# Patient Record
Sex: Female | Born: 1975 | Race: White | Hispanic: No | Marital: Single | Smoking: Current some day smoker
Health system: Southern US, Community
[De-identification: ages and names within clinical notes are randomized; demographics above are authoritative.]

## PROBLEM LIST (undated history)

## (undated) ENCOUNTER — Inpatient Hospital Stay (HOSPITAL_COMMUNITY): Payer: Self-pay

## (undated) DIAGNOSIS — R4586 Emotional lability: Secondary | ICD-10-CM

## (undated) DIAGNOSIS — Z9884 Bariatric surgery status: Secondary | ICD-10-CM

## (undated) DIAGNOSIS — R569 Unspecified convulsions: Secondary | ICD-10-CM

## (undated) DIAGNOSIS — I10 Essential (primary) hypertension: Secondary | ICD-10-CM

## (undated) DIAGNOSIS — S82899A Other fracture of unspecified lower leg, initial encounter for closed fracture: Secondary | ICD-10-CM

## (undated) DIAGNOSIS — F419 Anxiety disorder, unspecified: Secondary | ICD-10-CM

## (undated) DIAGNOSIS — Z5189 Encounter for other specified aftercare: Secondary | ICD-10-CM

## (undated) HISTORY — DX: Anxiety disorder, unspecified: F41.9

## (undated) HISTORY — DX: Emotional lability: R45.86

## (undated) HISTORY — PX: GASTRIC BYPASS: SHX52

## (undated) HISTORY — PX: WISDOM TOOTH EXTRACTION: SHX21

---

## 1998-08-01 ENCOUNTER — Other Ambulatory Visit: Admission: RE | Admit: 1998-08-01 | Discharge: 1998-08-01 | Payer: Self-pay | Admitting: Obstetrics and Gynecology

## 1998-11-03 ENCOUNTER — Inpatient Hospital Stay (HOSPITAL_COMMUNITY): Admission: AD | Admit: 1998-11-03 | Discharge: 1998-11-03 | Payer: Self-pay | Admitting: Obstetrics and Gynecology

## 1998-11-03 ENCOUNTER — Encounter: Payer: Self-pay | Admitting: Obstetrics and Gynecology

## 1998-11-30 ENCOUNTER — Inpatient Hospital Stay (HOSPITAL_COMMUNITY): Admission: AD | Admit: 1998-11-30 | Discharge: 1998-11-30 | Payer: Self-pay | Admitting: Obstetrics and Gynecology

## 1999-02-22 ENCOUNTER — Inpatient Hospital Stay (HOSPITAL_COMMUNITY): Admission: AD | Admit: 1999-02-22 | Discharge: 1999-02-25 | Payer: Self-pay | Admitting: Obstetrics and Gynecology

## 1999-11-14 ENCOUNTER — Emergency Department (HOSPITAL_COMMUNITY): Admission: EM | Admit: 1999-11-14 | Discharge: 1999-11-14 | Payer: Self-pay | Admitting: Emergency Medicine

## 2000-02-08 ENCOUNTER — Other Ambulatory Visit: Admission: RE | Admit: 2000-02-08 | Discharge: 2000-02-08 | Payer: Self-pay | Admitting: Obstetrics and Gynecology

## 2000-02-17 ENCOUNTER — Emergency Department (HOSPITAL_COMMUNITY): Admission: EM | Admit: 2000-02-17 | Discharge: 2000-02-17 | Payer: Self-pay | Admitting: Emergency Medicine

## 2000-09-09 ENCOUNTER — Emergency Department (HOSPITAL_COMMUNITY): Admission: EM | Admit: 2000-09-09 | Discharge: 2000-09-09 | Payer: Self-pay | Admitting: Emergency Medicine

## 2000-10-03 ENCOUNTER — Emergency Department (HOSPITAL_COMMUNITY): Admission: EM | Admit: 2000-10-03 | Discharge: 2000-10-03 | Payer: Self-pay | Admitting: Emergency Medicine

## 2003-03-25 ENCOUNTER — Other Ambulatory Visit: Admission: RE | Admit: 2003-03-25 | Discharge: 2003-03-25 | Payer: Self-pay | Admitting: Obstetrics and Gynecology

## 2005-06-11 ENCOUNTER — Other Ambulatory Visit: Admission: RE | Admit: 2005-06-11 | Discharge: 2005-06-11 | Payer: Self-pay | Admitting: Obstetrics and Gynecology

## 2013-04-20 ENCOUNTER — Emergency Department (HOSPITAL_COMMUNITY)
Admission: EM | Admit: 2013-04-20 | Discharge: 2013-04-20 | Disposition: A | Discharge status: Home / Self Care | Payer: Self-pay

## 2013-04-20 ENCOUNTER — Emergency Department (HOSPITAL_COMMUNITY)
Admission: EM | Admit: 2013-04-20 | Discharge: 2013-04-20 | Disposition: A | Discharge status: Home / Self Care | Attending: Emergency Medicine | Admitting: Emergency Medicine

## 2013-04-20 ENCOUNTER — Encounter (HOSPITAL_COMMUNITY): Payer: Self-pay | Admitting: Emergency Medicine

## 2013-04-20 DIAGNOSIS — R51 Headache: Secondary | ICD-10-CM | POA: Insufficient documentation

## 2013-04-20 DIAGNOSIS — Z8669 Personal history of other diseases of the nervous system and sense organs: Secondary | ICD-10-CM | POA: Insufficient documentation

## 2013-04-20 DIAGNOSIS — F17200 Nicotine dependence, unspecified, uncomplicated: Secondary | ICD-10-CM | POA: Insufficient documentation

## 2013-04-20 HISTORY — DX: Unspecified convulsions: R56.9

## 2013-04-20 MED ORDER — HYDROCODONE-ACETAMINOPHEN 5-325 MG PO TABS: 1.0000 | ORAL_TABLET | Freq: Four times a day (QID) | ORAL | Status: DC | PRN

## 2013-04-20 NOTE — ED Notes (Signed)
Pt presenting to ed with c/o right side upper toothache pain x 24 hours

## 2013-04-20 NOTE — ED Provider Notes (Signed)
History    This chart was scribed for non-physician practitioner, Lucretia Kern, working with Derwood Kaplan, MD by Donne Anon, ED Scribe. This patient was seen in room WTR8/WTR8 and the patient's care was started at 1625.  CSN: [id number] Arrival date & time 04/20/13  1539  First MD Initiated Contact with Patient 04/20/13 1625     Chief Complaint  Patient presents with  . Dental Pain    The history is provided by the patient. No language interpreter was used.   HPI Comments: Isabella Jenkins is a 37 y.o. female with hx of TMJ and seizures, who presents to the Emergency Department complaining of 2 days of gradual onset, gradually worsening, waxing and waning, right upper dental pain described as throbbing and radiating down into her lower jaw towards her chin. She reports that this pain feels different than her TMJ. The pain is worse when she bites down. Pt denies taking OTC medications at home to improve symptoms. She denies fever, chills, congestion, rhinorrhea or any other pain.  Past Medical History  Diagnosis Date  . Seizures    Past Surgical History  Procedure Laterality Date  . Gastric bypass     No family history on file. History  Substance Use Topics  . Smoking status: Current Some Day Smoker  . Smokeless tobacco: Not on file  . Alcohol Use: No   Review of Systems  Constitutional: Negative for fever and chills.  HENT: Positive for dental problem. Negative for congestion and rhinorrhea.   All other systems reviewed and are negative.    Allergies  Ibuprofen  Home Medications  No current outpatient prescriptions on file.  Triage Vitals; BP 133/86  Pulse 88  Temp(Src) 98.6 F (37 C) (Oral)  Resp 18  SpO2 100%  LMP 04/17/2013  Physical Exam  Nursing note and vitals reviewed. Constitutional: She appears well-developed and well-nourished. No distress.  HENT:  Head: Normocephalic and atraumatic.  Right Ear: Tympanic membrane, external ear and  ear canal normal.  Left Ear: Tympanic membrane, external ear and ear canal normal.  Mouth/Throat: Uvula is midline and oropharynx is clear and moist. Normal dentition. No oropharyngeal exudate.   No TMJ tenderness. Good dentition. No obvious cavities. Tender to palpation over right upper molars. No signs of infection. No trismus  Eyes: Conjunctivae are normal. Pupils are equal, round, and reactive to light.  Neck: Neck supple. No tracheal deviation present.  Cardiovascular: Normal rate.   Pulmonary/Chest: Effort normal. No respiratory distress.  Musculoskeletal: Normal range of motion.  Neurological: She is alert.  Skin: Skin is warm and dry.  Psychiatric: She has a normal mood and affect. Her behavior is normal.    ED Course  Procedures (including critical care time) DIAGNOSTIC STUDIES: Oxygen Saturation is 100% on RA, normal by my interpretation.    COORDINATION OF CARE: 5:10 PM Discussed treatment plan which includes pain medication with pt at bedside and pt agreed to plan.     Labs Reviewed - No data to display No results found.   1. Facial pain     MDM  Pt with right facial pain, hx of TMJ and joint surgeries in the past. No signs of dental infction. No signs of TMJ infection. No trismus. She actually does not have any TMJ tenderness but states that her pain presents differently from typical TMJ symptoms. Will provide pain medications. Pt is from [place], will go back in 2 days, follow up with ENT there.   Filed Vitals:  04/20/13 1604  BP: 133/86  Pulse: 88  Temp: 98.6 F (37 C)  TempSrc: Oral  Resp: 18  SpO2: 100%     I personally performed the services described in this documentation, which was scribed in my presence. The recorded information has been reviewed and is accurate.   Lottie Mussel, PA-C 04/20/13 1728

## 2013-04-21 NOTE — ED Provider Notes (Signed)
Medical screening examination/treatment/procedure(s) were performed by non-physician practitioner and as supervising physician I was immediately available for consultation/collaboration.  Katelin Kutsch, MD 04/21/13 0108 

## 2013-05-15 ENCOUNTER — Encounter (HOSPITAL_COMMUNITY): Payer: Self-pay

## 2013-05-15 ENCOUNTER — Emergency Department (HOSPITAL_COMMUNITY)
Admission: EM | Admit: 2013-05-15 | Discharge: 2013-05-15 | Disposition: A | Discharge status: Home / Self Care | Attending: Emergency Medicine | Admitting: Emergency Medicine

## 2013-05-15 DIAGNOSIS — Z79899 Other long term (current) drug therapy: Secondary | ICD-10-CM | POA: Insufficient documentation

## 2013-05-15 DIAGNOSIS — IMO0002 Reserved for concepts with insufficient information to code with codable children: Secondary | ICD-10-CM | POA: Insufficient documentation

## 2013-05-15 DIAGNOSIS — R569 Unspecified convulsions: Secondary | ICD-10-CM | POA: Insufficient documentation

## 2013-05-15 DIAGNOSIS — M5416 Radiculopathy, lumbar region: Secondary | ICD-10-CM

## 2013-05-15 DIAGNOSIS — R300 Dysuria: Secondary | ICD-10-CM | POA: Insufficient documentation

## 2013-05-15 DIAGNOSIS — F17200 Nicotine dependence, unspecified, uncomplicated: Secondary | ICD-10-CM | POA: Insufficient documentation

## 2013-05-15 DIAGNOSIS — Z3202 Encounter for pregnancy test, result negative: Secondary | ICD-10-CM | POA: Insufficient documentation

## 2013-05-15 DIAGNOSIS — M549 Dorsalgia, unspecified: Secondary | ICD-10-CM | POA: Insufficient documentation

## 2013-05-15 LAB — URINALYSIS, ROUTINE W REFLEX MICROSCOPIC
Bilirubin Urine: NEGATIVE
Glucose, UA: NEGATIVE mg/dL
Ketones, ur: 15 mg/dL — AB
Leukocytes, UA: NEGATIVE
Nitrite: NEGATIVE
Protein, ur: NEGATIVE mg/dL
Specific Gravity, Urine: 1.023 (ref 1.005–1.030)
Urobilinogen, UA: 1 mg/dL (ref 0.0–1.0)
pH: 6 (ref 5.0–8.0)

## 2013-05-15 LAB — URINE MICROSCOPIC-ADD ON

## 2013-05-15 MED ORDER — KETOROLAC TROMETHAMINE 30 MG/ML IJ SOLN
30.0000 mg | Freq: Once | INTRAMUSCULAR | Status: AC
  Administered 2013-05-15: 30 mg via INTRAMUSCULAR
  Filled 2013-05-15: qty 1

## 2013-05-15 MED ORDER — HYDROCODONE-ACETAMINOPHEN 5-325 MG PO TABS: 1.0000 | ORAL_TABLET | ORAL | Status: DC | PRN

## 2013-05-15 MED ORDER — CYCLOBENZAPRINE HCL 10 MG PO TABS: 10.0000 mg | ORAL_TABLET | Freq: Three times a day (TID) | ORAL | Status: DC | PRN

## 2013-05-15 NOTE — ED Provider Notes (Signed)
History    CSN: [id number] Arrival date & time 05/15/13  1478  First MD Initiated Contact with Patient 05/15/13 0053     Chief Complaint  Patient presents with  . Flank Pain   HPI  History provided by the patient. The patient is a 37 year old female with history of gastric bypass surgery and seizure disorder who presents with complaints of gradually worsening right flank and back pain with radiation into her right anterior thigh. Symptoms began earlier today and have been worsening. Patient has been using Tylenol at home without any significant improvement. She denies having similar symptoms previously. No recent injury or trauma. Denies any specific strenuous activity or heavy lifting. Pain and numbness going to the anterior thigh and stops at the knee. She denies any weakness in the foot or lower leg. Denies any rash or skin changes. No recent fever, chills or sweats. No unintentional weight changes. Patient also mentions having occasional pressure and dysuria. Denies any urinary frequency or hematuria. No prior history of kidney stone. Patient is currently menstruating. No nausea no vomiting. No urinary fecal incontinence, no urinary retention or perineal numbness.    Past Medical History  Diagnosis Date  . Seizures    Past Surgical History  Procedure Laterality Date  . Gastric bypass     No family history on file. History  Substance Use Topics  . Smoking status: Current Some Day Smoker  . Smokeless tobacco: Not on file  . Alcohol Use: No   OB History   Grav Para Term Preterm Abortions TAB SAB Ect Mult Living                 Review of Systems  Constitutional: Negative for fever, chills, diaphoresis and unexpected weight change.  Genitourinary: Positive for dysuria and flank pain. Negative for frequency, hematuria, vaginal bleeding, vaginal discharge and menstrual problem.  Musculoskeletal: Positive for back pain.  All other systems reviewed and are  negative.    Allergies  Ibuprofen and Tramadol  Home Medications   Current Outpatient Rx  Name  Route  Sig  Dispense  Refill  . divalproex (DEPAKOTE ER) 500 MG 24 hr tablet   Oral   Take 1,500 mg by mouth every evening.          BP 166/79  Pulse 100  Temp(Src) 98.2 F (36.8 C) (Oral)  Resp 18  SpO2 100%  LMP 05/15/2013 Physical Exam  Nursing note and vitals reviewed. Constitutional: She is oriented to person, place, and time. She appears well-developed and well-nourished. No distress.  HENT:  Head: Normocephalic.  Cardiovascular: Normal rate and regular rhythm.   Pulmonary/Chest: Effort normal and breath sounds normal. No respiratory distress. She has no wheezes. She has no rales.  Abdominal: Soft. She exhibits no distension. There is no tenderness. There is no rebound and no guarding.  No CVA tenderness  Musculoskeletal: Normal range of motion. She exhibits no edema.       Cervical back: Normal.       Thoracic back: Normal.       Lumbar back: Normal. She exhibits no tenderness.  Mild tenderness over the anterior right thigh area. No gross deformity. Skin normal. Normal full range of motion of hip and knee. Normal dorsal pedal pulses. Normal sensation in foot. Normal strength in the feet and lower legs.  Neurological: She is alert and oriented to person, place, and time.  Skin: Skin is warm and dry. No rash noted.  Psychiatric: She has a normal mood  and affect. Her behavior is normal.    ED Course  Procedures   Results for orders placed during the hospital encounter of 05/15/13  URINALYSIS, ROUTINE W REFLEX MICROSCOPIC      Result Value Range   Color, Urine YELLOW  YELLOW   APPearance CLOUDY (*) CLEAR   Specific Gravity, Urine 1.023  1.005 - 1.030   pH 6.0  5.0 - 8.0   Glucose, UA NEGATIVE  NEGATIVE mg/dL   Hgb urine dipstick MODERATE (*) NEGATIVE   Bilirubin Urine NEGATIVE  NEGATIVE   Ketones, ur 15 (*) NEGATIVE mg/dL   Protein, ur NEGATIVE  NEGATIVE mg/dL    Urobilinogen, UA 1.0  0.0 - 1.0 mg/dL   Nitrite NEGATIVE  NEGATIVE   Leukocytes, UA NEGATIVE  NEGATIVE  URINE MICROSCOPIC-ADD ON      Result Value Range   Squamous Epithelial / LPF RARE  RARE   WBC, UA 3-6  <3 WBC/hpf   RBC / HPF 7-10  <3 RBC/hpf   Bacteria, UA RARE  RARE   Urine-Other MUCOUS PRESENT    POCT PREGNANCY, URINE      Result Value Range   Preg Test, Ur NEGATIVE  NEGATIVE      1. Lumbar radicular pain     MDM  Patient seen and evaluated. Patient appears in mild discomfort no acute distress. No specific injury or trauma.  Patient is currently menstruating. Symptoms appear consistent with kidney stone with radiation to the right side. She has no nausea no vomiting. This time suspect musculoskeletal cause. Patient will be provided resources for outpatient followup. We'll treat symptomatically.   Angus Seller, PA-C 05/15/13 [phone]

## 2013-05-15 NOTE — ED Notes (Signed)
Pt. Reports right flank pain, burning with urination and numbness/tingling down right leg. Pedal pulse intact, sensation intact.

## 2013-05-15 NOTE — ED Provider Notes (Signed)
Medical screening examination/treatment/procedure(s) were performed by non-physician practitioner and as supervising physician I was immediately available for consultation/collaboration.   Brandt Loosen, MD 05/15/13 [phone]

## 2013-08-22 ENCOUNTER — Emergency Department (HOSPITAL_COMMUNITY)
Admission: EM | Admit: 2013-08-22 | Discharge: 2013-08-22 | Disposition: A | Discharge status: Home / Self Care | Attending: Emergency Medicine | Admitting: Emergency Medicine

## 2013-08-22 ENCOUNTER — Encounter (HOSPITAL_COMMUNITY): Payer: Self-pay | Admitting: Emergency Medicine

## 2013-08-22 DIAGNOSIS — Q759 Congenital malformation of skull and face bones, unspecified: Secondary | ICD-10-CM | POA: Insufficient documentation

## 2013-08-22 DIAGNOSIS — K0889 Other specified disorders of teeth and supporting structures: Secondary | ICD-10-CM

## 2013-08-22 DIAGNOSIS — Z760 Encounter for issue of repeat prescription: Secondary | ICD-10-CM | POA: Insufficient documentation

## 2013-08-22 DIAGNOSIS — K029 Dental caries, unspecified: Secondary | ICD-10-CM | POA: Insufficient documentation

## 2013-08-22 DIAGNOSIS — G40909 Epilepsy, unspecified, not intractable, without status epilepticus: Secondary | ICD-10-CM | POA: Insufficient documentation

## 2013-08-22 DIAGNOSIS — K089 Disorder of teeth and supporting structures, unspecified: Secondary | ICD-10-CM | POA: Insufficient documentation

## 2013-08-22 DIAGNOSIS — F17200 Nicotine dependence, unspecified, uncomplicated: Secondary | ICD-10-CM | POA: Insufficient documentation

## 2013-08-22 MED ORDER — HYDROCODONE-ACETAMINOPHEN 5-325 MG PO TABS: ORAL_TABLET | ORAL | Status: DC

## 2013-08-22 MED ORDER — DIVALPROEX SODIUM ER 500 MG PO TB24: 1500.0000 mg | ORAL_TABLET | Freq: Every evening | ORAL | Status: DC

## 2013-08-22 NOTE — ED Notes (Signed)
Patient states recently moved to [facility] and took last dose of anti seizure medication yesterday and would like a refill until patient gets a Librarian, academic in [place].

## 2013-08-22 NOTE — ED Notes (Signed)
Pt reports she was eating chewy candy this am and felt something hard near her back L tooth where she had a filling and now she is having pain in that tooth. Also she ran out of seizure meds yesterday, she just moved here from [facility] and her doctor there was unable to call in a refill for her.

## 2013-08-22 NOTE — ED Provider Notes (Signed)
CSN: [id number]     Arrival date & time 08/22/13  1129 History  This chart was scribed for non-physician practitioner Rhea Bleacher PA-C working with Gerhard Munch, MD by Leone Payor, ED Scribe. This patient was seen in room TR05C/TR05C and the patient's care was started at 1129.    Chief Complaint  Patient presents with  . Dental Pain    The history is provided by the patient. No language interpreter was used.    HPI Comments: Isabella Jenkins is a 37 y.o. female who presents to the Emergency Department complaining of sudden onset, gradually worsening left upper dental pain that began this morning. Pt states she has a filling and was eating a Starburst candy when the filling came out. She states having gradually worsening pain. She has taken tylenol without relief. She denies facial swelling.   Pt also states she had a history of seizures and takes Depakote. She states she recently moved from [place] and has been unable to get a refill for Depakote.  She requests short-term refill and anticipates getting refill from PCP early next week.    Past Medical History  Diagnosis Date  . Seizures    Past Surgical History  Procedure Laterality Date  . Gastric bypass     History reviewed. No pertinent family history. History  Substance Use Topics  . Smoking status: Current Some Day Smoker  . Smokeless tobacco: Not on file  . Alcohol Use: No   OB History   Grav Para Term Preterm Abortions TAB SAB Ect Mult Living                 Review of Systems  Constitutional: Negative for fever.  HENT: Positive for dental problem. Negative for ear pain, facial swelling, sore throat and trouble swallowing.   Respiratory: Negative for shortness of breath and stridor.   Musculoskeletal: Negative for neck pain.  Skin: Negative for color change.  Neurological: Negative for headaches.    Allergies  Ibuprofen and Tramadol  Home Medications   Current Outpatient Rx  Name  Route  Sig  Dispense  Refill   . divalproex (DEPAKOTE ER) 500 MG 24 hr tablet   Oral   Take 1,500 mg by mouth every evening.          BP 150/100  Pulse 108  Temp(Src) 97.7 F (36.5 C) (Oral)  Resp 20  SpO2 100% Physical Exam  Nursing note and vitals reviewed. Constitutional: She appears well-developed and well-nourished. No distress.  HENT:  Head: Atraumatic. Macrocephalic.  Right Ear: Tympanic membrane, external ear and ear canal normal.  Left Ear: Tympanic membrane, external ear and ear canal normal.  Nose: Nose normal.  Mouth/Throat: Uvula is midline, oropharynx is clear and moist and mucous membranes are normal. No trismus in the jaw. Abnormal dentition. Dental caries present. No dental abscesses or uvula swelling. No tonsillar abscesses.  Left upper second molar has filling in place. No obvious defect in tooth. No gum swelling.   Eyes: Conjunctivae and EOM are normal.  Neck: Normal range of motion. Neck supple.  No neck swelling or Ludwig's angina  Pulmonary/Chest: Effort normal.  Musculoskeletal: Normal range of motion.  Lymphadenopathy:    She has no cervical adenopathy.  Neurological: She is alert.  Skin: Skin is warm and dry. No rash noted. She is not diaphoretic.  Psychiatric: She has a normal mood and affect. Her behavior is normal.    ED Course  Procedures   DIAGNOSTIC STUDIES: Oxygen Saturation is 100% on  RA, normal by my interpretation.    COORDINATION OF CARE: 11:48 AM Discussed treatment plan with pt at bedside and pt agreed to plan.    Labs Review Labs Reviewed - No data to display Imaging Review No results found.  EKG Interpretation   None       Patient seen and examined. Work-up initiated.   Vital signs reviewed and are as follows: Filed Vitals:   08/22/13 1133  BP: 150/100  Pulse: 108  Temp: 97.7 F (36.5 C)  Resp: 20    Patient counseled to take prescribed medications as directed, return with worsening facial or neck swelling, and to follow-up with their  dentist as soon as possible.   Patient counseled on use of narcotic pain medications. Counseled not to combine these medications with others containing tylenol. Urged not to drink alcohol, drive, or perform any other activities that requires focus while taking these medications. The patient verbalizes understanding and agrees with the plan.    MDM   1. Pain, dental   2. Medication refill    Patient with toothache.  No gross abscess.  Exam unconcerning for Ludwig's angina or other deep tissue infection in neck.  Will treat with pain medicine.  Urged patient to follow-up with dentist.  Patient provided with one-week course of previously prescribed Depakote to prevent seizures. Pt is concerned that she have a seizure if she misses even one dose.   I personally performed the services described in this documentation, which was scribed in my presence. The recorded information has been reviewed and is accurate.     Renne Crigler, PA-C 08/22/13 1550

## 2013-08-23 NOTE — ED Provider Notes (Signed)
  Medical screening examination/treatment/procedure(s) were performed by non-physician practitioner and as supervising physician I was immediately available for consultation/collaboration.      Gerhard Munch, MD 08/23/13 [phone]

## 2013-08-31 ENCOUNTER — Ambulatory Visit: Admitting: Diagnostic Neuroimaging

## 2013-10-15 ENCOUNTER — Encounter (HOSPITAL_COMMUNITY): Payer: Self-pay | Admitting: Emergency Medicine

## 2013-10-15 ENCOUNTER — Emergency Department (HOSPITAL_COMMUNITY)

## 2013-10-15 ENCOUNTER — Emergency Department (HOSPITAL_COMMUNITY)
Admission: EM | Admit: 2013-10-15 | Discharge: 2013-10-15 | Disposition: A | Discharge status: Home / Self Care | Attending: Emergency Medicine | Admitting: Emergency Medicine

## 2013-10-15 DIAGNOSIS — Z792 Long term (current) use of antibiotics: Secondary | ICD-10-CM | POA: Insufficient documentation

## 2013-10-15 DIAGNOSIS — Y939 Activity, unspecified: Secondary | ICD-10-CM | POA: Insufficient documentation

## 2013-10-15 DIAGNOSIS — S8392XA Sprain of unspecified site of left knee, initial encounter: Secondary | ICD-10-CM

## 2013-10-15 DIAGNOSIS — W1809XA Striking against other object with subsequent fall, initial encounter: Secondary | ICD-10-CM | POA: Insufficient documentation

## 2013-10-15 DIAGNOSIS — Y990 Civilian activity done for income or pay: Secondary | ICD-10-CM | POA: Insufficient documentation

## 2013-10-15 DIAGNOSIS — W11XXXA Fall on and from ladder, initial encounter: Secondary | ICD-10-CM | POA: Insufficient documentation

## 2013-10-15 DIAGNOSIS — G40909 Epilepsy, unspecified, not intractable, without status epilepticus: Secondary | ICD-10-CM | POA: Insufficient documentation

## 2013-10-15 DIAGNOSIS — F17200 Nicotine dependence, unspecified, uncomplicated: Secondary | ICD-10-CM | POA: Insufficient documentation

## 2013-10-15 DIAGNOSIS — IMO0002 Reserved for concepts with insufficient information to code with codable children: Secondary | ICD-10-CM | POA: Insufficient documentation

## 2013-10-15 DIAGNOSIS — Y929 Unspecified place or not applicable: Secondary | ICD-10-CM | POA: Insufficient documentation

## 2013-10-15 DIAGNOSIS — N390 Urinary tract infection, site not specified: Secondary | ICD-10-CM | POA: Insufficient documentation

## 2013-10-15 DIAGNOSIS — Z3202 Encounter for pregnancy test, result negative: Secondary | ICD-10-CM | POA: Insufficient documentation

## 2013-10-15 LAB — CBC WITH DIFFERENTIAL/PLATELET
Basophils Absolute: 0.1 10*3/uL (ref 0.0–0.1)
Basophils Relative: 1 % (ref 0–1)
Eosinophils Absolute: 0.3 10*3/uL (ref 0.0–0.7)
Eosinophils Relative: 4 % (ref 0–5)
MCH: 26.4 pg (ref 26.0–34.0)
MCHC: 32.1 g/dL (ref 30.0–36.0)
MCV: 82.4 fL (ref 78.0–100.0)
Monocytes Absolute: 0.4 10*3/uL (ref 0.1–1.0)
Platelets: 243 10*3/uL (ref 150–400)
RDW: 14.9 % (ref 11.5–15.5)
WBC: 6.4 10*3/uL (ref 4.0–10.5)

## 2013-10-15 LAB — URINALYSIS, ROUTINE W REFLEX MICROSCOPIC
Glucose, UA: NEGATIVE mg/dL
Nitrite: POSITIVE — AB
Protein, ur: 30 mg/dL — AB

## 2013-10-15 LAB — URINE MICROSCOPIC-ADD ON

## 2013-10-15 LAB — PREGNANCY, URINE: Preg Test, Ur: NEGATIVE

## 2013-10-15 MED ORDER — DIVALPROEX SODIUM ER 500 MG PO TB24: ORAL_TABLET | ORAL | Status: DC

## 2013-10-15 MED ORDER — CIPROFLOXACIN HCL 500 MG PO TABS: 500.0000 mg | ORAL_TABLET | Freq: Two times a day (BID) | ORAL | Status: DC

## 2013-10-15 NOTE — ED Notes (Signed)
Reports falling approx 3-4 ft off a ladder at work, having left knee pain. Ambulatory at triage.

## 2013-10-15 NOTE — ED Notes (Signed)
Pt given meal and drink per PA.

## 2013-10-15 NOTE — ED Notes (Signed)
Pt fell from shelving at work today, striking her left knee on shelves as she went down. No deformity. ALSO, pt gives urine specimen that is dark and cloudy. States she has had burning with urination x 2 weeks. Also states she has also had mid to lower back pain.

## 2013-10-15 NOTE — ED Provider Notes (Signed)
CSN: [id number]     Arrival date & time 10/15/13  1118 History  This chart was scribed for non-physician practitioner, Raymon Mutton, PA-C working with Raeford Razor, MD by Greggory Stallion, ED scribe. This patient was seen in room TR11C/TR11C and the patient's care was started at 2:49 PM.   Chief Complaint  Patient presents with  . Fall  . Knee Pain   The history is provided by the patient. No language interpreter was used.   HPI Comments: Isabella Jenkins is a 37 y.o. female who presents to the Emergency Department complaining of a fall that occurred earlier today. She states she fell approximately 3-4 feet off a ladder at work and hit a rack with her left knee. Pt has sudden onset aching left knee pain. Denies radiation. Straightening her leg worsens the pain. Pt states she is having chills, dysuria, back pain and suprapubic pain that started several weeks ago. She has not been seen for the UTI symptoms. Patient reported that the suprapubic pain is a pressure sensation. Stated that she developed these symptoms about a month ago and stated that she has not been seen by a physician secondary to just moving from [place]. Denies fever, chest pain, difficulty breathing, SOB, numbness or tingling in legs, loss of sensation.   Past Medical History  Diagnosis Date  . Seizures    Past Surgical History  Procedure Laterality Date  . Gastric bypass     History reviewed. No pertinent family history. History  Substance Use Topics  . Smoking status: Current Some Day Smoker  . Smokeless tobacco: Not on file  . Alcohol Use: No   OB History   Grav Para Term Preterm Abortions TAB SAB Ect Mult Living                 Review of Systems  Constitutional: Positive for chills. Negative for fever.  Respiratory: Negative for shortness of breath.   Cardiovascular: Negative for chest pain.  Genitourinary: Positive for dysuria and pelvic pain.  Musculoskeletal: Positive for arthralgias and back pain.   Neurological: Negative for numbness.  All other systems reviewed and are negative.    Allergies  Ibuprofen and Tramadol  Home Medications   Current Outpatient Rx  Name  Route  Sig  Dispense  Refill  . acetaminophen (TYLENOL) 325 MG tablet   Oral   Take 650 mg by mouth every 6 (six) hours as needed for mild pain.         . divalproex (DEPAKOTE ER) 500 MG 24 hr tablet   Oral   Take 3 tablets (1,500 mg total) by mouth every evening.   21 tablet   0   . Phenazopyridine HCl (AZO TABS PO)   Oral   Take 2 tablets by mouth daily as needed (bladder pain).         . ciprofloxacin (CIPRO) 500 MG tablet   Oral   Take 1 tablet (500 mg total) by mouth 2 (two) times daily.   14 tablet   0   . divalproex (DEPAKOTE ER) 500 MG 24 hr tablet      Take 3 tablets (1,500 mg total) by mouth every evening   30 tablet   0    BP 139/89  Pulse 90  Temp(Src) 97.3 F (36.3 C) (Oral)  Resp 18  Ht 5\' 4"  (1.626 m)  Wt 165 lb 3 oz (74.929 kg)  BMI 28.34 kg/m2  SpO2 100%  LMP 09/28/2013  Physical Exam  Nursing note and  vitals reviewed. Constitutional: She is oriented to person, place, and time. She appears well-developed and well-nourished. No distress.  HENT:  Head: Normocephalic and atraumatic.  Eyes: EOM are normal.  Neck: Neck supple. No tracheal deviation present.  Cardiovascular: Normal rate, regular rhythm and normal heart sounds.  Exam reveals no gallop and no friction rub.   No murmur heard. Pulmonary/Chest: Effort normal and breath sounds normal. No respiratory distress. She has no wheezes. She has no rales.  Abdominal: Soft. Bowel sounds are normal. There is tenderness. There is no guarding and no CVA tenderness.    Suprapubic tenderness upon palpation   Musculoskeletal: Normal range of motion.  Negative swelling, erythema, inflammation, deformities, ecchymosis noted to the left knee. Pain upon palpation to the left knee circumferential. Discomfort with extension, full  flexion noted. Negative valgus varus tension. Negative anterior posterior drawer sign. Stable left knee joint.   Neurological: She is alert and oriented to person, place, and time. She exhibits normal muscle tone. Coordination normal.  Strength 5+/5+ to bilateral lower extremities with resistance applied and equal distribution. Sensation intact with differentiation to sharp and dull touch to lower extremities bilaterally.   Skin: Skin is warm and dry.  Psychiatric: She has a normal mood and affect. Her behavior is normal.    ED Course  Procedures (including critical care time)  DIAGNOSTIC STUDIES: Oxygen Saturation is 100% on RA, normal by my interpretation.    COORDINATION OF CARE: 2:55 PM-Discussed treatment plan which includes an antibiotic with pt at bedside and pt agreed to plan.   3:02 PM Patient requesting Depakote medications to be refilled. Discussed with patient will need to obtain blood to check for levels to make sure she is not overdosing, patient understood.   Results for orders placed during the hospital encounter of 10/15/13  URINALYSIS, ROUTINE W REFLEX MICROSCOPIC      Result Value Range   Color, Urine AMBER (*) YELLOW   APPearance CLOUDY (*) CLEAR   Specific Gravity, Urine 1.033 (*) 1.005 - 1.030   pH 5.5  5.0 - 8.0   Glucose, UA NEGATIVE  NEGATIVE mg/dL   Hgb urine dipstick NEGATIVE  NEGATIVE   Bilirubin Urine SMALL (*) NEGATIVE   Ketones, ur 15 (*) NEGATIVE mg/dL   Protein, ur 30 (*) NEGATIVE mg/dL   Urobilinogen, UA 1.0  0.0 - 1.0 mg/dL   Nitrite POSITIVE (*) NEGATIVE   Leukocytes, UA MODERATE (*) NEGATIVE  PREGNANCY, URINE      Result Value Range   Preg Test, Ur NEGATIVE  NEGATIVE  URINE MICROSCOPIC-ADD ON      Result Value Range   Squamous Epithelial / LPF MANY (*) RARE   WBC, UA 21-50  <3 WBC/hpf   RBC / HPF 0-2  <3 RBC/hpf   Bacteria, UA MANY (*) RARE   Urine-Other MUCOUS PRESENT    VALPROIC ACID LEVEL      Result Value Range   Valproic Acid Lvl  55.4  50.0 - 100.0 ug/mL  CBC WITH DIFFERENTIAL      Result Value Range   WBC 6.4  4.0 - 10.5 K/uL   RBC 4.20  3.87 - 5.11 MIL/uL   Hemoglobin 11.1 (*) 12.0 - 15.0 g/dL   HCT 40.9 (*) 81.1 - 91.4 %   MCV 82.4  78.0 - 100.0 fL   MCH 26.4  26.0 - 34.0 pg   MCHC 32.1  30.0 - 36.0 g/dL   RDW 78.2  95.6 - 21.3 %   Platelets 243  150 - 400 K/uL   Neutrophils Relative % 48  43 - 77 %   Neutro Abs 3.1  1.7 - 7.7 K/uL   Lymphocytes Relative 40  12 - 46 %   Lymphs Abs 2.6  0.7 - 4.0 K/uL   Monocytes Relative 6  3 - 12 %   Monocytes Absolute 0.4  0.1 - 1.0 K/uL   Eosinophils Relative 4  0 - 5 %   Eosinophils Absolute 0.3  0.0 - 0.7 K/uL   Basophils Relative 1  0 - 1 %   Basophils Absolute 0.1  0.0 - 0.1 K/uL   Dg Knee Complete 4 Views Left  10/15/2013   CLINICAL DATA:  Fall.  EXAM: LEFT KNEE - COMPLETE 4+ VIEW  COMPARISON:  None.  FINDINGS: There is no evidence of fracture, dislocation, or joint effusion. There is no evidence of arthropathy or other focal bone abnormality. Soft tissues are unremarkable.  IMPRESSION: Negative.   Electronically Signed   By: Maisie Fus  Register   On: 10/15/2013 13:07   Labs Review Labs Reviewed  URINALYSIS, ROUTINE W REFLEX MICROSCOPIC - Abnormal; Notable for the following:    Color, Urine AMBER (*)    APPearance CLOUDY (*)    Specific Gravity, Urine 1.033 (*)    Bilirubin Urine SMALL (*)    Ketones, ur 15 (*)    Protein, ur 30 (*)    Nitrite POSITIVE (*)    Leukocytes, UA MODERATE (*)    All other components within normal limits  URINE MICROSCOPIC-ADD ON - Abnormal; Notable for the following:    Squamous Epithelial / LPF MANY (*)    Bacteria, UA MANY (*)    All other components within normal limits  CBC WITH DIFFERENTIAL - Abnormal; Notable for the following:    Hemoglobin 11.1 (*)    HCT 34.6 (*)    All other components within normal limits  URINE CULTURE  PREGNANCY, URINE  VALPROIC ACID LEVEL   Imaging Review Dg Knee Complete 4 Views  Left  10/15/2013   CLINICAL DATA:  Fall.  EXAM: LEFT KNEE - COMPLETE 4+ VIEW  COMPARISON:  None.  FINDINGS: There is no evidence of fracture, dislocation, or joint effusion. There is no evidence of arthropathy or other focal bone abnormality. Soft tissues are unremarkable.  IMPRESSION: Negative.   Electronically Signed   By: Maisie Fus  Register   On: 10/15/2013 13:07    EKG Interpretation   None       MDM   1. UTI (urinary tract infection)   2. Knee sprain, left, initial encounter    Filed Vitals:   10/15/13 1123 10/15/13 1558  BP: 139/89 112/66  Pulse: 90 89  Temp: 97.3 F (36.3 C)   TempSrc: Oral   Resp: 18 18  Height: 5\' 4"  (1.626 m)   Weight: 165 lb 3 oz (74.929 kg)   SpO2: 100% 100%   I personally performed the services described in this documentation, which was scribed in my presence. The recorded information has been reviewed and is accurate.  Patient presenting to the ED with left knee pain after allegedly slipping off the shelving at work and landing on her left knee. Patient reported that she has been having dysuria for one month. Reported that she recently moved from [place] and has not been assessed regarding her dysuria. Patient requesting depakote to be refilled. Alert and oriented. GCS 15. Heart rate and rhythm normal. Lungs clear to auscultation to upper and lower lobes. Pulses palpable and  strong, radial and DP 2+ bilaterally. BS normoactive in all 4 quadrants, soft. Discomfort upon palpation to the suprapubic region. Negative acute abdomen, negative peritoneal signs. Negative swelling, erythema, inflammation, lesions, deformities noted to the left knee. Full flexion, pain with extension. Pain upon palpation circumferential to the left knee. Patella intact. Negative valgus, varus. Negative bilateral CVA tenderness.  CBC negative elevated WBC. Depakote within normal limits. UA noted UTI - positive nitrites with moderate leukocytes, WBC of 21-50. Left knee plain film  negative for fractures or dislocations.  Patient stable, afebrile. Negative elevation in WBC - patient not septic. Discharged patient with depakote and antibiotics. Discussed with patient to rest and stay hydrated. Patient placed in knee sleeve for comfort. Referred patient to Urgent Care Center - discussed with patient that she needs to be re-assessed by Urgent Care center regarding depakote levels to be monitored and controlled. Discussed with patient to continue to monitor symptoms and if symptoms are to worsen or change to report back to the ED -strict return instructions given.  Patient agreed to plan of care, understood, all questions answered.    Raymon Mutton, PA-C 10/15/13 1714

## 2013-10-15 NOTE — ED Notes (Signed)
[facility]le[facility] called to draw labs

## 2013-10-16 NOTE — ED Provider Notes (Signed)
Medical screening examination/treatment/procedure(s) were performed by non-physician practitioner and as supervising physician I was immediately available for consultation/collaboration.  EKG Interpretation   None        Shawndell Varas, MD 10/16/13 0750 

## 2013-10-17 LAB — URINE CULTURE: Colony Count: >=100000

## 2013-10-29 NOTE — L&D Delivery Note (Signed)
Delivery Note Patient pushed for 5 minutes after she was noted to be C /C/ +3. At 9:00 PM a viable and healthy female was delivered via Vaginal, Spontaneous Delivery (Presentation: ; Occiput Anterior).  APGAR: 9/9 weight  pending .   Placenta status: Intact, Spontaneous.  Cord: 2 vessels with the following complications: None.  There was a slow trickle of blood and with her previous h/o post partum hemorrhage, cytotec 1047mcg was placed per rectum and  Her bladder emptied with a red rubber catheter. Her fundus was noted to be firm and hemostasis achieved.   Anesthesia: Epidural  Episiotomy: None Lacerations: None Est. Blood Loss (mL): 400  Mom to postpartum.  Baby to Couplet care / Skin to Skin.  Richerd Grime, [place] 10/25/2014, 9:28 PM

## 2013-11-07 ENCOUNTER — Emergency Department (HOSPITAL_COMMUNITY)
Admission: EM | Admit: 2013-11-07 | Discharge: 2013-11-07 | Disposition: A | Discharge status: Home / Self Care | Attending: Emergency Medicine | Admitting: Emergency Medicine

## 2013-11-07 ENCOUNTER — Encounter (HOSPITAL_COMMUNITY): Payer: Self-pay | Admitting: Emergency Medicine

## 2013-11-07 DIAGNOSIS — Z760 Encounter for issue of repeat prescription: Secondary | ICD-10-CM

## 2013-11-07 DIAGNOSIS — Z79899 Other long term (current) drug therapy: Secondary | ICD-10-CM | POA: Insufficient documentation

## 2013-11-07 DIAGNOSIS — F17200 Nicotine dependence, unspecified, uncomplicated: Secondary | ICD-10-CM | POA: Insufficient documentation

## 2013-11-07 DIAGNOSIS — G40909 Epilepsy, unspecified, not intractable, without status epilepticus: Secondary | ICD-10-CM | POA: Insufficient documentation

## 2013-11-07 MED ORDER — DIVALPROEX SODIUM ER 500 MG PO TB24: 1500.0000 mg | ORAL_TABLET | Freq: Every evening | ORAL | Status: DC

## 2013-11-07 NOTE — Discharge Instructions (Signed)
Read the information below.  Use the prescribed medication as directed.  Please discuss all new medications with your pharmacist.  You may return to the Emergency Department at any time for worsening condition or any new symptoms that concern you.  If there is any possibility that you might be pregnant, please let your health care provider know and discuss this with the pharmacist to ensure medication safety.     Medication Refill, Emergency Department We have refilled your medication today as a courtesy to you. It is best for your medical care, however, to take care of getting refills done through your primary caregiver's office. They have your records and can do a better job of follow-up than we can in the emergency department. On maintenance medications, we often only prescribe enough medications to get you by until you are able to see your regular caregiver. This is a more expensive way to refill medications. In the future, please plan for refills so that you will not have to use the emergency department for this. Thank you for your help. Your help allows [place] to better take care of the daily emergencies that enter our department. Document Released: 02/01/2004 Document Revised: 01/07/2012 Document Reviewed: 10/15/2005 [facility] Patient Information 2014 [place], [place].

## 2013-11-07 NOTE — ED Notes (Signed)
Pt. Stated, i need my medications refill for seizures

## 2013-11-07 NOTE — ED Provider Notes (Signed)
CSN: [id number]     Arrival date & time 11/07/13  1057 History  This chart was scribed for non-physician practitioner, Clayton Bibles, PA-C, working with Veryl Speak, MD by Roe Coombs, ED Scribe. This patient was seen in room TR04C/TR04C and the patient's care was started at 11:50 AM.    Chief Complaint  Patient presents with  . Medication Refill    The history is provided by the patient. No language interpreter was used.    HPI Comments: Isabella Jenkins is a 38 y.o. female with a history of seizures who presents to the Emergency Department for a medication refill on Depakote ER 500 mg. She is supposed to be taking 1500 mg every evening. Last dose was last night. She has not had any seizures since running out of her medication. She denies fever, chills, nausea, vomiting, diarrhea, abdominal pain, and chest pain. She recently moved to the area and has not yet established with a PCP or neurologist. She is a current smoker.   Past Medical History  Diagnosis Date  . Seizures   . Seizure    Past Surgical History  Procedure Laterality Date  . Gastric bypass     No family history on file. History  Substance Use Topics  . Smoking status: Current Some Day Smoker  . Smokeless tobacco: Not on file  . Alcohol Use: No   OB History   Grav Para Term Preterm Abortions TAB SAB Ect Mult Living                 Review of Systems  Constitutional: Negative for fever and chills.  Respiratory: Negative for shortness of breath.   Cardiovascular: Negative for chest pain.  Gastrointestinal: Negative for nausea, vomiting, diarrhea and abdominal distention.  Neurological: Negative for seizures.    Allergies  Ibuprofen and Tramadol  Home Medications   Current Outpatient Rx  Name  Route  Sig  Dispense  Refill  . divalproex (DEPAKOTE ER) 500 MG 24 hr tablet   Oral   Take 3 tablets (1,500 mg total) by mouth every evening.   21 tablet   0    BP 165/102  Pulse 103  Temp(Src) 97.5 F (36.4 C)  (Oral)  Resp 16  Ht 5\' 4"  (1.626 m)  Wt 158 lb 6 oz (71.838 kg)  BMI 27.17 kg/m2  SpO2 100%  LMP 11/04/2013 Physical Exam  Nursing note and vitals reviewed. Constitutional: She appears well-developed and well-nourished. No distress.  HENT:  Head: Normocephalic and atraumatic.  Neck: Neck supple.  Cardiovascular: Normal rate and regular rhythm.   Pulmonary/Chest: Effort normal and breath sounds normal. No respiratory distress. She has no wheezes. She has no rales.  Abdominal: Soft. She exhibits no distension. There is no tenderness. There is no rebound and no guarding.  Neurological: She is alert.  Skin: She is not diaphoretic.    ED Course  Procedures (including critical care time) DIAGNOSTIC STUDIES: Oxygen Saturation is 100% on room air, normal by my interpretation.    COORDINATION OF CARE: 11:56 AM- Patient informed of current plan for treatment and evaluation and agrees with plan at this time.     MDM   1. Medication refill    Patient reports she is new to the area and does not yet have a primary care provider to prescribe her seizure medications. Patient is on Depakote and has run out today. She took her last dose last night. She reports no complaints and has had no seizures. Patient discharged home  with 2 weeks of her medication and resources for primary care followup. Pt given return precautions.  Pt verbalizes understanding and agrees with plan.      I personally performed the services described in this documentation, which was scribed in my presence. The recorded information has been reviewed and is accurate.    Clayton Bibles, PA-C 11/07/13 1339

## 2013-11-08 NOTE — ED Provider Notes (Signed)
Medical screening examination/treatment/procedure(s) were performed by non-physician practitioner and as supervising physician I was immediately available for consultation/collaboration.     Veryl Speak, MD 11/08/13 [phone]

## 2013-11-21 ENCOUNTER — Encounter (HOSPITAL_COMMUNITY): Payer: Self-pay | Admitting: Emergency Medicine

## 2013-11-21 ENCOUNTER — Emergency Department (HOSPITAL_COMMUNITY)
Admission: EM | Admit: 2013-11-21 | Discharge: 2013-11-21 | Disposition: A | Discharge status: Home / Self Care | Attending: Emergency Medicine | Admitting: Emergency Medicine

## 2013-11-21 DIAGNOSIS — F17200 Nicotine dependence, unspecified, uncomplicated: Secondary | ICD-10-CM | POA: Insufficient documentation

## 2013-11-21 DIAGNOSIS — Z79899 Other long term (current) drug therapy: Secondary | ICD-10-CM | POA: Insufficient documentation

## 2013-11-21 DIAGNOSIS — Z760 Encounter for issue of repeat prescription: Secondary | ICD-10-CM | POA: Insufficient documentation

## 2013-11-21 DIAGNOSIS — G40909 Epilepsy, unspecified, not intractable, without status epilepticus: Secondary | ICD-10-CM | POA: Insufficient documentation

## 2013-11-21 MED ORDER — DIVALPROEX SODIUM ER 500 MG PO TB24: 1500.0000 mg | ORAL_TABLET | Freq: Every evening | ORAL | Status: DC

## 2013-11-21 NOTE — Discharge Instructions (Signed)
Medication Refill, Emergency Department  We have refilled your medication today as a courtesy to you. It is best for your medical care, however, to take care of getting refills done through your primary caregiver's office. They have your records and can do a better job of follow-up than we can in the emergency department.  On maintenance medications, we often only prescribe enough medications to get you by until you are able to see your regular caregiver. This is a more expensive way to refill medications.  In the future, please plan for refills so that you will not have to use the emergency department for this.  Thank you for your help. Your help allows us to better take care of the daily emergencies that enter our department.  Document Released: 02/01/2004 Document Revised: 01/07/2012 Document Reviewed: 10/15/2005  [employer]® Patient Information ©2014 [employer].

## 2013-11-21 NOTE — ED Notes (Signed)
The pt wants a med refill for depakote  ER.  She took her last pill last pm

## 2013-11-21 NOTE — ED Notes (Signed)
Pt needs a medication refill. No reported pain or discomfort.  Pt has hx of seizures and needs refill on meds.

## 2013-11-21 NOTE — ED Notes (Signed)
Patient denies pain and is resting seated comfortably.

## 2013-11-21 NOTE — ED Provider Notes (Signed)
CSN: [id number]     Arrival date & time 11/21/13  1522 History   First MD Initiated Contact with Patient 11/21/13 1533     Chief Complaint  Patient presents with  . med refill    (Consider location/radiation/quality/duration/timing/severity/associated sxs/prior Treatment) HPI Comments: Patient presents to the emergency department with chief complaint of medication refill. She states that she has known seizure disorder, and takes Depakote 500 mg 3 times a day. She states that her last dose was last night. She denies any seizure today. She denies any other symptoms. She states that she would like her refill the medication. She is in the process of establishing primary care, but is waiting for her insurance to go through. She states that she has approximately 45 more days until this insurance issue is resolved. Patient denies headache, blurred vision, new hearing loss, sore throat, chest pain, shortness of breath, nausea, vomiting, diarrhea, constipation, dysuria, peripheral edema, back pain, numbness or tingling of the extremities.   The history is provided by the patient. No language interpreter was used.    Past Medical History  Diagnosis Date  . Seizures   . Seizure    Past Surgical History  Procedure Laterality Date  . Gastric bypass     No family history on file. History  Substance Use Topics  . Smoking status: Current Some Day Smoker  . Smokeless tobacco: Not on file  . Alcohol Use: No   OB History   Grav Para Term Preterm Abortions TAB SAB Ect Mult Living                 Review of Systems  All other systems reviewed and are negative.    Allergies  Ibuprofen and Tramadol  Home Medications   Current Outpatient Rx  Name  Route  Sig  Dispense  Refill  . divalproex (DEPAKOTE ER) 500 MG 24 hr tablet   Oral   Take 3 tablets (1,500 mg total) by mouth every evening.   42 tablet   0    BP 126/90  Pulse 77  Temp(Src) 97.6 F (36.4 C) (Oral)  Resp 18  SpO2 100%   LMP 11/04/2013 Physical Exam  Nursing note and vitals reviewed. Constitutional: She is oriented to person, place, and time. She appears well-developed and well-nourished.  HENT:  Head: Normocephalic and atraumatic.  Eyes: Conjunctivae and EOM are normal.  Neck: Normal range of motion.  Cardiovascular: Normal rate.   Pulmonary/Chest: Effort normal.  Abdominal: She exhibits no distension.  Musculoskeletal: Normal range of motion.  Neurological: She is alert and oriented to person, place, and time.  Skin: Skin is dry.  Psychiatric: She has a normal mood and affect. Her behavior is normal. Judgment and thought content normal.    ED Course  Procedures (including critical care time) Labs Review Labs Reviewed - No data to display Imaging Review No results found.  EKG Interpretation   None       MDM   1. Medication refill     Medication refill for patient with known seizure disorder. No other health problems or concerns at this time. She believes that she will be able to establish care within the next 45-60 days. She is waiting on her insurance. I will give her one month worth of medication with one refill.    Montine Circle, PA-C 11/21/13 1549

## 2013-11-21 NOTE — ED Provider Notes (Signed)
Medical screening examination/treatment/procedure(s) were performed by non-physician practitioner and as supervising physician I was immediately available for consultation/collaboration.  EKG Interpretation   None         Osvaldo Shipper, MD 11/21/13 1739

## 2014-01-21 ENCOUNTER — Encounter (HOSPITAL_COMMUNITY): Payer: Self-pay | Admitting: Emergency Medicine

## 2014-01-21 ENCOUNTER — Emergency Department (HOSPITAL_COMMUNITY)
Admission: EM | Admit: 2014-01-21 | Discharge: 2014-01-21 | Disposition: A | Discharge status: Home / Self Care | Attending: Emergency Medicine | Admitting: Emergency Medicine

## 2014-01-21 DIAGNOSIS — Z79899 Other long term (current) drug therapy: Secondary | ICD-10-CM | POA: Insufficient documentation

## 2014-01-21 DIAGNOSIS — F17200 Nicotine dependence, unspecified, uncomplicated: Secondary | ICD-10-CM | POA: Insufficient documentation

## 2014-01-21 DIAGNOSIS — K089 Disorder of teeth and supporting structures, unspecified: Secondary | ICD-10-CM | POA: Insufficient documentation

## 2014-01-21 DIAGNOSIS — G40909 Epilepsy, unspecified, not intractable, without status epilepticus: Secondary | ICD-10-CM | POA: Insufficient documentation

## 2014-01-21 DIAGNOSIS — H9209 Otalgia, unspecified ear: Secondary | ICD-10-CM | POA: Insufficient documentation

## 2014-01-21 DIAGNOSIS — R6884 Jaw pain: Secondary | ICD-10-CM | POA: Insufficient documentation

## 2014-01-21 MED ORDER — METHOCARBAMOL 500 MG PO TABS: 500.0000 mg | ORAL_TABLET | Freq: Two times a day (BID) | ORAL | Status: DC | PRN

## 2014-01-21 MED ORDER — DIVALPROEX SODIUM 500 MG PO DR TAB: 1500.0000 mg | DELAYED_RELEASE_TABLET | Freq: Every evening | ORAL | Status: DC

## 2014-01-21 MED ORDER — HYDROCODONE-ACETAMINOPHEN 5-325 MG PO TABS: 1.0000 | ORAL_TABLET | ORAL | Status: DC | PRN

## 2014-01-21 NOTE — ED Provider Notes (Signed)
CSN: [id number]     Arrival date & time 01/21/14  1425 History  This chart was scribed for non-physician practitioner, Quincy Carnes, PA-C, working with Arbie Cookey, by Celesta Gentile, ED Scribe. This patient was seen in room TR04C/TR04C and the patient's care was started at 4:20 PM.  Chief Complaint  Patient presents with  . Jaw Pain   The history is provided by the patient. No language interpreter was used.   HPI Comments: Isabella Jenkins is a 38 y.o. female who presents to the Emergency Department complaining of intermittent gradually worsening jaw pain that started 2 years ago after she had a procedure completed in [place].  She reports the procedure was similar to a TMJ procedure, but the pain has gradually worsened over time.  Pt states the pain radiates into her right ear and down her neck.  She describes the pain as a shooting sensation.  She states her jaw will lock occasionally, like TMJ.  When this occurs, she states she massages the area to loosen the jaw.  She also reports her teeth throb.  She denies having a dentist or oral surgeon in the area.  Denies numbness or paresthesias of face.  No recent oral or facial trauma.  Pt has tried Flexeril, but she states it makes her legs spasm and can't sleep.    Pt also states she needs a refill of her seizure medication.  She reports she is going through a divorce and can't afford to visit a specialist for her medication until her insurance is renewed.   Past Medical History  Diagnosis Date  . Seizures   . Seizure    Past Surgical History  Procedure Laterality Date  . Gastric bypass     History reviewed. No pertinent family history. History  Substance Use Topics  . Smoking status: Current Some Day Smoker  . Smokeless tobacco: Not on file  . Alcohol Use: No   OB History   Grav Para Term Preterm Abortions TAB SAB Ect Mult Living                 Review of Systems  Constitutional: Negative for fever and chills.  HENT:  Positive for ear pain. Negative for facial swelling, sore throat and trouble swallowing.        Right sided jaw pain  Gastrointestinal: Negative for nausea and vomiting.  Skin: Negative for color change and rash.  All other systems reviewed and are negative.    Allergies  Ibuprofen and Tramadol  Home Medications   Current Outpatient Rx  Name  Route  Sig  Dispense  Refill  . acetaminophen (TYLENOL) 500 MG tablet   Oral   Take 1,000 mg by mouth every 6 (six) hours as needed for mild pain.         . divalproex (DEPAKOTE ER) 500 MG 24 hr tablet   Oral   Take 3 tablets (1,500 mg total) by mouth every evening.   90 tablet   1    Triage Vitals: BP 125/91  Pulse 105  Temp(Src) 98.4 F (36.9 C) (Oral)  Resp 20  Ht 5\' 4"  (1.626 m)  Wt 159 lb (72.122 kg)  BMI 27.28 kg/m2  SpO2 99%  LMP 01/06/2014  Physical Exam  Nursing note and vitals reviewed. Constitutional: She is oriented to person, place, and time. She appears well-developed and well-nourished. No distress.  HENT:  Head: Normocephalic and atraumatic.  Mouth/Throat: Uvula is midline, oropharynx is clear and moist and mucous  membranes are normal. No oral lesions. No trismus in the jaw. Normal dentition. No dental abscesses or dental caries. No oropharyngeal exudate, posterior oropharyngeal edema, posterior oropharyngeal erythema or tonsillar abscesses.  Teeth largely in good dentition, gingiva normal in appearance without swelling or abscess formation, handling secretions appropriately Slight click of right TM joint.  No dislocation.  No trismus.  No bony abnormalities.  Eyes: Conjunctivae and EOM are normal. Right eye exhibits no discharge. Left eye exhibits no discharge.  Neck: Neck supple. No tracheal deviation present.  Cardiovascular: Normal rate.   Pulmonary/Chest: Effort normal. No respiratory distress.  Musculoskeletal: Normal range of motion.  Neurological: She is alert and oriented to person, place, and time.   Skin: Skin is warm and dry. No rash noted.  Psychiatric: She has a normal mood and affect. Her behavior is normal.    ED Course  Procedures (including critical care time) DIAGNOSTIC STUDIES: Oxygen Saturation is 99% on RA, normal by my interpretation.    COORDINATION OF CARE: 4:29 PM-Will discharge with Vicodin, Depakote, and Robaxin.  Patient informed of current plan of treatment and evaluation and agrees with plan.    MDM   Final diagnoses:  Jaw pain  Seizure disorder   Patient has slight click of her right TM joint without dislocation. No trismus. No signs of dental abscess.  She'll be started on Vicodin and Robaxin for pain. She will followup with dentist or oral surgeon, referrals and resources provided.  Refill of depakote.  I personally performed the services described in this documentation, which was scribed in my presence. The recorded information has been reviewed and is accurate.  Larene Pickett, PA-C 01/21/14 1819

## 2014-01-21 NOTE — ED Notes (Signed)
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 Also needs refill of seizure meds, took last dose last night.

## 2014-01-21 NOTE — ED Notes (Signed)
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 States pain worsening and takes OTC meds which do not help. Tried flexeril but causes leg spasming and "crawling feeling over body".

## 2014-01-21 NOTE — Discharge Instructions (Signed)
Take prescribed medication as directed. Follow up with dentist or oral surgeon in the area-- referral provided and resource guide below to help with this. Return to the ED for new concerns.   Emergency Department Resource Guide 1) Find a Doctor and Pay Out of Pocket Although you won't have to find out who is covered by your insurance plan, it is a good idea to ask around and get recommendations. You will then need to call the office and see if the doctor you have chosen will accept you as a new patient and what types of options they offer for patients who are self-pay. Some doctors offer discounts or will set up payment plans for their patients who do not have insurance, but you will need to ask so you aren't surprised when you get to your appointment.  2) Contact Your Local Health Department Not all health departments have doctors that can see patients for sick visits, but many do, so it is worth a call to see if yours does. If you don't know where your local health department is, you can check in your phone book. The CDC also has a tool to help you locate your state's health department, and many state websites also have listings of all of their local health departments.  3) Find a [facility] If your illness is not likely to be very severe or complicated, you may want to try a walk in clinic. These are popping up all over the country in pharmacies, drugstores, and shopping centers. They're usually staffed by nurse practitioners or physician assistants that have been trained to treat common illnesses and complaints. They're usually fairly quick and inexpensive. However, if you have serious medical issues or chronic medical problems, these are probably not your best option.  No Primary Care Doctor: - Call Health Connect at  [phone] - they can help you locate a primary care doctor that  accepts your insurance, provides certain services, etc. - Physician Referral Service- [phone]  Chronic  Pain Problems: Organization         Address  Phone   Notes  [facility]  [phone] Patients need to be referred by their primary care doctor.   Medication Assistance: Organization         Address  Phone   Notes  [facility] Medication [address] [place], [place] [address] [phone] --Must be a resident of [facility] -- Must have NO insurance coverage whatsoever (no Medicaid/ Medicare, etc.) -- The pt. MUST have a primary care doctor that directs their care regularly and follows them in the community   MedAssist  [phone]   [employer]  [phone]    Agencies that provide inexpensive medical care: Organization         Address  Phone   Notes  [place]  [phone]   [facility]    [phone]   [facility] [place], [place] [address] [phone]   [place] [address] [place] [phone]   Planned Parenthood    [phone]   [facility]    [phone]   [place] and [place] [address], [place] Phone:  [phone], Fax:  [phone] Hours of Operation:  9 am - 6 pm, M-F.  Also accepts Medicaid/Medicare and self-pay.  [facility]  [place] [employer], [address], [place] Phone: [phone], Fax: [phone]. Hours of Operation:  8:30 am - 5:30 pm, M-F.  Also accepts Medicaid and self-pay.  [facility] [place] [address], [place] Phone: [phone]   [place], [place], [place] [phone], Ext. [phone] Mondays & Thursdays: 7-9 AM.  First 15 patients are seen on a first come, first serve basis.    [place] Providers:  Organization         Address  Phone   Notes  [facility] [address], [place] [phone] Also  accepts self-pay patients.  [facility] [address], [place]  [phone]   [place], [address], [place] [phone]   [facility] [address], [place] [phone]   Lucianne Lei [address], [place]   [phone] Only accepts [place] Access [place] patients after they have their name applied to their card.   Self-Pay (no insurance) in [facility]:  Organization         Address  Phone   Notes  Sickle Cell Patients, [facility] Internal Medicine [place] [phone]   [facility] Urgent Care [place] [phone]   [facility] [place]  [place], [place], [place] [phone]   [facility]/Dr. Osei-Bonsu  [address] [place] or [address], [place] [phone] Phone number for both [place] and [place] locations is the same.  Urgent Medical and [facility] [address], [place] [phone]   [facility] [address] [place] or [address] [phone]   [facility] [address] [place] [phone], phone; [phone], fax Sees patients 1st and 3rd Saturday of every month.  Must not qualify for public or private insurance (i.e. Medicaid, Medicare, [place] Health Choice, Veterans' Benefits)  Household income should be no more than 200% of the poverty level The clinic cannot treat you if you are pregnant or think you are pregnant  Sexually transmitted diseases are not treated at the clinic.    Dental Care: Organization         Address  Phone  Notes  [facility] Department of [facility] [place] [phone] Accepts children up to age 37 who are enrolled in [place] or [place]; pregnant  women with a Medicaid card; and children who have applied for Medicaid or [place] Health Choice, but were declined, whose parents can pay a reduced fee at time of service.  [facility] Department of [facility]  [address], [place] [phone] Accepts children up to age 58 who are enrolled in [place] or [place]; pregnant women with a Medicaid card; and children who have applied for Medicaid or [place] Health Choice, but were declined, whose parents can pay a reduced fee at time of service.  [place] Adult Dental Access PROGRAM  [place] [phone] Patients are seen by appointment only. Walk-ins are not accepted. [place] will see patients 22 years of age and older. Monday - Tuesday (8am-5pm) Most Wednesdays (8:30-5pm) $30 per visit, cash only  [place] Adult  Dental Access PROGRAM  [address], [facility] [phone] Patients are seen by appointment only. Walk-ins are not accepted. [place] will see patients 60 years of age and older. One Wednesday Evening (Monthly: Volunteer Based).  $30 per visit, cash only  [place]  [phone] for adults; Children under age 4, call Graduate Pediatric Dentistry at [phone]. Children aged 49-14, please call [phone] to request a pediatric application.  Dental services are provided in all areas of dental care including fillings, crowns and bridges, complete and partial dentures, implants, gum treatment, root canals, and extractions. Preventive care is also provided. Treatment is provided to both adults and children. Patients are selected via a lottery and there is often a waiting list.   [facility] [address] [place]  [phone] [email or web address]   [facility] [address] [place], [place] [phone], Ext. [phone] Second and Fourth Thursday of each month, opens at 6:30 AM; Clinic ends at 9 AM.  Patients are  seen on a first-come first-served basis, and a limited number are seen during each clinic.   [facility]  [address] Hillard Danker [place], [place] [phone]   Eligibility Requirements You must have lived in [place], [place], or [place] counties for at least the last three months.   You cannot be eligible for state or federal sponsored [employer], including [employer], [place], or [employer].   You generally cannot be eligible for healthcare insurance through your employer.    How to apply: Eligibility screenings are held every Tuesday and Wednesday afternoon from 1:00 pm until 4:00 pm. You do not need an appointment for the interview!  [facility] [address] [place], [place]   [place]  [place] Department  [place]  [phone]    Behavioral Health Resources in the Community: Intensive Outpatient Programs Organization         Address  Phone  Notes  [place]. [address], [place], [place] [phone]   [facility] Outpatient [address], [place], [place]   ADS: Alcohol & Drug Svcs [address], [place], [place]   [place] [address],  [place], [place] or [phone]   Substance Abuse Resources Organization         Address  Phone  Notes  Alcohol and Drug Services  [phone]   [place]  [phone]   The [place]   Chinita Pester  [phone]   Residential & Outpatient Substance Abuse Program  [phone]   Psychological Services Organization         Address  Phone  Notes  [facility] [place]  [place]  [phone]   [place] [address] [place] [phone] or [phone]    Mobile Crisis  Teams Organization         Address  Phone  Notes  [facility], Mobile Crisis Care Unit  [phone]   Assertive Psychotherapeutic Services  [address] [place], [place]   [place] Levels [address] [place] [phone]    Self-Help/Support Groups Organization         Address  Phone             Notes  Mental  Health Assoc. of [place] - variety of support groups  [place] Call for more information  Narcotics Anonymous (NA), [facility]aring [facility] [address], [employer] [place]  2 meetings at this location   Special educational needs teacher         Address  Phone  Notes  [facility] Residential Treatment [place],    [place]  [phone]   [facility]  [address], [place] [address], [place], [place]   [place], [place] [phone] Admissions: 8am-3pm M-F  Incentives Substance [place] [address],    [place], [place] [phone]   [facility] [address] [place], [place], [place]   [facility] [address]  [place], [place]   [facility]ight Programs - Intensive Outpatient [address]een [address], [place], [place]   [facility] [place].,  [place], [place] [phone] or [phone]   Residential Treatment Services (RTS) [address] [place], [place] Accepts Medicaid  [facility] [place] [address],  [place] [phone] Substance Abuse/Addiction Treatment   [facility] Organization         Address  Phone  Notes  [facility]  [phone]   Domenic Schwab, PhD [address] Arlis [place], [place]   [phone] or [phone]   [place], [place] [phone]   [facility] [address], [place], [place] [phone]  Insurance/Medicaid/sponsorship through [facility] and Families [address]                                    [place], [place] [phone] [place] [address], [place] [phone]    Dr. Adele Schilder  [phone]   [facility] Dept. 1) [address] [place] 2) [place] 3)  [place] [address], [place] [phone]  [phone]   [place] [phone] or [phone] (After Hours)

## 2014-01-26 NOTE — ED Provider Notes (Signed)
Medical screening examination/treatment/procedure(s) were performed by non-physician practitioner and as supervising physician I was immediately available for consultation/collaboration.   EKG Interpretation None        Arbie Cookey, MD 01/26/14 [phone]

## 2014-02-20 ENCOUNTER — Encounter (HOSPITAL_COMMUNITY): Payer: Self-pay | Admitting: Emergency Medicine

## 2014-02-20 ENCOUNTER — Emergency Department (HOSPITAL_COMMUNITY)
Admission: EM | Admit: 2014-02-20 | Discharge: 2014-02-20 | Disposition: A | Discharge status: Home / Self Care | Attending: Emergency Medicine | Admitting: Emergency Medicine

## 2014-02-20 DIAGNOSIS — Z760 Encounter for issue of repeat prescription: Secondary | ICD-10-CM | POA: Insufficient documentation

## 2014-02-20 DIAGNOSIS — R6884 Jaw pain: Secondary | ICD-10-CM | POA: Insufficient documentation

## 2014-02-20 DIAGNOSIS — G40909 Epilepsy, unspecified, not intractable, without status epilepticus: Secondary | ICD-10-CM | POA: Insufficient documentation

## 2014-02-20 DIAGNOSIS — F17200 Nicotine dependence, unspecified, uncomplicated: Secondary | ICD-10-CM | POA: Insufficient documentation

## 2014-02-20 DIAGNOSIS — Z9889 Other specified postprocedural states: Secondary | ICD-10-CM | POA: Insufficient documentation

## 2014-02-20 MED ORDER — METHOCARBAMOL 500 MG PO TABS: 500.0000 mg | ORAL_TABLET | Freq: Two times a day (BID) | ORAL | Status: DC | PRN

## 2014-02-20 MED ORDER — DIVALPROEX SODIUM ER 500 MG PO TB24: 1500.0000 mg | ORAL_TABLET | Freq: Every evening | ORAL | Status: DC

## 2014-02-20 MED ORDER — OXYCODONE-ACETAMINOPHEN 5-325 MG PO TABS: 1.0000 | ORAL_TABLET | Freq: Four times a day (QID) | ORAL | Status: DC | PRN

## 2014-02-20 NOTE — Progress Notes (Signed)
ED CM consulted to meet with patient regarding establishing care. Pt presented to [facility] ED c/o running out of seizure meds. Pt has had 6 ED visits in the last 6 months. Met patient at bedside confirmed information. Pt reports, that she does not have insurance, was covered under her ex-husband's TRICARE but now expired. Pt is currently unemployed Discussed Affordable Primary Care.  Pt mentioned that a friend told her about the [facility] and the [facility] card. Discussed the importance and benefits of Primary Care f/u. Pt verbalizes understanding and is in agreement. Provided patient with [facility] brochure, and offered to assist with an appointment to establish care. Pt appreciative and   agreeable to discharge plan. Looked at [facility] schedule first available appointment is 5/4 @ 2p with Chari Manning NP. Pt made aware of time and date. Encouraged patient if she can't make the appointment to call and cancel at least 24 hours in advance. Pt verbalizes understanding  teach back was done. Pt states, she does not have any barriers to obtaining her meds. No further ED CM needs identified.

## 2014-02-20 NOTE — ED Provider Notes (Signed)
CSN: [id number]     Arrival date & time 02/20/14  1329 History  This chart was scribed for non-physician practitioner, Margarita Mail, PA-C, working with Tanna Furry, MD by Roe Coombs, ED Scribe. This patient was seen in room TR07C/TR07C and the patient's care was started at 2:56 PM.  Chief Complaint  Patient presents with  . Medication Refill    The history is provided by the patient. No language interpreter was used.    HPI Comments: Isabella Jenkins is a 38 y.o. female with a history of seizures who presents to the Emergency Department requesting a medication refill on Depakote ER 500 mg. She takes 3 tablets nightly. She says that she does not have insurance so she hasn't been able to establish with a neurologist for regular care. She denies any recent seizure activity. She also has recurrent right jaw pain that radiates up to her face and has taken Robaxin and percocet for this in the past. She has a history of jaw surgery while living in [place], but this was not therapeutic. She denies fever, chills, nausea, vomiting, abdominal pain, chest pain, shortness of breath, cough, headache or rash. Patient is a smoker.  Past Medical History  Diagnosis Date  . Seizures   . Seizure    Past Surgical History  Procedure Laterality Date  . Gastric bypass     No family history on file. History  Substance Use Topics  . Smoking status: Current Some Day Smoker  . Smokeless tobacco: Not on file  . Alcohol Use: No   OB History   Grav Para Term Preterm Abortions TAB SAB Ect Mult Living                 Review of Systems  Constitutional: Negative for fever and chills.  HENT:       Positive for jaw pain.  Respiratory: Negative for cough and shortness of breath.   Cardiovascular: Negative for chest pain.  Gastrointestinal: Negative for nausea, vomiting and abdominal pain.  Skin: Negative for rash.  Neurological: Negative for seizures and headaches.      Allergies  Ibuprofen and  Tramadol  Home Medications   Prior to Admission medications   Medication Sig Start Date End Date Taking? Authorizing Provider  acetaminophen (TYLENOL) 500 MG tablet Take 1,000 mg by mouth every 6 (six) hours as needed for mild pain.    Historical Provider, MD  divalproex (DEPAKOTE ER) 500 MG 24 hr tablet Take 3 tablets (1,500 mg total) by mouth every evening. 11/21/13   Montine Circle, PA-C  divalproex (DEPAKOTE) 500 MG DR tablet Take 3 tablets (1,500 mg total) by mouth every evening. 01/21/14   Larene Pickett, PA-C  HYDROcodone-acetaminophen (NORCO/VICODIN) 5-325 MG per tablet Take 1 tablet by mouth every 4 (four) hours as needed. 01/21/14   Larene Pickett, PA-C  methocarbamol (ROBAXIN) 500 MG tablet Take 1 tablet (500 mg total) by mouth 2 (two) times daily as needed. 01/21/14   Larene Pickett, PA-C   Triage Vitals: BP 134/83  Pulse 84  Temp(Src) 98.8 F (37.1 C) (Oral)  Resp 16  Wt 159 lb 11.2 oz (72.439 kg)  SpO2 100%  LMP 02/03/2014 Physical Exam  Constitutional: She is oriented to person, place, and time. She appears well-developed and well-nourished. No distress.  HENT:  Head: Normocephalic and atraumatic.  Eyes: Conjunctivae and EOM are normal.  Neck: Neck supple.  Cardiovascular: Normal rate.   Pulmonary/Chest: Effort normal.  Musculoskeletal: Normal range of motion.  Neurological:  She is alert and oriented to person, place, and time.  Skin: Skin is warm and dry.  Psychiatric: She has a normal mood and affect. Her behavior is normal.    ED Course  Procedures (including critical care time) DIAGNOSTIC STUDIES: Oxygen Saturation is 100% on room air, normal by my interpretation.    COORDINATION OF CARE: 3:02 PM- Will order consult to care management for advisement on establishing with PCP. Patient informed of current plan for treatment and evaluation and agrees with plan at this time.    Labs Review Labs Reviewed - No data to display  Imaging Review No results  found.   EKG Interpretation None      MDM   Final diagnoses:  Encounter for medication refill    Case manager Mariann Laster has seen the patient. She has an [facility] in 4 [facility] at the [facility] health [facility] wellness center for  Management of her  Seizure disorder. I have given her a small amount of pain medication for her jaw.  The patient appears reasonably screened and/or stabilized for discharge and I doubt any other medical condition or other [facility] requiring further screening, evaluation, or treatment in the ED at this time prior to discharge.    I personally performed the services described in this documentation, which was scribed in my presence. The recorded information has been reviewed and is accurate.     Margarita Mail, PA-C 02/21/14 1520

## 2014-02-20 NOTE — ED Notes (Signed)
Pt. Stasted, I need medication refill, Depokate

## 2014-02-20 NOTE — Discharge Instructions (Signed)
Medication Refill, Emergency Department We have refilled your medication today as a courtesy to you. It is best for your medical care, however, to take care of getting refills done through your primary caregiver's office. They have your records and can do a better job of follow-up than we can in the emergency department. On maintenance medications, we often only prescribe enough medications to get you by until you are able to see your regular caregiver. This is a more expensive way to refill medications. In the future, please plan for refills so that you will not have to use the emergency department for this. Thank you for your help. Your help allows [place] to better take care of the daily emergencies that enter our department. Document Released: 02/01/2004 Document Revised: 01/07/2012 Document Reviewed: 10/15/2005 [facility] Patient Information 2014 [place], [place].  Chronic Pain Discharge Instructions  Emergency care providers appreciate that many patients coming to [place] are in severe pain and we wish to address their pain in the safest, most responsible manner.  It is important to recognize however, that the proper treatment of chronic pain differs from that of the pain of injuries and acute illnesses.  Our goal is to provide quality, safe, personalized care and we thank you for giving [place] the opportunity to serve you. The use of narcotics and related agents for chronic pain syndromes may lead to additional physical and psychological problems.  Nearly as many people die from prescription narcotics each year as die from car crashes.  Additionally, this risk is increased if such prescriptions are obtained from a variety of sources.  Therefore, only your primary care physician or a pain management specialist is able to safely treat such syndromes with narcotic medications long-term.    Documentation revealing such prescriptions have been sought from multiple sources may prohibit [place] from providing a refill or  different narcotic medication.  Your name may be checked first through the [facility].  This database is a record of controlled substance medication prescriptions that the patient has received.  This has been established by [facility] in an effort to eliminate the dangerous, and often life threatening, practice of obtaining multiple prescriptions from different medical providers.   If you have a chronic pain syndrome (i.e. chronic headaches, recurrent back or neck pain, dental pain, abdominal or pelvis pain without a specific diagnosis, or neuropathic pain such as fibromyalgia) or recurrent visits for the same condition without an acute diagnosis, you may be treated with non-narcotics and other non-addictive medicines.  Allergic reactions or negative side effects that may be reported by a patient to such medications will not typically lead to the use of a narcotic analgesic or other controlled substance as an alternative.   Patients managing chronic pain with a personal physician should have provisions in place for breakthrough pain.  If you are in crisis, you should call your physician.  If your physician directs you to the emergency department, please have the doctor call and speak to our attending physician concerning your care.   When patients come to the Emergency Department (ED) with acute medical conditions in which the Emergency Department physician feels appropriate to prescribe narcotic or sedating pain medication, the physician will prescribe these in very limited quantities.  The amount of these medications will last only until you can see your primary care physician in his/her office.  Any patient who returns to the ED seeking refills should expect only non-narcotic pain medications.   In the event  of an acute medical condition exists and the emergency physician feels it is necessary that the patient be given a narcotic or sedating medication -  a  responsible adult driver should be present in the room prior to the medication being given by the nurse.   Prescriptions for narcotic or sedating medications that have been lost, stolen or expired will not be refilled in the Emergency Department.    Patients who have chronic pain may receive non-narcotic prescriptions until seen by their primary care physician.  It is every patients personal responsibility to maintain active prescriptions with his or her primary care physician or specialist.

## 2014-03-01 ENCOUNTER — Ambulatory Visit: Admitting: Internal Medicine

## 2014-03-04 ENCOUNTER — Ambulatory Visit: Attending: Internal Medicine | Admitting: Internal Medicine

## 2014-03-04 VITALS — BP 129/84 | HR 89 | Temp 98.4°F | Resp 16 | Ht 64.0 in | Wt 163.0 lb

## 2014-03-04 DIAGNOSIS — N949 Unspecified condition associated with female genital organs and menstrual cycle: Secondary | ICD-10-CM | POA: Insufficient documentation

## 2014-03-04 DIAGNOSIS — Z79899 Other long term (current) drug therapy: Secondary | ICD-10-CM | POA: Insufficient documentation

## 2014-03-04 DIAGNOSIS — G40909 Epilepsy, unspecified, not intractable, without status epilepticus: Secondary | ICD-10-CM | POA: Insufficient documentation

## 2014-03-04 LAB — POCT URINE PREGNANCY: Preg Test, Ur: NEGATIVE

## 2014-03-04 LAB — POCT URINALYSIS DIPSTICK
BILIRUBIN UA: NEGATIVE
GLUCOSE UA: NEGATIVE
Ketones, UA: 15
NITRITE UA: NEGATIVE
Protein, UA: 30
Spec Grav, UA: >=1.03
Urobilinogen, UA: 0.2
pH, UA: 6

## 2014-03-04 LAB — POCT GLYCOSYLATED HEMOGLOBIN (HGB A1C): Hemoglobin A1C: 4.9

## 2014-03-04 NOTE — Addendum Note (Signed)
Addended by: Candie Chroman D on: 03/04/2014 04:37 PM   Modules accepted: Orders

## 2014-03-04 NOTE — Progress Notes (Signed)
Patient ID: Isabella Jenkins, female   DOB: Jan 16, 1976, 38 y.o.   MRN: [id number]  CC: Pelvic pressure  HPI: 38 year old female with past medical history of seizure disorder who presented to clinic for evaluation of ongoing pelvic pressure for the past one week with associated frequent urination. Patient reports no significant burning sensation on urination, no blood in the urine, no fevers or chills. She does not report abnormal vaginal discharge. Last menstrual period was 02/03/2014.  Allergies  Allergen Reactions  . Ibuprofen     Can't take with gastric surgery  . Tramadol Other (See Comments)    Can't take with seizure medication.    Past Medical History  Diagnosis Date  . Seizures   . Seizure    Current Outpatient Prescriptions on File Prior to Visit  Medication Sig Dispense Refill  . acetaminophen (TYLENOL) 500 MG tablet Take 1,000 mg by mouth every 6 (six) hours as needed for mild pain.      . divalproex (DEPAKOTE ER) 500 MG 24 hr tablet Take 3 tablets (1,500 mg total) by mouth every evening.  90 tablet  1  . Multiple Vitamin (MULTIVITAMIN WITH MINERALS) TABS tablet Take 1 tablet by mouth daily.      . methocarbamol (ROBAXIN) 500 MG tablet Take 1 tablet (500 mg total) by mouth 2 (two) times daily as needed for muscle spasms.  20 tablet  0  . oxyCODONE-acetaminophen (PERCOCET/ROXICET) 5-325 MG per tablet Take 1-2 tablets by mouth every 6 (six) hours as needed for severe pain.  6 tablet  0   No current facility-administered medications on file prior to visit.   History reviewed. No pertinent family history. History   Social History  . Marital Status: Married    Spouse Name: N/A    Number of Children: N/A  . Years of Education: N/A   Occupational History  . Not on file.   Social History Main Topics  . Smoking status: Current Some Day Smoker  . Smokeless tobacco: Not on file  . Alcohol Use: No  . Drug Use: No  . Sexual Activity: Yes    Birth Control/ Protection: None    Other Topics Concern  . Not on file   Social History Narrative  . No narrative on file    Review of Systems  Constitutional: Negative for fever, chills, diaphoresis, activity change, appetite change and fatigue.  HENT: Negative for ear pain, nosebleeds, congestion, facial swelling, rhinorrhea, neck pain, neck stiffness and ear discharge.   Eyes: Negative for pain, discharge, redness, itching and visual disturbance.  Respiratory: Negative for cough, choking, chest tightness, shortness of breath, wheezing and stridor.   Cardiovascular: Negative for chest pain, palpitations and leg swelling.  Gastrointestinal: Negative for abdominal distention.  Genitourinary: per HPi volume, difficulty urinating and dyspareunia.  Musculoskeletal: Negative for back pain, joint swelling, arthralgias and gait problem.  Neurological: Negative for dizziness, tremors, seizures, syncope, facial asymmetry, speech difficulty, weakness, light-headedness, numbness and headaches.  Hematological: Negative for adenopathy. Does not bruise/bleed easily.  Psychiatric/Behavioral: Negative for hallucinations, behavioral problems, confusion, dysphoric mood, decreased concentration and agitation.    Objective:   Filed Vitals:   03/04/14 1424  BP: 129/84  Pulse: 89  Temp: 98.4 F (36.9 C)  Resp: 16    Physical Exam  Constitutional: Appears well-developed and well-nourished. No distress.  HENT: Normocephalic. External right and left ear normal. Oropharynx is clear and moist.  Eyes: Conjunctivae and EOM are normal. PERRLA, no scleral icterus.  Neck: Normal ROM. Neck supple.  No JVD. No tracheal deviation. No thyromegaly.  CVS: RRR, S1/S2 +, no murmurs, no gallops, no carotid bruit.  Pulmonary: Effort and breath sounds normal, no stridor, rhonchi, wheezes, rales.  Abdominal: Soft. BS +,  no distension, tenderness, rebound or guarding.  Musculoskeletal: Normal range of motion. No edema and no tenderness.   Lymphadenopathy: No lymphadenopathy noted, cervical, inguinal. Neuro: Alert. Normal reflexes, muscle tone coordination. No cranial nerve deficit. Skin: Skin is warm and dry. No rash noted. Not diaphoretic. No erythema. No pallor.  Psychiatric: Normal mood and affect. Behavior, judgment, thought content normal.   Lab Results  Component Value Date   WBC 6.4 10/15/2013   HGB 11.1* 10/15/2013   HCT 34.6* 10/15/2013   MCV 82.4 10/15/2013   PLT 243 10/15/2013   No results found for this basename: CREATININE, BUN, NA, K, CL, CO2    No results found for this basename: HGBA1C   Lipid Panel  No results found for this basename: chol, trig, hdl, cholhdl, vldl, ldlcalc       Assessment and plan:   Patient Active Problem List   Diagnosis Date Noted  . Seizure disorder 03/04/2014    Priority: High - Continue Depakote. No reports of seizures. Last Depakote level done recently was within normal limits     Pelvic pressure  - Obtain pelvic ultrasound, urine pregnancy test, urinalysis      Preventative health  - Referral to GYN provided

## 2014-03-04 NOTE — Progress Notes (Signed)
Pt here to establish care for seizure disorder. Pt is taking Depakote ER 500 mg. C/o right jaw pain radiating to right ear. Pt in process of getting insurance changed. LMP- 02/03/14

## 2014-03-04 NOTE — Patient Instructions (Signed)
Seizure, Adult A seizure is abnormal electrical activity in the brain. Seizures usually last from 30 seconds to 2 minutes. There are various types of seizures. Before a seizure, you may have a warning sensation (aura) that a seizure is about to occur. An aura may include the following symptoms:   Fear or anxiety.  Nausea.  Feeling like the room is spinning (vertigo).  Vision changes, such as seeing flashing lights or spots. Common symptoms during a seizure include:  A change in attention or behavior (altered mental status).  Convulsions with rhythmic jerking movements.  Drooling.  Rapid eye movements.  Grunting.  Loss of bladder and bowel control.  Bitter taste in the mouth.  Tongue biting. After a seizure, you may feel confused and sleepy. You may also have an injury resulting from convulsions during the seizure. HOME CARE INSTRUCTIONS   If you are given medicines, take them exactly as prescribed by your health care provider.  Keep all follow-up appointments as directed by your health care provider.  Do not swim or drive or engage in risky activity during which a seizure could cause further injury to you or others until your health care provider says it is OK.  Get adequate rest.  Teach friends and family what to do if you have a seizure. They should:  Lay you on the ground to prevent a fall.  Put a cushion under your head.  Loosen any tight clothing around your neck.  Turn you on your side. If vomiting occurs, this helps keep your airway clear.  Stay with you until you recover.  Know whether or not you need emergency care. SEEK IMMEDIATE MEDICAL CARE IF:  The seizure lasts longer than 5 minutes.  The seizure is severe or you do not wake up immediately after the seizure.  You have an altered mental status after the seizure.  You are having more frequent or worsening seizures. Someone should drive you to the emergency department or call local emergency  services (911 in [place] MAKE SURE YOU:  Understand these instructions.  Will watch your condition.  Will get help right away if you are not doing well or get worse. Document Released: 10/12/2000 Document Revised: 08/05/2013 Document Reviewed: 05/27/2013 [employer] Patient Information 2014 [employer].  

## 2014-03-09 ENCOUNTER — Ambulatory Visit (HOSPITAL_COMMUNITY)

## 2014-03-09 NOTE — ED Provider Notes (Signed)
Medical screening examination/treatment/procedure(s) were performed by non-physician practitioner and as supervising physician I was immediately available for consultation/collaboration.   EKG Interpretation None        Tanna Furry, MD 03/09/14 1907

## 2014-03-31 ENCOUNTER — Encounter: Admitting: Internal Medicine

## 2014-04-21 ENCOUNTER — Encounter: Payer: Self-pay | Admitting: Emergency Medicine

## 2014-04-21 ENCOUNTER — Other Ambulatory Visit: Payer: Self-pay | Admitting: Internal Medicine

## 2014-04-21 DIAGNOSIS — R569 Unspecified convulsions: Secondary | ICD-10-CM

## 2014-04-21 NOTE — Progress Notes (Unsigned)
Patient ID: Isabella Jenkins, female   DOB: [birth year], 38 y.o.   MRN: [id number] Pt here for medication refill Depakote 500 mg for seizure disorder. Pt is [redacted] weeks pregnant and will run out today. Dr. Doreene Burke spoke with pt and told her we can not prescribed medication during pregnancy. Pt states she has been taking medication while pregnant with two other children and neurologist told her not to stop taking regardless of pregnancy.

## 2014-04-22 ENCOUNTER — Telehealth: Payer: Self-pay | Admitting: Neurology

## 2014-04-22 ENCOUNTER — Ambulatory Visit (INDEPENDENT_AMBULATORY_CARE_PROVIDER_SITE_OTHER): Admitting: Neurology

## 2014-04-22 ENCOUNTER — Encounter (INDEPENDENT_AMBULATORY_CARE_PROVIDER_SITE_OTHER): Payer: Self-pay

## 2014-04-22 ENCOUNTER — Encounter: Payer: Self-pay | Admitting: Neurology

## 2014-04-22 VITALS — BP 115/84 | HR 80 | Ht 64.0 in | Wt 163.0 lb

## 2014-04-22 DIAGNOSIS — F32A Depression, unspecified: Secondary | ICD-10-CM | POA: Insufficient documentation

## 2014-04-22 DIAGNOSIS — R4586 Emotional lability: Secondary | ICD-10-CM | POA: Insufficient documentation

## 2014-04-22 DIAGNOSIS — F419 Anxiety disorder, unspecified: Secondary | ICD-10-CM

## 2014-04-22 DIAGNOSIS — G40909 Epilepsy, unspecified, not intractable, without status epilepticus: Secondary | ICD-10-CM

## 2014-04-22 DIAGNOSIS — F329 Major depressive disorder, single episode, unspecified: Secondary | ICD-10-CM

## 2014-04-22 LAB — VALPROIC ACID LEVEL: VALPROIC ACID LVL: 54.5 ug/mL (ref 50.0–100.0)

## 2014-04-22 MED ORDER — FOLIC ACID 1 MG PO TABS: ORAL_TABLET | ORAL | Status: DC

## 2014-04-22 MED ORDER — DIVALPROEX SODIUM ER 500 MG PO TB24: 1500.0000 mg | ORAL_TABLET | Freq: Every evening | ORAL | Status: DC

## 2014-04-22 MED ORDER — LEVETIRACETAM 1000 MG PO TABS: 1000.0000 mg | ORAL_TABLET | Freq: Two times a day (BID) | ORAL | Status: DC

## 2014-04-22 NOTE — Patient Instructions (Signed)
Stop Depakote now,  Starting keppra 1000mg  bid.  EEG

## 2014-04-22 NOTE — Telephone Encounter (Signed)
I called back.  Spoke with Lanny Hurst.  He said they called and spoke with Medicaid and were able to get the situation resolved.  Nothing further is needed at this time.

## 2014-04-22 NOTE — Progress Notes (Signed)
PATIENT: Isabella Jenkins DOB: 03/15/1976  HISTORICAL (Initial Visit June 25th 2015)  Isabella Jenkins is a 38 right-handed female, [facility] by her primary care [facility], managing her seizure medications, she is currently [redacted] weeks pregnant, this is the first time I have seen her,  She has suffered generalized epilepsy disorder since age 38, over the years, she has been treated with titrating dose of Depakote, she is currently taking Depakote ER 500 mg 3 tablets every night, as long as she is taking her medications, she has no recurrent seizures, the last seizure was in 2012,  She also reported she had two successful pregnancy while taking Depakote, she has 36 years old daughter, 36 years old son, both are healthy,  She moved to [place] in 2014, reviewed the record, she presented to emergency room multiple times over past 1 year for different reasons, including jaw pain, low back pain, refill her antiepileptic medications, laboratory in December 2014 showed Depakote level 55, mild anemia hemoglobin of 11.5  Last the dose of Depakote was last night June 24th 2015, Depakote level was drawn in June 24th, level 23.,   She reported that she has been taking her prenatal vitamin regularly over past few months, has not been on extra folic acid supplements,   She also had past medical history of history gastric bypass surgery in 2010,  REVIEW OF SYSTEMS: Full 14 system review of systems performed and notable only for seizure,   ALLERGIES: Allergies  Allergen Reactions  . Ibuprofen     Can't take with gastric surgery  . Tramadol Other (See Comments)    Can't take with seizure medication.     HOME MEDICATIONS: Current Outpatient Prescriptions on File Prior to Visit  Medication Sig Dispense Refill  . divalproex (DEPAKOTE ER) 500 MG 24 hr tablet Take 3 tablets (1,500 mg total) by mouth every evening.  90 tablet  1  .  Multiple Vitamin (MULTIVITAMIN WITH MINERALS) TABS tablet Take 1 tablet by mouth daily.         PAST MEDICAL HISTORY: Past Medical History  Diagnosis Date  . Seizures   . Depression   . Anxiety   . Mood swings     PAST SURGICAL HISTORY: Past Surgical History  Procedure Laterality Date  . Gastric bypass in 2010, lost 100 Lb    . Wisdom tooth extraction      FAMILY HISTORY: Family History  Problem Relation Age of Onset  . High blood pressure Mother   . High Cholesterol Mother   . Stroke Father   . High blood pressure Father     SOCIAL HISTORY:  History   Social History  . Marital Status: divorced    Spouse Name: N/A    Number of Children: 2  . Years of Education: 51   Occupational History    [employer]   Social History Main Topics  . Smoking status: Former Research scientist (life sciences)  . Smokeless tobacco: Never Used  . Alcohol Use: No  . Drug Use: No  . Sexual Activity: Yes    Birth Control/ Protection: None   Other Topics Concern  . Not on file   Social History Narrative   Patient lives at home with her children . Patient is divorcing. Patient works full time at [employer].    Education high school.   Right handed.   Caffeine one cup of coffee daily.     PHYSICAL EXAM  Filed Vitals:   04/22/14 1020  BP: 115/84  Pulse: 80  Height: 5\' 4"  (1.626 m)  Weight: 163 lb (73.936 kg)    Not recorded    Body mass index is 27.97 kg/(m^2).   Generalized: In no acute distress  Neck: Supple, no carotid bruits   Cardiac: Regular rate rhythm  Pulmonary: Clear to auscultation bilaterally  Musculoskeletal: No deformity  Neurological examination  Mentation: Alert oriented to time, place, history taking, and causual conversation  Cranial nerve II-XII: Pupils were equal round reactive to light. Extraocular movements were full.  Visual field were full on confrontational test. Bilateral fundi were sharp.  Facial sensation and strength were normal. Hearing was intact to  finger rubbing bilaterally. Uvula tongue midline.  Head turning and shoulder shrug and were normal and symmetric.Tongue protrusion into cheek strength was normal.  Motor: Normal tone, bulk and strength.  Sensory: Intact to fine touch, pinprick, preserved vibratory sensation, and proprioception at toes.  Coordination: Normal finger to nose, heel-to-shin bilaterally there was no truncal ataxia  Gait: Rising up from seated position without assistance, normal stance, without trunk ataxia, moderate stride, good arm swing, smooth turning, able to perform tiptoe, and heel walking without difficulty.   Romberg signs: Negative  Deep tendon reflexes: Brachioradialis 2/2, biceps 2/2, triceps 2/2, patellar 2/2, Achilles 2/2, plantar responses were flexor bilaterally.   DIAGNOSTIC DATA (LABS, IMAGING, TESTING) - I reviewed patient records, labs, notes, testing and imaging myself where available.  Lab Results  Component Value Date   WBC 6.4 10/15/2013   HGB 11.1* 10/15/2013   HCT 34.6* 10/15/2013   MCV 82.4 10/15/2013   PLT 243 10/15/2013    Lab Results  Component Value Date   HGBA1C 4.9 03/04/2014   ASSESSMENT AND PLAN  Isabella Jenkins is a 38 y.o. female with history generalized epilepsy disorder, was taking Depakote ER 500 mg 3 tablets every night, currently [redacted] weeks pregnant, reported previously two successful pregnancy while taking Depakote, last seizure was around 2012  I have consulted the patient, Depakote is clearly associated with increased incident of fetal malformation, especially during 1st trimester, it is not a good medication choice during pregnancy,    I will switch her over to Keppra 1000 mg twice a day, with the understanding that she should continue with folic acid supplements, I have written prescription 1 mg 2 tablets every day,  there was still increased chance of congenital malformation while taking keppra 1000mg  bid, compared to general health populations.  She voiced  understanding  EEG. Call office for questions and recurrent seizures. Return to clinic in one month, with Hoyle Sauer, check trough keppra level then,  Will continue close monitoring q 2-3 months during her pregnancy.  Marcial Pacas, M.D. Ph.D.  [facility] [address] [place], [place] [address] [phone]

## 2014-04-22 NOTE — Telephone Encounter (Signed)
Sarah from [facility] calling to state that patient dropped off prescription and that the physician is not covered by Medicaid. Please return call to Judson Roch and advise.

## 2014-04-29 ENCOUNTER — Other Ambulatory Visit: Admitting: Radiology

## 2014-04-29 ENCOUNTER — Telehealth: Payer: Self-pay | Admitting: Neurology

## 2014-04-29 NOTE — Telephone Encounter (Signed)
I have called both number listed to check on her transition from Depakote to [place]. I was not able to reach patient, could not leave message either.  Butch Penny, Please try and contact patient again, make sure that she is doing all right, taking her folic acid daily

## 2014-05-04 ENCOUNTER — Ambulatory Visit (INDEPENDENT_AMBULATORY_CARE_PROVIDER_SITE_OTHER)

## 2014-05-04 DIAGNOSIS — F419 Anxiety disorder, unspecified: Secondary | ICD-10-CM

## 2014-05-04 DIAGNOSIS — F329 Major depressive disorder, single episode, unspecified: Secondary | ICD-10-CM

## 2014-05-04 DIAGNOSIS — F32A Depression, unspecified: Secondary | ICD-10-CM

## 2014-05-04 DIAGNOSIS — R4586 Emotional lability: Secondary | ICD-10-CM

## 2014-05-04 DIAGNOSIS — G40909 Epilepsy, unspecified, not intractable, without status epilepticus: Secondary | ICD-10-CM

## 2014-05-04 NOTE — Procedures (Signed)
   HISTORY: 38 years old female, with history of epilepsy, currently pregnant, was taking Depakote, now taking Keppra.  TECHNIQUE:  16 channel EEG was performed based on standard 10-16 international system. One channel was dedicated to EKG, which has demonstrates normal sinus rhythm of 84 beats per minutes.  Upon awakening, the posterior background activity was well-developed, in alpha range, 10 Hz,  reactive to eye opening and closure.  There was no evidence of epileptiform discharge.  Photic stimulation was performed, which induced a symmetric photic driving.  Hyperventilation was not performed  Patient was drowsy, during drowsiness, there was frequent protrusion of generalized spike slow waves, lasting 2-3 seconds, patient was responses during the episodes, no deeper stages of sleep was achieved  CONCLUSION: This is an abnormal EEG.  There is electrodiagnostic evidence of generalized epileptiform discharges.

## 2014-05-04 NOTE — Telephone Encounter (Signed)
I have called patient, she is doing well with current dose of Keppra 1000 mg twice a day, she is no longer taking Depakote, previous level in June 24th was 54.6, she is currently in her second trimester, complains of mild headaches. Otherwise doing well EEG was abnormal, showed generalized epileptiform discharge,  I have suggested her to keep Keppra 1000 mg twice a day, call office for new issues,

## 2014-05-11 ENCOUNTER — Encounter: Payer: Self-pay | Admitting: Neurology

## 2014-05-18 ENCOUNTER — Encounter (HOSPITAL_COMMUNITY): Payer: Self-pay | Admitting: Emergency Medicine

## 2014-05-18 ENCOUNTER — Emergency Department (HOSPITAL_COMMUNITY)
Admission: EM | Admit: 2014-05-18 | Discharge: 2014-05-19 | Disposition: A | Discharge status: Home / Self Care | Attending: Emergency Medicine | Admitting: Emergency Medicine

## 2014-05-18 DIAGNOSIS — O99352 Diseases of the nervous system complicating pregnancy, second trimester: Secondary | ICD-10-CM

## 2014-05-18 DIAGNOSIS — Z79899 Other long term (current) drug therapy: Secondary | ICD-10-CM | POA: Insufficient documentation

## 2014-05-18 DIAGNOSIS — N390 Urinary tract infection, site not specified: Secondary | ICD-10-CM

## 2014-05-18 DIAGNOSIS — G40909 Epilepsy, unspecified, not intractable, without status epilepticus: Secondary | ICD-10-CM | POA: Insufficient documentation

## 2014-05-18 DIAGNOSIS — Z8659 Personal history of other mental and behavioral disorders: Secondary | ICD-10-CM | POA: Insufficient documentation

## 2014-05-18 DIAGNOSIS — Z87891 Personal history of nicotine dependence: Secondary | ICD-10-CM | POA: Insufficient documentation

## 2014-05-18 DIAGNOSIS — O99350 Diseases of the nervous system complicating pregnancy, unspecified trimester: Principal | ICD-10-CM

## 2014-05-18 DIAGNOSIS — O169 Unspecified maternal hypertension, unspecified trimester: Secondary | ICD-10-CM | POA: Insufficient documentation

## 2014-05-18 HISTORY — DX: Essential (primary) hypertension: I10

## 2014-05-18 MED ORDER — SODIUM CHLORIDE 0.9 % IV BOLUS (SEPSIS)
1000.0000 mL | Freq: Once | INTRAVENOUS | Status: AC
  Administered 2014-05-18: 1000 mL via INTRAVENOUS

## 2014-05-18 MED ORDER — LEVETIRACETAM IN NACL 1500 MG/100ML IV SOLN
1500.0000 mg | Freq: Once | INTRAVENOUS | Status: AC
  Administered 2014-05-19: 1500 mg via INTRAVENOUS
  Filled 2014-05-18: qty 100

## 2014-05-18 NOTE — ED Notes (Signed)
Pt arrives via EMS from home. Pt had a gran mal seizure lasting approx 3 min. Pt has history of seizures. Pt is currently [redacted] wks pregnant with first OB appt tomorrow. Upon EMS arrival pt was post ictal, able to answer some questions. Pt reports a recent change in anti-seizure med 1 month ago from depakote to keppra. Denies HA. Trauma to inner lip with no present bleeding noted. 130/98 96% RA pulse 128 intial pulse upon EMS arrival was 160. CBG 117 20G Left AC.Pt states LMP on 02/03/14. Due date on 11/10/14. Reports lower abdominal cramping upon arrival.

## 2014-05-18 NOTE — ED Notes (Signed)
Pt lethargic during assessment but responds to all questions.

## 2014-05-18 NOTE — ED Notes (Signed)
Patient confirms that she is [redacted] wks pregnant.

## 2014-05-18 NOTE — ED Notes (Signed)
EKG handed to Dr Cheri Guppy

## 2014-05-19 ENCOUNTER — Other Ambulatory Visit: Payer: Self-pay | Admitting: Obstetrics and Gynecology

## 2014-05-19 LAB — URINE MICROSCOPIC-ADD ON

## 2014-05-19 LAB — OB RESULTS CONSOLE HIV ANTIBODY (ROUTINE TESTING): HIV: NONREACTIVE

## 2014-05-19 LAB — COMPREHENSIVE METABOLIC PANEL
ALBUMIN: 2.7 g/dL — AB (ref 3.5–5.2)
ALT: 9 U/L (ref 0–35)
ANION GAP: 10 (ref 5–15)
AST: 11 U/L (ref 0–37)
Alkaline Phosphatase: 53 U/L (ref 39–117)
BILIRUBIN TOTAL: 0.4 mg/dL (ref 0.3–1.2)
BUN: 11 mg/dL (ref 6–23)
CO2: 19 mEq/L (ref 19–32)
Calcium: 7.9 mg/dL — ABNORMAL LOW (ref 8.4–10.5)
Chloride: 108 mEq/L (ref 96–112)
Creatinine, Ser: 0.62 mg/dL (ref 0.50–1.10)
GFR calc Af Amer: >90 mL/min (ref >90)
GFR calc non Af Amer: >90 mL/min (ref >90)
Glucose, Bld: 85 mg/dL (ref 70–99)
Potassium: 3.8 mEq/L (ref 3.7–5.3)
Sodium: 137 mEq/L (ref 137–147)
TOTAL PROTEIN: 5.8 g/dL — AB (ref 6.0–8.3)

## 2014-05-19 LAB — URINALYSIS, ROUTINE W REFLEX MICROSCOPIC
Bilirubin Urine: NEGATIVE
Glucose, UA: NEGATIVE mg/dL
KETONES UR: NEGATIVE mg/dL
NITRITE: POSITIVE — AB
PH: 5.5 (ref 5.0–8.0)
Protein, ur: 30 mg/dL — AB
Specific Gravity, Urine: 1.024 (ref 1.005–1.030)
UROBILINOGEN UA: 0.2 mg/dL (ref 0.0–1.0)

## 2014-05-19 LAB — OB RESULTS CONSOLE RPR: RPR: NONREACTIVE

## 2014-05-19 LAB — CBG MONITORING, ED: Glucose-Capillary: 81 mg/dL (ref 70–99)

## 2014-05-19 LAB — OB RESULTS CONSOLE ABO/RH: RH TYPE: POSITIVE

## 2014-05-19 LAB — OB RESULTS CONSOLE RUBELLA ANTIBODY, IGM: Rubella: IMMUNE

## 2014-05-19 LAB — OB RESULTS CONSOLE GC/CHLAMYDIA
Chlamydia: NEGATIVE
Gonorrhea: NEGATIVE

## 2014-05-19 LAB — OB RESULTS CONSOLE HEPATITIS B SURFACE ANTIGEN: HEP B S AG: NEGATIVE

## 2014-05-19 MED ORDER — LEVETIRACETAM 500 MG PO TABS: 1500.0000 mg | ORAL_TABLET | Freq: Two times a day (BID) | ORAL | Status: DC

## 2014-05-19 MED ORDER — CEPHALEXIN 500 MG PO CAPS: 500.0000 mg | ORAL_CAPSULE | Freq: Three times a day (TID) | ORAL | Status: DC

## 2014-05-19 MED ORDER — CEPHALEXIN 250 MG PO CAPS
1000.0000 mg | ORAL_CAPSULE | Freq: Once | ORAL | Status: AC
  Administered 2014-05-19: 1000 mg via ORAL
  Filled 2014-05-19: qty 4

## 2014-05-19 NOTE — ED Provider Notes (Signed)
CSN: [id number]     Arrival date & time 05/18/14  2304 History   First MD Initiated Contact with Patient 05/18/14 2321     Chief Complaint  Patient presents with  . Seizures     (Consider location/radiation/quality/duration/timing/severity/associated sxs/prior Treatment) HPI This is a 38 yo G3 P2 woman with a long history of seizure disorder who is in her 15wk of pregnancy. She presents via EMS after a seizure which was witnessed by her mother and dtr. Seizure described the third hand report is generalized and tonic-clonic in nature. The seizure lasted approximately 3 minutes and was self-limited. The patient complains of generalized fatigue at this time. She has some mild cramping abdominal pain.  The patient reports compliance with her antiepileptic drugs Keppra 1000 mg twice a day. She was changed from Depakote 1500 mg twice a day to Keppra 1000 mg twice a day by her neurologist, Dr. Krista Blue on June 25. This change was made in consultation with the patient's primary care physician after the patient found out she was pregnant. Keppra is thought to have a lower risk of congenital fetal defects.   Patient's last seizure prior to today was 2 years ago. She was on depakote throughout each of her previous pregnancies and had one seizure during each of those pregnancies.    Past Medical History  Diagnosis Date  . Seizures   . Depression   . Anxiety   . Mood swings   . Hypertension    Past Surgical History  Procedure Laterality Date  . Gastric bypass    . Wisdom tooth extraction     Family History  Problem Relation Age of Onset  . High blood pressure Mother   . High Cholesterol Mother   . Stroke Father   . High blood pressure Father    History  Substance Use Topics  . Smoking status: Former Research scientist (life sciences)  . Smokeless tobacco: Never Used  . Alcohol Use: No   OB History   Grav Para Term Preterm Abortions TAB SAB Ect Mult Living   1              Review of Systems  Ten point review of  symptoms performed and is negative with the exception of symptoms noted above.   Allergies  Ibuprofen and Tramadol  Home Medications   Prior to Admission medications   Medication Sig Start Date End Date Taking? Authorizing Provider  folic acid (FOLVITE) 1 MG tablet Take 2 mg by mouth daily.   Yes Historical Provider, MD  levETIRAcetam (KEPPRA) 1000 MG tablet Take 1 tablet (1,000 mg total) by mouth 2 (two) times daily. 04/22/14  Yes Marcial Pacas, MD  Prenatal Vit-Fe Fumarate-FA (PRENATAL MULTIVITAMIN) TABS tablet Take 1 tablet by mouth daily at 12 noon.   Yes Historical Provider, MD  levETIRAcetam (KEPPRA) 500 MG tablet Take 3 tablets (1,500 mg total) by mouth 2 (two) times daily. 05/19/14   Elyn Peers, MD   BP 116/75  Pulse 115  Resp 18  Ht 5\' 4"  (1.626 m)  Wt 152 lb (68.947 kg)  BMI 26.08 kg/m2  SpO2 98%  LMP 02/03/2014 Physical Exam Gen: well developed and well nourished appearing Head: NCAT Eyes: PERL, EOMI Nose: no epistaixis or rhinorrhea Mouth/throat: mucosa is moist and pink, very subtle mucosal contusion of the mid lower lip, no signs of other intraoral trauma Neck: supple, no stridor, cervical spine is nontender Lungs: CTA B, no wheezing, rhonchi or rales CV: RRR, no murmur, extremities appear well perfused.  Abd: soft, notender, nondistended Back: no midline tenderness, no cva ttp Skin: warm and dry Ext: normal to inspection, no dependent edema, nontender, FROM without pain all major joints bilaterally Neuro: CN ii-xii grossly intact, no focal deficits Psyche; normal affect,  calm and cooperative.  ED Course  Procedures (including critical care time) Labs Review    MDM   Final diagnoses:  Seizure disorder during pregnancy, second trimester   Case discussed with Dr. Doy Mince, Neurohospitalist.  She recommends continuation of Keppra with increasee in dose to 1500mg  bid and f/u with Dr. Krista Blue. Plan discussed with patient who voices understanding and says she has an  appt with Dr. Krista Blue in the next 7 days and also a new patient appt with Obstetrics later on this day. We will give IV load prior to discharge.   0230: Incidental finding of UTI. We will send urine for culture and treat empirically with Keflex.   Elyn Peers, MD 05/19/14 0230

## 2014-05-19 NOTE — Discharge Instructions (Signed)
INCREASE KEPPRA TO 1 1/2 TABLETS (1500MG ) EVERY MORNING AND EVERY NIGHT.

## 2014-05-20 LAB — CYTOLOGY - PAP

## 2014-05-21 LAB — URINE CULTURE

## 2014-05-22 ENCOUNTER — Telehealth (HOSPITAL_BASED_OUTPATIENT_CLINIC_OR_DEPARTMENT_OTHER): Payer: Self-pay | Admitting: Emergency Medicine

## 2014-05-22 NOTE — Telephone Encounter (Signed)
Post ED Visit - Positive Culture Follow-up  Culture report reviewed by antimicrobial stewardship pharmacist: []  Wes Dulaney, Pharm.D., BCPS []  Heide Guile, Pharm.D., BCPS []  Alycia Rossetti, Pharm.D., BCPS []  [place], [place].D., BCPS, AAHIVP [x]  Legrand Como, Pharm.D., BCPS, AAHIVP  Positive urine culture Treated with Keflex, organism sensitive to the same and no further patient follow-up is required at this time.  [place], Rex Kras 05/22/2014, 2:13 PM

## 2014-05-25 ENCOUNTER — Ambulatory Visit (INDEPENDENT_AMBULATORY_CARE_PROVIDER_SITE_OTHER): Admitting: Nurse Practitioner

## 2014-05-25 ENCOUNTER — Encounter: Payer: Self-pay | Admitting: Nurse Practitioner

## 2014-05-25 VITALS — BP 113/79 | HR 100 | Ht 64.0 in | Wt 163.0 lb

## 2014-05-25 DIAGNOSIS — Z5181 Encounter for therapeutic drug level monitoring: Secondary | ICD-10-CM

## 2014-05-25 DIAGNOSIS — G40909 Epilepsy, unspecified, not intractable, without status epilepticus: Secondary | ICD-10-CM

## 2014-05-25 MED ORDER — LEVETIRACETAM 1000 MG PO TABS: ORAL_TABLET | ORAL | Status: DC

## 2014-05-25 NOTE — Patient Instructions (Signed)
Continue Keppra at 1500mg  twice daily( one and 1/2 tabs twice daily)   Will check levels today Followup in 3 months

## 2014-05-25 NOTE — Progress Notes (Signed)
[facility]  PATIENT: Isabella Jenkins DOB: 1975/11/29   REASON FOR VISIT: Followup for seizure disorder   HISTORY OF PRESENT ILLNESS: Ms. Isabella Jenkins, 38 year old female returns for followup. She was seen in the office 04/22/2014 by Dr. Krista Blue. She has had epilepsy since the age of 58 and is currently pregnant. She had a visit to the emergency room 05/18/2014 for seizure and her Keppra was increased to 1500 mg twice daily at that time. She has not had further seizure activity since ER visit. She returns for reevaluation   HISTORY: Isabella Jenkins is a 67 right-handed female, [facility] by her primary care [facility], managing her seizure medications, she is currently [redacted] weeks pregnant, this is the first time I have seen her,  She has suffered generalized epilepsy disorder since age 77, over the years, she has been treated with titrating dose of Depakote, she is currently taking Depakote ER 500 mg 3 tablets every night, as long as she is taking her medications, she has no recurrent seizures, the last seizure was in 2012,  She also reported she had two successful pregnancy while taking Depakote, she has 79 years old daughter, 72 years old son, both are healthy,  She moved to [place] in 2014, reviewed the record, she presented to emergency room multiple times over past 1 year for different reasons, including jaw pain, low back pain, refill her antiepileptic medications, laboratory in December 2014 showed Depakote level 55, mild anemia hemoglobin of 11.5  Last the dose of Depakote was last night June 24th 2015, Depakote level was drawn in June 24th, level 58.,  She reported that she has been taking her prenatal vitamin regularly over past few months, has not been on extra folic acid supplements,  She also had past medical history of history gastric bypass surgery in 2010,   REVIEW OF SYSTEMS: Full 14 system  review of systems performed and notable only for those listed, all others are neg:  Constitutional: N/A  Cardiovascular: N/A  Ear/Nose/Throat: N/A  Skin: N/A  Eyes: N/A  Respiratory: N/A  Gastroitestinal: N/A  Hematology/Lymphatic: Easy bruising Endocrine: N/A Musculoskeletal:N/A  Allergy/Immunology: N/A  Neurological: Seizure  Psychiatric: N/A Sleep : NA   ALLERGIES: Allergies  Allergen Reactions  . Ibuprofen     Can't take with gastric surgery  . Tramadol Other (See Comments)    Can't take with seizure medication.     HOME MEDICATIONS: Outpatient Prescriptions Prior to Visit  Medication Sig Dispense Refill  . folic acid (FOLVITE) 1 MG tablet Take 2 mg by mouth daily.      . Prenatal Vit-Fe Fumarate-FA (PRENATAL MULTIVITAMIN) TABS tablet Take 1 tablet by mouth daily at 12 noon.      . cephALEXin (KEFLEX) 500 MG capsule Take 1 capsule (500 mg total) by mouth 3 (three) times daily.  28 capsule  0  . levETIRAcetam (KEPPRA) 1000 MG tablet Take 1 tablet (1,000 mg total) by mouth 2 (two) times daily.  60 tablet  12  . levETIRAcetam (KEPPRA) 500 MG tablet Take 3 tablets (1,500 mg total) by mouth 2 (two) times daily.  60 tablet  0   No facility-administered medications prior to visit.    PAST MEDICAL HISTORY: Past Medical History  Diagnosis Date  . Seizures   . Depression   . Anxiety   . Mood swings   . Hypertension     PAST SURGICAL HISTORY: Past Surgical History  Procedure  Laterality Date  . Gastric bypass    . Wisdom tooth extraction      FAMILY HISTORY: Family History  Problem Relation Age of Onset  . High blood pressure Mother   . High Cholesterol Mother   . Stroke Father   . High blood pressure Father     SOCIAL HISTORY: History   Social History  . Marital Status: Married    Spouse Name: N/A    Number of Children: 2  . Years of Education: 12   Occupational History  .      [employer]   Social History Main Topics  . Smoking status: Former  Research scientist (life sciences)  . Smokeless tobacco: Never Used  . Alcohol Use: No  . Drug Use: No  . Sexual Activity: Yes    Birth Control/ Protection: None   Other Topics Concern  . Not on file   Social History Narrative   Patient lives at home with her children . Patient is divorcing. Patient works full time at [employer].    Education high school.   Right handed.   Caffeine one cup of coffee daily.     PHYSICAL EXAM  Filed Vitals:   05/25/14 1320  BP: 113/79  Pulse: 100  Height: 5\' 4"  (1.626 m)  Weight: 163 lb (73.936 kg)   Body mass index is 27.97 kg/(m^2). Generalized: In no acute distress  Neck: Supple, no carotid bruits  Cardiac: Regular rate rhythm  Pulmonary: Clear to auscultation bilaterally  Musculoskeletal: No deformity  Neurological examination  Mentation: Alert oriented to time, place, history taking, and causual conversation  Cranial nerve II-XII: Pupils were equal round reactive to light. Extraocular movements were full. Visual field were full on confrontational test. Bilateral fundi were sharp. Facial sensation and strength were normal. Hearing was intact to finger rubbing bilaterally. Uvula tongue midline. Head turning and shoulder shrug and were normal and symmetric.Tongue protrusion into cheek strength was normal.  Motor: Normal tone, bulk and strength.  Coordination: Normal finger to nose, heel-to-shin bilaterally  Gait: Rising up from seated position without assistance, normal stance,  moderate stride, good arm swing, smooth turning, able to perform tiptoe, and heel walking without difficulty.  Deep tendon reflexes: Brachioradialis 2/2, biceps 2/2, triceps 2/2, patellar 2/2, Achilles 2/2, plantar responses were flexor bilaterally.       DIAGNOSTIC DATA (LABS, IMAGING, TESTING) - I reviewed patient records, labs, notes, testing and imaging myself where available.  Lab Results  Component Value Date   WBC 6.4 10/15/2013   HGB 11.1* 10/15/2013   HCT 34.6* 10/15/2013    MCV 82.4 10/15/2013   PLT 243 10/15/2013      Component Value Date/Time   NA 137 05/18/2014 2330   K 3.8 05/18/2014 2330   CL 108 05/18/2014 2330   CO2 19 05/18/2014 2330   GLUCOSE 85 05/18/2014 2330   BUN 11 05/18/2014 2330   CREATININE 0.62 05/18/2014 2330   CALCIUM 7.9* 05/18/2014 2330   PROT 5.8* 05/18/2014 2330   ALBUMIN 2.7* 05/18/2014 2330   AST 11 05/18/2014 2330   ALT 9 05/18/2014 2330   ALKPHOS 53 05/18/2014 2330   BILITOT 0.4 05/18/2014 2330   GFRNONAA >90 05/18/2014 2330   GFRAA >90 05/18/2014 2330    Lab Results  Component Value Date   HGBA1C 4.9 03/04/2014     ASSESSMENT AND PLAN  38 y.o. year old female  has a past medical history of generalized seizure disorder, currently [redacted] weeks pregnant seizure event on 05/18/2014 ER visit.  Keppra increased to 1500 mg twice daily  Continue Keppra at 1500mg  twice daily( one and 1/2 tabs twice daily)  Will check levels today Followup in 3 months Dennie Bible, [facility], [facility], APRN  [facility] [address], [place], [place] [address] [phone]

## 2014-05-27 LAB — LEVETIRACETAM LEVEL: LEVETIRACETAM: 28.8 ug/mL (ref 10.0–40.0)

## 2014-05-27 NOTE — Progress Notes (Signed)
Quick Note:  Called and shared good keppra levels with patient thru VM message ______

## 2014-06-02 ENCOUNTER — Inpatient Hospital Stay (HOSPITAL_COMMUNITY)
Admission: AD | Admit: 2014-06-02 | Discharge: 2014-06-03 | Disposition: A | Discharge status: Home / Self Care | Source: Ambulatory Visit | Attending: Obstetrics and Gynecology | Admitting: Obstetrics and Gynecology

## 2014-06-02 DIAGNOSIS — O99350 Diseases of the nervous system complicating pregnancy, unspecified trimester: Principal | ICD-10-CM

## 2014-06-02 DIAGNOSIS — R569 Unspecified convulsions: Secondary | ICD-10-CM

## 2014-06-02 DIAGNOSIS — G40909 Epilepsy, unspecified, not intractable, without status epilepticus: Secondary | ICD-10-CM | POA: Insufficient documentation

## 2014-06-02 DIAGNOSIS — Z87891 Personal history of nicotine dependence: Secondary | ICD-10-CM | POA: Insufficient documentation

## 2014-06-02 LAB — URINALYSIS, ROUTINE W REFLEX MICROSCOPIC
Bilirubin Urine: NEGATIVE
GLUCOSE, UA: 250 mg/dL — AB
HGB URINE DIPSTICK: NEGATIVE
KETONES UR: NEGATIVE mg/dL
Leukocytes, UA: NEGATIVE
Nitrite: NEGATIVE
PH: 5.5 (ref 5.0–8.0)
Protein, ur: 30 mg/dL — AB
Specific Gravity, Urine: 1.025 (ref 1.005–1.030)
Urobilinogen, UA: 0.2 mg/dL (ref 0.0–1.0)

## 2014-06-02 LAB — RAPID URINE DRUG SCREEN, HOSP PERFORMED
AMPHETAMINES: NOT DETECTED
BARBITURATES: NOT DETECTED
BENZODIAZEPINES: NOT DETECTED
Cocaine: NOT DETECTED
Opiates: NOT DETECTED
TETRAHYDROCANNABINOL: NOT DETECTED

## 2014-06-02 LAB — URINE MICROSCOPIC-ADD ON

## 2014-06-02 MED ORDER — LEVETIRACETAM IN NACL 1500 MG/100ML IV SOLN
1500.0000 mg | Freq: Once | INTRAVENOUS | Status: AC
  Administered 2014-06-02: 1500 mg via INTRAVENOUS
  Filled 2014-06-02: qty 100

## 2014-06-02 NOTE — MAU Note (Signed)
Pt. States she had a seizure tonight at work around 1946. History of seizure disorder since age 38. Was on Depakote but recently Neurologist switched medication to something she cannot recall. Is unsure of EDC and does not want to answer any more questions at this time.

## 2014-06-02 NOTE — Discharge Instructions (Signed)

## 2014-06-02 NOTE — MAU Provider Note (Signed)
History     CSN: [id number]  Arrival date and time: 06/02/14 2032   First Provider Initiated Contact with Patient 06/02/14 2100      No chief complaint on file.  HPI  Isabella Jenkins is a 38 y.o. G1P0 at Unknown who presents today via EMS after a seizure at work. There is no one here to verify the type of seizure or how long the seizure lasted. The patient is fatigued currently, and states that she doesn't want to answer any questions. She states that she is 4-5 months pregnant, but she isn't "really sure". She is unsure of her LMP or EDC. She states that she takes Keppra 1500mg  BID. She states that she took her morning dose, but she has not taken her nighttime dose today. She is unwilling to answer what time she normally takes the evening dose. She was seen in the ED on 7/22 after a seizure and by neurology on 7/30.   Past Medical History  Diagnosis Date  . Seizures   . Depression   . Anxiety   . Mood swings   . Hypertension     Past Surgical History  Procedure Laterality Date  . Gastric bypass    . Wisdom tooth extraction      Family History  Problem Relation Age of Onset  . High blood pressure Mother   . High Cholesterol Mother   . Stroke Father   . High blood pressure Father     History  Substance Use Topics  . Smoking status: Former Research scientist (life sciences)  . Smokeless tobacco: Never Used  . Alcohol Use: No    Allergies:  Allergies  Allergen Reactions  . Ibuprofen     Can't take with gastric surgery  . Tramadol Other (See Comments)    Can't take with seizure medication.     Prescriptions prior to admission  Medication Sig Dispense Refill  . Prenatal Vit-Fe Fumarate-FA (PRENATAL MULTIVITAMIN) TABS tablet Take 1 tablet by mouth daily.       . folic acid (FOLVITE) 1 MG tablet Take 2 mg by mouth daily.      Marland Kitchen levETIRAcetam (KEPPRA) 1000 MG tablet 1.5 tabs twice daily  90 tablet  5    ROS Physical Exam   Last menstrual period 02/03/2014.  Physical Exam  Nursing note  and vitals reviewed. Constitutional: She is oriented to person, place, and time. She appears well-developed and well-nourished. No distress.  Cardiovascular: Normal rate.   Respiratory: Effort normal.  GI: Soft.  Neurological: She is alert and oriented to person, place, and time.  Fatigued and generally unwilling to answer questions   Skin: Skin is warm and dry.  Psychiatric: She has a normal mood and affect.  FHT 150 with doppler  MAU Course  Procedures  Results for orders placed during the hospital encounter of 06/02/14 (from the past 24 hour(s))  URINALYSIS, ROUTINE W REFLEX MICROSCOPIC     Status: Abnormal   Collection Time    06/02/14  9:25 PM      Result Value Ref Range   Color, Urine YELLOW  YELLOW   APPearance CLEAR  CLEAR   Specific Gravity, Urine 1.025  1.005 - 1.030   pH 5.5  5.0 - 8.0   Glucose, UA 250 (*) NEGATIVE mg/dL   Hgb urine dipstick NEGATIVE  NEGATIVE   Bilirubin Urine NEGATIVE  NEGATIVE   Ketones, ur NEGATIVE  NEGATIVE mg/dL   Protein, ur 30 (*) NEGATIVE mg/dL   Urobilinogen, UA 0.2  0.0 -  1.0 mg/dL   Nitrite NEGATIVE  NEGATIVE   Leukocytes, UA NEGATIVE  NEGATIVE  URINE RAPID DRUG SCREEN (HOSP PERFORMED)     Status: None   Collection Time    06/02/14  9:25 PM      Result Value Ref Range   Opiates NONE DETECTED  NONE DETECTED   Cocaine NONE DETECTED  NONE DETECTED   Benzodiazepines NONE DETECTED  NONE DETECTED   Amphetamines NONE DETECTED  NONE DETECTED   Tetrahydrocannabinol NONE DETECTED  NONE DETECTED   Barbiturates NONE DETECTED  NONE DETECTED  URINE MICROSCOPIC-ADD ON     Status: Abnormal   Collection Time    06/02/14  9:25 PM      Result Value Ref Range   Squamous Epithelial / LPF RARE  RARE   WBC, UA 0-2  <3 WBC/hpf   RBC / HPF 0-2  <3 RBC/hpf   Bacteria, UA FEW (*) RARE    2145: LVVM with Dr. Ouida Sills  2158: D/W Dr. Ouida Sills call her neurologist for plan of care.  2204: Consult with Dr. Nicole Kindred with neurology: give evening dose of  Keppra 1500 mg IV now, and then may dc home to FU with [facility] neurology tomorrow.  Assessment and Plan   1. Seizure    1500 mg Keppra given IV here Instructed to continue Keppra 1500mg  BID Return to ED as needed FU with OB as planned  Follow-up Information   Follow up with [facility] In 1 day.   Contact information:   [address] [place] [address] [phone]       Mathis Bud 06/02/2014, 9:01 PM

## 2014-06-03 ENCOUNTER — Telehealth: Payer: Self-pay | Admitting: Neurology

## 2014-06-03 NOTE — Telephone Encounter (Signed)
Patient calling to state that she had a seizure last night and she's also pregnant, states that she needs to be seen as soon as possible.

## 2014-06-04 ENCOUNTER — Ambulatory Visit (INDEPENDENT_AMBULATORY_CARE_PROVIDER_SITE_OTHER): Admitting: Neurology

## 2014-06-04 ENCOUNTER — Encounter: Payer: Self-pay | Admitting: Neurology

## 2014-06-04 VITALS — BP 115/81 | HR 86 | Ht 64.0 in | Wt 161.0 lb

## 2014-06-04 DIAGNOSIS — G40909 Epilepsy, unspecified, not intractable, without status epilepticus: Secondary | ICD-10-CM

## 2014-06-04 MED ORDER — DIVALPROEX SODIUM ER 500 MG PO TB24: 1500.0000 mg | ORAL_TABLET | Freq: Every day | ORAL | Status: DC

## 2014-06-04 NOTE — Telephone Encounter (Signed)
Dr.Yan had a cx  Called and spoke to patient she is coming in today 06-04-2014 to see Dr.Yan.

## 2014-06-04 NOTE — Patient Instructions (Addendum)
I have explained the potential teratogenic side effects from Depakote, she voiced understanding, we will start her back on previous dosage of Depakote ER 500 mg 3 tablets every night, prescription was written,.  Tapering off Keppra, she is currently taking 1000 milligrams, one and half tablets twice a day First 3 days, 1 tablets twice a day Followed by half tablets twice a day for 3 days Then stop,  She will be on monotherapy of Depakote ER 500 mg 3 tablets every night  No driving till seizure free for 6 months

## 2014-06-04 NOTE — Progress Notes (Signed)
[facility]  PATIENT: Isabella Jenkins DOB: 1976-02-10   REASON FOR VISIT: Followup for seizure disorder   HISTORY OF PRESENT ILLNESS:    History: Isabella Jenkins is a 38 right-handed female, referred by her primary care Dr. Annitta [facility] for [facility] of seizure, managing her seizure medications, I saw her first time in June 25th 2015 when she is  [redacted] weeks pregnant,   She has suffered generalized epilepsy disorder since age 47, over the years, she has been treated with titrating dose of Depakote, she is currently taking Depakote ER 500 mg 3 tablets every night, as long as she is taking her medications, she has no recurrent seizures, the last seizure was in 2012,  She also reported she had two successful pregnancy while taking Depakote, she has 63 years old daughter, 68 years old son, both are healthy  She moved to [place] in 2014, reviewed the record, she presented to emergency room multiple times over past 1 year for different reasons, including jaw pain, low back pain, refill her antiepileptic medications, laboratory in December 2014 showed Depakote level 55, mild anemia hemoglobin of 11.5  Last the dose of Depakote was last night June 24th 2015, Depakote level was drawn in June 24th, level 54.  She reported that she has been taking her prenatal vitamin regularly over past few months, has not been on extra folic acid supplements,  She also had past medical history of history gastric bypass surgery in 2010,  UPDATE August 7th 2015: She started keppra since June 25th 2015, Depakote was stopped, she had 2 seizure since, the first was July 21st, while taking Keppra 1000 mg twice a day, the second episode was, August 5th 2015, while working at [employer], no warning signs, generalized tonic-clonic seizure, Keppra was increased to 1000mg   1 and half tablets twice a day,,   She is currently 36 and 1/2 weeks pregant, taking folic acid,  1mg  qday, prenatal vitamine  EEG was abnormal, showed generalized epileptiform discharge, she is very frustrated, with a recurrent seizure, wants to go back on Depakote, which works well for her in the past She reported she had amniocentesis in July 31st, 2015 at [place] by Dr. Philis Pique, report came back normal  I have reviewed information about Depakote during pregnancy, it is category  Rating X  Studies in animals or human beings have demonstrated fetal abnormalities or there is evidence of fetal risk based on human experience or both, and the risk of the use of the drug in pregnant women clearly outweighs any possible benefit. The drug is contraindicated in women who are or may become pregnant.  She voiced understanding, understanding the potential risk associated with Depakote use during the pregnancy, she still wants to go back on Depakote,  REVIEW OF SYSTEMS: Full 14 system review of systems performed and notable only for those listed, all others are neg: Headache, seizure     ALLERGIES: Allergies  Allergen Reactions  . Ibuprofen Other (See Comments)    Can't take due to gastric surgery.   . Tramadol Other (See Comments)    Can't take this medication with seizure medication.     HOME MEDICATIONS: Outpatient Prescriptions Prior to Visit  Medication Sig Dispense Refill  . folic acid (FOLVITE) 1 MG tablet Take 1 mg by mouth daily.       Marland Kitchen levETIRAcetam (KEPPRA) 1000 MG tablet Take 1,500 mg by mouth 2 (two) times daily. Pt takes 1.5 tablets  in the morning and 1.5 tablets at bedtime.      . nitrofurantoin, macrocrystal-monohydrate, (MACROBID) 100 MG capsule Take 100 mg by mouth daily. Pt states she only has three days left of this medication.      . Prenatal Vit-Fe Fumarate-FA (PRENATAL MULTIVITAMIN) TABS tablet Take 1 tablet by mouth daily.        No facility-administered medications prior to visit.    PAST MEDICAL HISTORY: Past Medical History  Diagnosis Date  .  Seizures   . Depression   . Anxiety   . Mood swings   . Hypertension     PAST SURGICAL HISTORY: Past Surgical History  Procedure Laterality Date  . Gastric bypass    . Wisdom tooth extraction      FAMILY HISTORY: Family History  Problem Relation Age of Onset  . High blood pressure Mother   . High Cholesterol Mother   . Stroke Father   . High blood pressure Father     SOCIAL HISTORY: History   Social History  . Marital Status: Married    Spouse Name: N/A    Number of Children: 2  . Years of Education: 12   Occupational History  .      [employer]   Social History Main Topics  . Smoking status: Former Research scientist (life sciences)  . Smokeless tobacco: Never Used  . Alcohol Use: No  . Drug Use: No  . Sexual Activity: Yes    Birth Control/ Protection: None   Other Topics Concern  . Not on file   Social History Narrative   Patient lives at home with her children . Patient is divorcing. Patient works full time at [employer].    Education high school.   Right handed.   Caffeine one cup of coffee daily.     PHYSICAL EXAM  Filed Vitals:   06/04/14 1511  BP: 115/81  Pulse: 86  Height: 5\' 4"  (1.626 m)  Weight: 161 lb (73.029 kg)   Body mass index is 27.62 kg/(m^2). Generalized: In no acute distress  Neck: Supple, no carotid bruits  Cardiac: Regular rate rhythm  Pulmonary: Clear to auscultation bilaterally  Musculoskeletal: No deformity  Neurological examination  Mentation: Alert oriented to time, place, history taking, and causual conversation  Cranial nerve II-XII: Pupils were equal round reactive to light. Extraocular movements were full. Visual field were full on confrontational test. Bilateral fundi were sharp. Facial sensation and strength were normal. Hearing was intact to finger rubbing bilaterally. Uvula tongue midline. Head turning and shoulder shrug and were normal and symmetric.Tongue protrusion into cheek strength was normal.  Motor: Normal tone, bulk and strength.    Coordination: Normal finger to nose, heel-to-shin bilaterally  Gait: Rising up from seated position without assistance, normal stance,  moderate stride, good arm swing, smooth turning, able to perform tiptoe, and heel walking without difficulty.  Deep tendon reflexes: Brachioradialis 2/2, biceps 2/2, triceps 2/2, patellar 2/2, Achilles 2/2, plantar responses were flexor bilaterally.       DIAGNOSTIC DATA (LABS, IMAGING, TESTING) - I reviewed patient records, labs, notes, testing and imaging myself where available.  Lab Results  Component Value Date   WBC 6.4 10/15/2013   HGB 11.1* 10/15/2013   HCT 34.6* 10/15/2013   MCV 82.4 10/15/2013   PLT 243 10/15/2013      Component Value Date/Time   NA 137 05/18/2014 2330   K 3.8 05/18/2014 2330   CL 108 05/18/2014 2330   CO2 19 05/18/2014 2330   GLUCOSE 85 05/18/2014  2330   BUN 11 05/18/2014 2330   CREATININE 0.62 05/18/2014 2330   CALCIUM 7.9* 05/18/2014 2330   PROT 5.8* 05/18/2014 2330   ALBUMIN 2.7* 05/18/2014 2330   AST 11 05/18/2014 2330   ALT 9 05/18/2014 2330   ALKPHOS 53 05/18/2014 2330   BILITOT 0.4 05/18/2014 2330   GFRNONAA >90 05/18/2014 2330   GFRAA >90 05/18/2014 2330    Lab Results  Component Value Date   HGBA1C 4.9 03/04/2014     ASSESSMENT AND PLAN  38 y.o. year old female  has a past medical history of generalized seizure disorder, currently [redacted] weeks pregnant , she had recurrent seizure, while taking Keppra titrating dose, design to go back on previous dose of Depakote, undistended potential risks, Depakote is Rating X  Studies in animals or human beings have demonstrated fetal abnormalities or there is evidence of fetal risk based on human experience or both, and the risk of the use of the drug in pregnant women clearly outweighs any possible benefit. The drug is contraindicated in women who are or may become pregnant.  Restart her Depakote ER 500 mg 3 tablets every night Tapering off Keppra She should continue with daily  folic acid 1 mg once a day Return to clinic in 1  months Refer to [employer] at [facility] [address] [place],  [address] [phone]

## 2014-06-07 ENCOUNTER — Ambulatory Visit: Admitting: Internal Medicine

## 2014-06-10 ENCOUNTER — Telehealth: Payer: Self-pay | Admitting: *Deleted

## 2014-06-10 ENCOUNTER — Inpatient Hospital Stay (HOSPITAL_COMMUNITY)
Admission: EM | Admit: 2014-06-10 | Discharge: 2014-06-12 | DRG: 493 | Disposition: A | Discharge status: Home Health | Attending: Orthopedic Surgery | Admitting: Orthopedic Surgery

## 2014-06-10 ENCOUNTER — Inpatient Hospital Stay (HOSPITAL_COMMUNITY): Admitting: Certified Registered Nurse Anesthetist

## 2014-06-10 ENCOUNTER — Encounter (HOSPITAL_COMMUNITY): Payer: Self-pay | Admitting: Emergency Medicine

## 2014-06-10 ENCOUNTER — Inpatient Hospital Stay (HOSPITAL_COMMUNITY)

## 2014-06-10 ENCOUNTER — Emergency Department (HOSPITAL_COMMUNITY)

## 2014-06-10 ENCOUNTER — Encounter (HOSPITAL_COMMUNITY): Admission: EM | Disposition: A | Discharge status: Home Health | Payer: Self-pay | Source: Home / Self Care

## 2014-06-10 ENCOUNTER — Encounter (HOSPITAL_COMMUNITY): Admitting: Certified Registered Nurse Anesthetist

## 2014-06-10 DIAGNOSIS — Z3490 Encounter for supervision of normal pregnancy, unspecified, unspecified trimester: Secondary | ICD-10-CM

## 2014-06-10 DIAGNOSIS — S0003XA Contusion of scalp, initial encounter: Secondary | ICD-10-CM | POA: Diagnosis present

## 2014-06-10 DIAGNOSIS — F411 Generalized anxiety disorder: Secondary | ICD-10-CM | POA: Diagnosis present

## 2014-06-10 DIAGNOSIS — S1093XA Contusion of unspecified part of neck, initial encounter: Secondary | ICD-10-CM

## 2014-06-10 DIAGNOSIS — S8253XB Displaced fracture of medial malleolus of unspecified tibia, initial encounter for open fracture type I or II: Secondary | ICD-10-CM

## 2014-06-10 DIAGNOSIS — F39 Unspecified mood [affective] disorder: Secondary | ICD-10-CM | POA: Diagnosis present

## 2014-06-10 DIAGNOSIS — D62 Acute posthemorrhagic anemia: Secondary | ICD-10-CM | POA: Diagnosis present

## 2014-06-10 DIAGNOSIS — Z9884 Bariatric surgery status: Secondary | ICD-10-CM

## 2014-06-10 DIAGNOSIS — S82892A Other fracture of left lower leg, initial encounter for closed fracture: Secondary | ICD-10-CM

## 2014-06-10 DIAGNOSIS — Z886 Allergy status to analgesic agent status: Secondary | ICD-10-CM

## 2014-06-10 DIAGNOSIS — S92109A Unspecified fracture of unspecified talus, initial encounter for closed fracture: Secondary | ICD-10-CM | POA: Diagnosis present

## 2014-06-10 DIAGNOSIS — G40909 Epilepsy, unspecified, not intractable, without status epilepticus: Secondary | ICD-10-CM | POA: Diagnosis present

## 2014-06-10 DIAGNOSIS — S92009A Unspecified fracture of unspecified calcaneus, initial encounter for closed fracture: Secondary | ICD-10-CM | POA: Diagnosis present

## 2014-06-10 DIAGNOSIS — Z823 Family history of stroke: Secondary | ICD-10-CM

## 2014-06-10 DIAGNOSIS — O99350 Diseases of the nervous system complicating pregnancy, unspecified trimester: Secondary | ICD-10-CM

## 2014-06-10 DIAGNOSIS — S82872B Displaced pilon fracture of left tibia, initial encounter for open fracture type I or II: Secondary | ICD-10-CM | POA: Diagnosis present

## 2014-06-10 DIAGNOSIS — S93314A Dislocation of tarsal joint of right foot, initial encounter: Secondary | ICD-10-CM | POA: Diagnosis present

## 2014-06-10 DIAGNOSIS — M25461 Effusion, right knee: Secondary | ICD-10-CM | POA: Diagnosis present

## 2014-06-10 DIAGNOSIS — Y92410 Unspecified street and highway as the place of occurrence of the external cause: Secondary | ICD-10-CM

## 2014-06-10 DIAGNOSIS — S82891A Other fracture of right lower leg, initial encounter for closed fracture: Secondary | ICD-10-CM

## 2014-06-10 DIAGNOSIS — Z87891 Personal history of nicotine dependence: Secondary | ICD-10-CM

## 2014-06-10 DIAGNOSIS — S83429A Sprain of lateral collateral ligament of unspecified knee, initial encounter: Secondary | ICD-10-CM | POA: Diagnosis present

## 2014-06-10 DIAGNOSIS — S0083XA Contusion of other part of head, initial encounter: Secondary | ICD-10-CM | POA: Diagnosis present

## 2014-06-10 DIAGNOSIS — S82899B Other fracture of unspecified lower leg, initial encounter for open fracture type I or II: Principal | ICD-10-CM | POA: Diagnosis present

## 2014-06-10 DIAGNOSIS — S9306XA Dislocation of unspecified ankle joint, initial encounter: Secondary | ICD-10-CM | POA: Diagnosis present

## 2014-06-10 DIAGNOSIS — Z8349 Family history of other endocrine, nutritional and metabolic diseases: Secondary | ICD-10-CM

## 2014-06-10 DIAGNOSIS — O10019 Pre-existing essential hypertension complicating pregnancy, unspecified trimester: Secondary | ICD-10-CM | POA: Diagnosis present

## 2014-06-10 DIAGNOSIS — G40309 Generalized idiopathic epilepsy and epileptic syndromes, not intractable, without status epilepticus: Secondary | ICD-10-CM | POA: Diagnosis present

## 2014-06-10 HISTORY — PX: EXTERNAL FIXATION LEG: SHX1549

## 2014-06-10 HISTORY — PX: ORIF FIBULA FRACTURE: SHX5114

## 2014-06-10 HISTORY — PX: ANKLE CLOSED REDUCTION: SHX880

## 2014-06-10 HISTORY — PX: I & D EXTREMITY: SHX5045

## 2014-06-10 LAB — COMPREHENSIVE METABOLIC PANEL
ALK PHOS: 66 U/L (ref 39–117)
ALT: 16 U/L (ref 0–35)
ANION GAP: 17 — AB (ref 5–15)
AST: 17 U/L (ref 0–37)
Albumin: 2.9 g/dL — ABNORMAL LOW (ref 3.5–5.2)
BILIRUBIN TOTAL: 0.5 mg/dL (ref 0.3–1.2)
BUN: 7 mg/dL (ref 6–23)
CHLORIDE: 103 meq/L (ref 96–112)
CO2: 17 mEq/L — ABNORMAL LOW (ref 19–32)
Calcium: 8.8 mg/dL (ref 8.4–10.5)
Creatinine, Ser: 0.61 mg/dL (ref 0.50–1.10)
GFR calc non Af Amer: >90 mL/min (ref >90)
GLUCOSE: 97 mg/dL (ref 70–99)
POTASSIUM: 3.8 meq/L (ref 3.7–5.3)
Sodium: 137 mEq/L (ref 137–147)
Total Protein: 6.8 g/dL (ref 6.0–8.3)

## 2014-06-10 LAB — PREPARE FRESH FROZEN PLASMA
Unit division: 0
Unit division: 0

## 2014-06-10 LAB — VALPROIC ACID LEVEL
Valproic Acid Lvl: 44.7 ug/mL — ABNORMAL LOW (ref 50.0–100.0)
Valproic Acid Lvl: 39.5 ug/mL — ABNORMAL LOW (ref 50.0–100.0)

## 2014-06-10 LAB — PROTIME-INR
INR: 0.98 (ref 0.00–1.49)
PROTHROMBIN TIME: 13 s (ref 11.6–15.2)

## 2014-06-10 LAB — ETHANOL

## 2014-06-10 LAB — CBC
HCT: 31.4 % — ABNORMAL LOW (ref 36.0–46.0)
Hemoglobin: 10.1 g/dL — ABNORMAL LOW (ref 12.0–15.0)
MCH: 26.6 pg (ref 26.0–34.0)
MCHC: 32.2 g/dL (ref 30.0–36.0)
MCV: 82.8 fL (ref 78.0–100.0)
Platelets: 288 10*3/uL (ref 150–400)
RBC: 3.79 MIL/uL — ABNORMAL LOW (ref 3.87–5.11)
RDW: 14.8 % (ref 11.5–15.5)
WBC: 12.3 10*3/uL — ABNORMAL HIGH (ref 4.0–10.5)

## 2014-06-10 LAB — HCG, QUANTITATIVE, PREGNANCY: hCG, Beta Chain, Quant, S: 26824 m[IU]/mL — ABNORMAL HIGH (ref <5)

## 2014-06-10 LAB — SAMPLE TO BLOOD BANK

## 2014-06-10 LAB — MRSA PCR SCREENING: MRSA by PCR: NEGATIVE

## 2014-06-10 LAB — CDS SEROLOGY

## 2014-06-10 SURGERY — EXTERNAL FIXATION, LOWER EXTREMITY
Anesthesia: Regional | Site: Ankle | Laterality: Right

## 2014-06-10 MED ORDER — FENTANYL CITRATE 0.05 MG/ML IJ SOLN
INTRAMUSCULAR | Status: AC
  Filled 2014-06-10: qty 5

## 2014-06-10 MED ORDER — FENTANYL CITRATE 0.05 MG/ML IJ SOLN
INTRAMUSCULAR | Status: DC | PRN
  Administered 2014-06-10 (×5): 50 ug via INTRAVENOUS
  Administered 2014-06-10: 100 ug via INTRAVENOUS
  Administered 2014-06-10 (×3): 50 ug via INTRAVENOUS

## 2014-06-10 MED ORDER — CEFAZOLIN SODIUM-DEXTROSE 2-3 GM-% IV SOLR
INTRAVENOUS | Status: AC
  Administered 2014-06-10: 2000 mg via INTRAVENOUS
  Filled 2014-06-10: qty 50

## 2014-06-10 MED ORDER — ROCURONIUM BROMIDE 50 MG/5ML IV SOLN
INTRAVENOUS | Status: AC
  Filled 2014-06-10: qty 1

## 2014-06-10 MED ORDER — MORPHINE SULFATE 2 MG/ML IJ SOLN
2.0000 mg | INTRAMUSCULAR | Status: DC | PRN
  Administered 2014-06-10 – 2014-06-11 (×8): 2 mg via INTRAVENOUS
  Filled 2014-06-10 (×7): qty 1

## 2014-06-10 MED ORDER — ONDANSETRON HCL 4 MG PO TABS: 4.0000 mg | ORAL_TABLET | Freq: Four times a day (QID) | ORAL | Status: DC | PRN

## 2014-06-10 MED ORDER — PHENYLEPHRINE HCL 10 MG/ML IJ SOLN
INTRAMUSCULAR | Status: DC | PRN
  Administered 2014-06-10: 80 ug via INTRAVENOUS

## 2014-06-10 MED ORDER — ONDANSETRON HCL 4 MG/2ML IJ SOLN
INTRAMUSCULAR | Status: AC
  Filled 2014-06-10: qty 2

## 2014-06-10 MED ORDER — ONDANSETRON HCL 4 MG/2ML IJ SOLN
2.0000 mg | Freq: Once | INTRAMUSCULAR | Status: AC
  Administered 2014-06-10: 2 mg via INTRAVENOUS

## 2014-06-10 MED ORDER — MORPHINE SULFATE 2 MG/ML IJ SOLN
INTRAMUSCULAR | Status: AC
  Administered 2014-06-10: 2 mg via INTRAMUSCULAR
  Filled 2014-06-10: qty 1

## 2014-06-10 MED ORDER — FENTANYL CITRATE 0.05 MG/ML IJ SOLN
25.0000 ug | INTRAMUSCULAR | Status: DC | PRN
  Administered 2014-06-10 (×5): 50 ug via INTRAVENOUS

## 2014-06-10 MED ORDER — ONDANSETRON HCL 4 MG/2ML IJ SOLN
INTRAMUSCULAR | Status: DC | PRN
  Administered 2014-06-10: 4 mg via INTRAVENOUS

## 2014-06-10 MED ORDER — ARTIFICIAL TEARS OP OINT
TOPICAL_OINTMENT | OPHTHALMIC | Status: DC | PRN
  Administered 2014-06-10: 1 via OPHTHALMIC

## 2014-06-10 MED ORDER — SODIUM CHLORIDE 0.9 % IV SOLN
INTRAVENOUS | Status: AC | PRN
  Administered 2014-06-10: 1000 mL via INTRAVENOUS

## 2014-06-10 MED ORDER — FENTANYL CITRATE 0.05 MG/ML IJ SOLN: 50.0000 ug | INTRAMUSCULAR | Status: DC | PRN

## 2014-06-10 MED ORDER — CEFAZOLIN SODIUM-DEXTROSE 2-3 GM-% IV SOLR
INTRAVENOUS | Status: AC
  Filled 2014-06-10: qty 50

## 2014-06-10 MED ORDER — NEOSTIGMINE METHYLSULFATE 10 MG/10ML IV SOLN
INTRAVENOUS | Status: AC
  Filled 2014-06-10: qty 1

## 2014-06-10 MED ORDER — ARTIFICIAL TEARS OP OINT
TOPICAL_OINTMENT | OPHTHALMIC | Status: AC
  Filled 2014-06-10: qty 3.5

## 2014-06-10 MED ORDER — ROCURONIUM BROMIDE 100 MG/10ML IV SOLN
INTRAVENOUS | Status: DC | PRN
  Administered 2014-06-10: 10 mg via INTRAVENOUS
  Administered 2014-06-10: 20 mg via INTRAVENOUS

## 2014-06-10 MED ORDER — FENTANYL CITRATE 0.05 MG/ML IJ SOLN
INTRAMUSCULAR | Status: AC
  Administered 2014-06-10: 50 ug via INTRAVENOUS
  Filled 2014-06-10: qty 2

## 2014-06-10 MED ORDER — MORPHINE SULFATE 4 MG/ML IJ SOLN: 4.0000 mg | Freq: Once | INTRAMUSCULAR | Status: AC

## 2014-06-10 MED ORDER — NEOSTIGMINE METHYLSULFATE 10 MG/10ML IV SOLN
INTRAVENOUS | Status: DC | PRN
  Administered 2014-06-10: 3 mg via INTRAVENOUS

## 2014-06-10 MED ORDER — SODIUM CHLORIDE 0.9 % IR SOLN
Status: DC | PRN
  Administered 2014-06-10 (×2): 3000 mL
  Administered 2014-06-10: 1000 mL

## 2014-06-10 MED ORDER — KCL IN DEXTROSE-NACL 20-5-0.45 MEQ/L-%-% IV SOLN
INTRAVENOUS | Status: DC
  Administered 2014-06-11: 09:00:00 via INTRAVENOUS
  Filled 2014-06-10 (×5): qty 1000

## 2014-06-10 MED ORDER — SUCCINYLCHOLINE CHLORIDE 20 MG/ML IJ SOLN
INTRAMUSCULAR | Status: DC | PRN
  Administered 2014-06-10: 100 mg via INTRAVENOUS

## 2014-06-10 MED ORDER — FOLIC ACID 1 MG PO TABS
1.0000 mg | ORAL_TABLET | Freq: Every day | ORAL | Status: DC
  Administered 2014-06-10 – 2014-06-12 (×3): 1 mg via ORAL
  Filled 2014-06-10 (×3): qty 1

## 2014-06-10 MED ORDER — LACTATED RINGERS IV SOLN
INTRAVENOUS | Status: DC | PRN
  Administered 2014-06-10 (×2): via INTRAVENOUS

## 2014-06-10 MED ORDER — MORPHINE SULFATE 2 MG/ML IJ SOLN
1.0000 mg | INTRAMUSCULAR | Status: DC | PRN
  Administered 2014-06-10: 2 mg via INTRAVENOUS
  Filled 2014-06-10: qty 1

## 2014-06-10 MED ORDER — GLYCOPYRROLATE 0.2 MG/ML IJ SOLN
INTRAMUSCULAR | Status: AC
  Filled 2014-06-10: qty 2

## 2014-06-10 MED ORDER — CEFAZOLIN SODIUM 1-5 GM-% IV SOLN
1.0000 g | Freq: Three times a day (TID) | INTRAVENOUS | Status: AC
  Administered 2014-06-10 – 2014-06-11 (×3): 1 g via INTRAVENOUS
  Filled 2014-06-10 (×3): qty 50

## 2014-06-10 MED ORDER — MORPHINE SULFATE 2 MG/ML IJ SOLN
INTRAMUSCULAR | Status: AC
  Administered 2014-06-10: 4 mg via INTRAVENOUS
  Filled 2014-06-10: qty 2

## 2014-06-10 MED ORDER — DIVALPROEX SODIUM ER 500 MG PO TB24
1500.0000 mg | ORAL_TABLET | Freq: Every day | ORAL | Status: DC
  Administered 2014-06-10 – 2014-06-11 (×2): 1500 mg via ORAL
  Filled 2014-06-10 (×4): qty 3

## 2014-06-10 MED ORDER — CEFAZOLIN SODIUM 1-5 GM-% IV SOLN: 1.0000 g | Freq: Once | INTRAVENOUS | Status: DC

## 2014-06-10 MED ORDER — METOCLOPRAMIDE HCL 5 MG/ML IJ SOLN
INTRAMUSCULAR | Status: DC | PRN
  Administered 2014-06-10: 10 mg via INTRAVENOUS

## 2014-06-10 MED ORDER — LIDOCAINE HCL (CARDIAC) 20 MG/ML IV SOLN
INTRAVENOUS | Status: AC
  Filled 2014-06-10: qty 5

## 2014-06-10 MED ORDER — MIDAZOLAM HCL 2 MG/2ML IJ SOLN
INTRAMUSCULAR | Status: AC
  Filled 2014-06-10: qty 2

## 2014-06-10 MED ORDER — MORPHINE SULFATE 2 MG/ML IJ SOLN
INTRAMUSCULAR | Status: AC
  Administered 2014-06-10: 2 mg via INTRAVENOUS
  Filled 2014-06-10: qty 1

## 2014-06-10 MED ORDER — DEXAMETHASONE SODIUM PHOSPHATE 4 MG/ML IJ SOLN
INTRAMUSCULAR | Status: AC
  Filled 2014-06-10: qty 2

## 2014-06-10 MED ORDER — DEXAMETHASONE SODIUM PHOSPHATE 4 MG/ML IJ SOLN
INTRAMUSCULAR | Status: DC | PRN
  Administered 2014-06-10: 8 mg via INTRAVENOUS

## 2014-06-10 MED ORDER — DOCUSATE SODIUM 100 MG PO CAPS
100.0000 mg | ORAL_CAPSULE | Freq: Two times a day (BID) | ORAL | Status: DC
  Administered 2014-06-10: 100 mg via ORAL
  Filled 2014-06-10: qty 1

## 2014-06-10 MED ORDER — KCL IN DEXTROSE-NACL 20-5-0.45 MEQ/L-%-% IV SOLN
INTRAVENOUS | Status: AC
Start: 2014-06-10 — End: 2014-06-10
  Filled 2014-06-10: qty 1000

## 2014-06-10 MED ORDER — CEFAZOLIN SODIUM-DEXTROSE 2-3 GM-% IV SOLR
2.0000 g | Freq: Once | INTRAVENOUS | Status: AC
  Administered 2014-06-10: 2 g via INTRAVENOUS

## 2014-06-10 MED ORDER — ONDANSETRON HCL 4 MG/2ML IJ SOLN
4.0000 mg | Freq: Four times a day (QID) | INTRAMUSCULAR | Status: DC | PRN
  Administered 2014-06-11: 4 mg via INTRAVENOUS

## 2014-06-10 MED ORDER — GLYCOPYRROLATE 0.2 MG/ML IJ SOLN
INTRAMUSCULAR | Status: DC | PRN
  Administered 2014-06-10: 0.4 mg via INTRAVENOUS

## 2014-06-10 MED ORDER — ACETAMINOPHEN 325 MG PO TABS: 650.0000 mg | ORAL_TABLET | ORAL | Status: DC | PRN

## 2014-06-10 MED ORDER — METOCLOPRAMIDE HCL 5 MG/ML IJ SOLN
INTRAMUSCULAR | Status: AC
  Filled 2014-06-10: qty 2

## 2014-06-10 MED ORDER — OXYCODONE HCL 5 MG PO TABS
5.0000 mg | ORAL_TABLET | ORAL | Status: DC | PRN
  Administered 2014-06-10 – 2014-06-11 (×2): 10 mg via ORAL
  Filled 2014-06-10 (×2): qty 2

## 2014-06-10 MED ORDER — LIDOCAINE HCL (CARDIAC) 20 MG/ML IV SOLN
INTRAVENOUS | Status: DC | PRN
  Administered 2014-06-10: 80 mg via INTRAVENOUS

## 2014-06-10 MED ORDER — SUCCINYLCHOLINE CHLORIDE 20 MG/ML IJ SOLN
INTRAMUSCULAR | Status: AC
  Filled 2014-06-10: qty 1

## 2014-06-10 MED ORDER — PROPOFOL 10 MG/ML IV BOLUS
INTRAVENOUS | Status: AC
  Filled 2014-06-10: qty 20

## 2014-06-10 MED ORDER — PRENATAL MULTIVITAMIN CH
1.0000 | ORAL_TABLET | Freq: Every day | ORAL | Status: DC
  Administered 2014-06-10 – 2014-06-12 (×3): 1 via ORAL
  Filled 2014-06-10 (×3): qty 1

## 2014-06-10 MED ORDER — ONDANSETRON HCL 4 MG/2ML IJ SOLN: 4.0000 mg | Freq: Four times a day (QID) | INTRAMUSCULAR | Status: DC | PRN

## 2014-06-10 MED ORDER — PROPOFOL 10 MG/ML IV BOLUS
INTRAVENOUS | Status: DC | PRN
  Administered 2014-06-10: 160 mg via INTRAVENOUS

## 2014-06-10 MED ORDER — ONDANSETRON HCL 4 MG/2ML IJ SOLN
4.0000 mg | Freq: Once | INTRAMUSCULAR | Status: AC
  Administered 2014-06-10: 4 mg via INTRAVENOUS

## 2014-06-10 MED ORDER — ONDANSETRON HCL 4 MG/2ML IJ SOLN: 4.0000 mg | Freq: Once | INTRAMUSCULAR | Status: DC | PRN

## 2014-06-10 MED ORDER — FENTANYL CITRATE 0.05 MG/ML IJ SOLN
INTRAMUSCULAR | Status: AC
  Filled 2014-06-10: qty 2

## 2014-06-10 MED ORDER — CEFAZOLIN SODIUM-DEXTROSE 2-3 GM-% IV SOLR
2.0000 g | Freq: Once | INTRAVENOUS | Status: AC
  Administered 2014-06-10: 2000 mg via INTRAVENOUS

## 2014-06-10 MED ADMIN — KCl 20 MEQ/L (0.15%) in Dextrose 5% & NaCl 0.45% Inj: INTRAVENOUS | @ 16:00:00

## 2014-06-10 SURGICAL SUPPLY — 83 items
BANDAGE ELASTIC 3 VELCRO ST LF (GAUZE/BANDAGES/DRESSINGS) ×2 IMPLANT
BANDAGE ELASTIC 4 VELCRO ST LF (GAUZE/BANDAGES/DRESSINGS) ×4 IMPLANT
BANDAGE ELASTIC 6 VELCRO ST LF (GAUZE/BANDAGES/DRESSINGS) ×4 IMPLANT
BANDAGE ESMARK 6X9 LF (GAUZE/BANDAGES/DRESSINGS) ×2 IMPLANT
BAR 11X100 (Bar) ×2 IMPLANT
BAR EXFX 350X11 NS LF (EXFIX) ×4
BAR GLASS FIBER EXFX 11X350 (EXFIX) ×4 IMPLANT
BIT DRILL 2.5X110 QC LCP DISP (BIT) ×2 IMPLANT
BLADE SURG 10 STRL SS (BLADE) ×4 IMPLANT
BNDG CMPR 9X6 STRL LF SNTH (GAUZE/BANDAGES/DRESSINGS) ×2
BNDG CMPR MD 5X2 ELC HKLP STRL (GAUZE/BANDAGES/DRESSINGS) ×2
BNDG COHESIVE 4X5 TAN STRL (GAUZE/BANDAGES/DRESSINGS) ×4 IMPLANT
BNDG COHESIVE 6X5 TAN STRL LF (GAUZE/BANDAGES/DRESSINGS) ×4 IMPLANT
BNDG ELASTIC 2 VLCR STRL LF (GAUZE/BANDAGES/DRESSINGS) ×2 IMPLANT
BNDG ESMARK 6X9 LF (GAUZE/BANDAGES/DRESSINGS) ×4
BNDG GAUZE ELAST 4 BULKY (GAUZE/BANDAGES/DRESSINGS) ×6 IMPLANT
BNDG GAUZE STRTCH 6 (GAUZE/BANDAGES/DRESSINGS) ×12 IMPLANT
BRUSH SCRUB DISP (MISCELLANEOUS) ×8 IMPLANT
CLAMP BLUE BAR TO BAR (MISCELLANEOUS) ×6 IMPLANT
CLAMP BLUE BAR TO PIN (MISCELLANEOUS) ×6 IMPLANT
CLEANER TIP ELECTROSURG 2X2 (MISCELLANEOUS) ×4 IMPLANT
COVER SURGICAL LIGHT HANDLE (MISCELLANEOUS) ×8 IMPLANT
DRAPE C-ARM 42X72 X-RAY (DRAPES) ×2 IMPLANT
DRAPE U-SHAPE 47X51 STRL (DRAPES) ×4 IMPLANT
DRSG ADAPTIC 3X8 NADH LF (GAUZE/BANDAGES/DRESSINGS) ×4 IMPLANT
ELECT REM PT RETURN 9FT ADLT (ELECTROSURGICAL) ×4
ELECTRODE REM PT RTRN 9FT ADLT (ELECTROSURGICAL) ×2 IMPLANT
EXTERNAL FIX BAR ×2 IMPLANT
EXTERNAL FIXATOR BAR ×4 IMPLANT
GAUZE SPONGE 4X4 12PLY STRL (GAUZE/BANDAGES/DRESSINGS) ×4 IMPLANT
GLOVE BIO SURGEON STRL SZ7 (GLOVE) ×4 IMPLANT
GLOVE BIO SURGEON STRL SZ7.5 (GLOVE) ×6 IMPLANT
GLOVE BIO SURGEON STRL SZ8 (GLOVE) ×6 IMPLANT
GLOVE BIOGEL PI IND STRL 7.0 (GLOVE) IMPLANT
GLOVE BIOGEL PI IND STRL 7.5 (GLOVE) ×2 IMPLANT
GLOVE BIOGEL PI IND STRL 8 (GLOVE) ×2 IMPLANT
GLOVE BIOGEL PI INDICATOR 7.0 (GLOVE) ×4
GLOVE BIOGEL PI INDICATOR 7.5 (GLOVE) ×2
GLOVE BIOGEL PI INDICATOR 8 (GLOVE) ×4
GLOVE SURG SS PI 7.0 STRL IVOR (GLOVE) ×4 IMPLANT
GOWN STRL REUS W/ TWL LRG LVL3 (GOWN DISPOSABLE) ×4 IMPLANT
GOWN STRL REUS W/ TWL XL LVL3 (GOWN DISPOSABLE) ×2 IMPLANT
GOWN STRL REUS W/TWL LRG LVL3 (GOWN DISPOSABLE) ×8
GOWN STRL REUS W/TWL XL LVL3 (GOWN DISPOSABLE) ×4
HALF PIN 3MM (PIN) ×4 IMPLANT
HANDPIECE INTERPULSE COAX TIP (DISPOSABLE) ×4
KIT BASIN OR (CUSTOM PROCEDURE TRAY) ×4 IMPLANT
KIT ROOM TURNOVER OR (KITS) ×4 IMPLANT
MANIFOLD NEPTUNE II (INSTRUMENTS) ×4 IMPLANT
NEEDLE 22X1 1/2 (OR ONLY) (NEEDLE) ×2 IMPLANT
NS IRRIG 1000ML POUR BTL (IV SOLUTION) ×4 IMPLANT
PACK ORTHO EXTREMITY (CUSTOM PROCEDURE TRAY) ×4 IMPLANT
PAD ARMBOARD 7.5X6 YLW CONV (MISCELLANEOUS) ×8 IMPLANT
PADDING CAST COTTON 6X4 STRL (CAST SUPPLIES) ×12 IMPLANT
PIN CLAMP 2BAR 75MM BLUE (PIN) ×2 IMPLANT
PIN HALF YELLOW 5X160X35 (PIN) ×4 IMPLANT
PIN TRANSFIXING 5.0 (PIN) ×2 IMPLANT
PLATE LCP 3.5 1/3 TUB 8HX93 (Plate) ×2 IMPLANT
SCREW CANC FT 4.0X20 (Screw) ×2 IMPLANT
SCREW CANC FT ST SFS 4X14 (Screw) ×2 IMPLANT
SCREW CANC PT 4.0X20 (Screw) ×2 IMPLANT
SCREW CORTEX 3.5 14MM (Screw) ×4 IMPLANT
SCREW CORTEX 3.5 16MM (Screw) ×2 IMPLANT
SCREW LOCK CORT ST 3.5X14 (Screw) IMPLANT
SCREW LOCK CORT ST 3.5X16 (Screw) IMPLANT
SET HNDPC FAN SPRY TIP SCT (DISPOSABLE) IMPLANT
SPLINT PLASTER CAST XFAST 5X30 (CAST SUPPLIES) IMPLANT
SPLINT PLASTER XFAST SET 5X30 (CAST SUPPLIES) ×2
SPONGE GAUZE 4X4 12PLY STER LF (GAUZE/BANDAGES/DRESSINGS) ×2 IMPLANT
SPONGE LAP 18X18 X RAY DECT (DISPOSABLE) ×4 IMPLANT
STAPLER VISISTAT 35W (STAPLE) IMPLANT
STOCKINETTE IMPERVIOUS 9X36 MD (GAUZE/BANDAGES/DRESSINGS) ×4 IMPLANT
STOCKINETTE IMPERVIOUS LG (DRAPES) ×4 IMPLANT
SUCTION FRAZIER TIP 10 FR DISP (SUCTIONS) ×2 IMPLANT
SUT ETHILON 3 0 PS 1 (SUTURE) ×2 IMPLANT
SUT PDS AB 2-0 CT1 27 (SUTURE) ×2 IMPLANT
SYR CONTROL 10ML LL (SYRINGE) ×2 IMPLANT
TOWEL OR 17X24 6PK STRL BLUE (TOWEL DISPOSABLE) ×8 IMPLANT
TOWEL OR 17X26 10 PK STRL BLUE (TOWEL DISPOSABLE) ×8 IMPLANT
TUBE CONNECTING 12'X1/4 (SUCTIONS) ×1
TUBE CONNECTING 12X1/4 (SUCTIONS) ×3 IMPLANT
UNDERPAD 30X30 INCONTINENT (UNDERPADS AND DIAPERS) ×4 IMPLANT
YANKAUER SUCT BULB TIP NO VENT (SUCTIONS) ×4 IMPLANT
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
## 2014-06-10 NOTE — Progress Notes (Signed)
UR completed.  Royce Sciara, RN BSN [facility] CCM Trauma/Neuro ICU Case Manager [phone]  
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
## 2014-06-10 NOTE — Progress Notes (Signed)
Report given to Lauren RN.

## 2014-06-10 NOTE — Transfer of Care (Signed)
Immediate Anesthesia Transfer of Care Note  Patient: Isabella Jenkins  Procedure(s) Performed: Procedure(s): EXTERNAL FIXATION LEFT PILON FRACTURE (Left) IRRIGATION AND DEBRIDEMENT LEFT OPEN FRACTURE; INCLUDING BONE (Left) CLOSED REDUCTION AND SPLINTING RIGHT TALUS FRACTURE DISLOCATION (Right) OPEN REDUCTION INTERNAL FIXATION (ORIF) PILON FRACTURE, FIBULA ONLY (Left)  Patient Location: PACU  Anesthesia Type:General  Level of Consciousness: awake, alert , oriented and patient cooperative  Airway & Oxygen Therapy: Patient Spontanous Breathing and Patient connected to nasal cannula oxygen  Post-op Assessment: Report given to PACU RN, Post -op Vital signs reviewed and stable and Patient moving all extremities X 4  Post vital signs: Reviewed and stable  Complications: No apparent anesthesia complications
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
## 2014-06-10 NOTE — ED Notes (Signed)
Care handoff given to Silas [place], RN

## 2014-06-10 NOTE — Progress Notes (Signed)
Notified Pt OBGYN, Dr. Philis Pique of pts status. MD ordered Fetal Heart Tones once a day, and notify if pt starts to have vaginal bleeding. Etta Quill, RN

## 2014-06-10 NOTE — Progress Notes (Signed)
Patient has a soft spot, feels like fluid filled, spot on the top of her head. No pain related to area, just concerned. Dr. Linna Caprice notified and states he will come by to evaluate patient.

## 2014-06-10 NOTE — Telephone Encounter (Signed)
Tried to give patients sister a call back her phone is ringing busy.

## 2014-06-10 NOTE — ED Notes (Signed)
Pt repositioned on left side with pillow under feet. Pt awake, alert, moaning in pain, Dr. Hulen Skains at bedside. New orders received.

## 2014-06-10 NOTE — Anesthesia Postprocedure Evaluation (Signed)
  Anesthesia Post-op Note  Patient: Isabella Jenkins  Procedure(s) Performed: Procedure(s): EXTERNAL FIXATION LEFT PILON FRACTURE (Left) IRRIGATION AND DEBRIDEMENT LEFT OPEN FRACTURE; INCLUDING BONE (Left) CLOSED REDUCTION AND SPLINTING RIGHT TALUS FRACTURE DISLOCATION (Right) OPEN REDUCTION INTERNAL FIXATION (ORIF) PILON FRACTURE, FIBULA ONLY (Left)  Patient Location: PACU  Anesthesia Type:General  Level of Consciousness: awake, alert  and oriented  Airway and Oxygen Therapy: Patient Spontanous Breathing and Patient connected to nasal cannula oxygen  Post-op Pain: mild  Post-op Assessment: Post-op Vital signs reviewed, Patient's Cardiovascular Status Stable, Respiratory Function Stable, Patent Airway and No signs of Nausea or vomiting  Post-op Vital Signs: stable  Last Vitals:  Filed Vitals:   06/10/14 1500  BP: 134/83  Pulse: 100  Temp: 37.1 C  Resp: 10    Complications: No apparent anesthesia complications
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
## 2014-06-11 ENCOUNTER — Inpatient Hospital Stay (HOSPITAL_COMMUNITY)

## 2014-06-11 ENCOUNTER — Encounter (HOSPITAL_COMMUNITY): Payer: Self-pay | Admitting: Orthopedic Surgery

## 2014-06-11 DIAGNOSIS — D62 Acute posthemorrhagic anemia: Secondary | ICD-10-CM

## 2014-06-11 DIAGNOSIS — S83429A Sprain of lateral collateral ligament of unspecified knee, initial encounter: Secondary | ICD-10-CM | POA: Diagnosis present

## 2014-06-11 DIAGNOSIS — M25461 Effusion, right knee: Secondary | ICD-10-CM | POA: Diagnosis present

## 2014-06-11 DIAGNOSIS — Z3490 Encounter for supervision of normal pregnancy, unspecified, unspecified trimester: Secondary | ICD-10-CM

## 2014-06-11 LAB — CBC
HEMATOCRIT: 21.5 % — AB (ref 36.0–46.0)
Hemoglobin: 7 g/dL — ABNORMAL LOW (ref 12.0–15.0)
MCH: 26.2 pg (ref 26.0–34.0)
MCHC: 32.6 g/dL (ref 30.0–36.0)
MCV: 80.5 fL (ref 78.0–100.0)
Platelets: 221 10*3/uL (ref 150–400)
RBC: 2.67 MIL/uL — ABNORMAL LOW (ref 3.87–5.11)
RDW: 14.4 % (ref 11.5–15.5)
WBC: 11.3 10*3/uL — ABNORMAL HIGH (ref 4.0–10.5)

## 2014-06-11 LAB — BASIC METABOLIC PANEL
Anion gap: 13 (ref 5–15)
BUN: 3 mg/dL — AB (ref 6–23)
CALCIUM: 8.1 mg/dL — AB (ref 8.4–10.5)
CO2: 22 meq/L (ref 19–32)
Chloride: 104 mEq/L (ref 96–112)
Creatinine, Ser: 0.56 mg/dL (ref 0.50–1.10)
GFR calc Af Amer: >90 mL/min (ref >90)
GLUCOSE: 100 mg/dL — AB (ref 70–99)
Potassium: 4 mEq/L (ref 3.7–5.3)
Sodium: 139 mEq/L (ref 137–147)

## 2014-06-11 LAB — ABO/RH: ABO/RH(D): A POS

## 2014-06-11 LAB — PREPARE RBC (CROSSMATCH)

## 2014-06-11 MED ORDER — DOCUSATE SODIUM 100 MG PO CAPS: 100.0000 mg | ORAL_CAPSULE | Freq: Two times a day (BID) | ORAL | Status: DC

## 2014-06-11 MED ORDER — HYDROCODONE-ACETAMINOPHEN 5-325 MG PO TABS
0.5000 | ORAL_TABLET | ORAL | Status: DC | PRN
  Administered 2014-06-11 – 2014-06-12 (×6): 2 via ORAL
  Filled 2014-06-11 (×6): qty 2

## 2014-06-11 MED ORDER — MORPHINE SULFATE 2 MG/ML IJ SOLN
2.0000 mg | INTRAMUSCULAR | Status: DC | PRN
  Administered 2014-06-11 (×3): 2 mg via INTRAVENOUS
  Filled 2014-06-11 (×3): qty 1

## 2014-06-11 MED ORDER — POLYETHYLENE GLYCOL 3350 17 G PO PACK: 17.0000 g | PACK | Freq: Every day | ORAL | Status: DC

## 2014-06-11 MED ORDER — SODIUM CHLORIDE 0.9 % IV SOLN
Freq: Once | INTRAVENOUS | Status: AC
  Administered 2014-06-11: 11:00:00 via INTRAVENOUS

## 2014-06-11 MED ORDER — ENOXAPARIN SODIUM 40 MG/0.4ML ~~LOC~~ SOLN
40.0000 mg | SUBCUTANEOUS | Status: DC
  Administered 2014-06-11 – 2014-06-12 (×2): 40 mg via SUBCUTANEOUS
  Filled 2014-06-11 (×2): qty 0.4

## 2014-06-11 NOTE — Progress Notes (Signed)
Spoke with patient in room about DME and HH needs for potential d/c over weekend. Pt reports needs only for drop-arm 3-in-1 and wheelchair with bilat elevated leg rests.  [facility] chosen for HHPT as pt's family member used it in the past. Address and phone number confirmed as correct.

## 2014-06-11 NOTE — Discharge Instructions (Addendum)
Orthopaedic Trauma Service Discharge Instructions   General Discharge Instructions  WEIGHT BEARING STATUS: Nonweightbearing Bilaterally, bed to chair transfers only   RANGE OF MOTION/ACTIVITY: as tolerated while maintaining weightbearing restrictions   Wound Care: dressing changes to Left leg will be done at your first office visit   Diet: as you were eating previously.  Can use over the counter stool softeners and bowel preparations, such as Miralax, to help with bowel movements.  Narcotics can be constipating.  Be sure to drink plenty of fluids  STOP SMOKING OR USING NICOTINE PRODUCTS!!!!  As discussed nicotine severely impairs your body's ability to heal surgical and traumatic wounds but also impairs bone healing.  Wounds and bone heal by forming microscopic blood vessels (angiogenesis) and nicotine is a vasoconstrictor (essentially, shrinks blood vessels).  Therefore, if vasoconstriction occurs to these microscopic blood vessels they essentially disappear and are unable to deliver necessary nutrients to the healing tissue.  This is one modifiable factor that you can do to dramatically increase your chances of healing your injury.    (This means no smoking, no nicotine gum, patches, etc)  DO NOT USE NONSTEROIDAL ANTI-INFLAMMATORY DRUGS (NSAID'S)  Using products such as Advil (ibuprofen), Aleve (naproxen), Motrin (ibuprofen) for additional pain control during fracture healing can delay and/or prevent the healing response.  If you would like to take over the counter (OTC) medication, Tylenol (acetaminophen) is ok.  However, some narcotic medications that are given for pain control contain acetaminophen as well. Therefore, you should not exceed more than 4000 mg of tylenol in a day if you do not have liver disease.  Also note that there are may OTC medicines, such as cold medicines and allergy medicines that my contain tylenol as well.  If you have any questions about medications and/or  interactions please ask your doctor/PA or your pharmacist.   PAIN MEDICATION USE AND EXPECTATIONS  You have likely been given narcotic medications to help control your pain.  After a traumatic event that results in an fracture (broken bone) with or without surgery, it is ok to use narcotic pain medications to help control one's pain.  We understand that everyone responds to pain differently and each individual patient will be evaluated on a regular basis for the continued need for narcotic medications. Ideally, narcotic medication use should last no more than 6-8 weeks (coinciding with fracture healing).   As a patient it is your responsibility as well to monitor narcotic medication use and report the amount and frequency you use these medications when you come to your office visit.   We would also advise that if you are using narcotic medications, you should take a dose prior to therapy to maximize you participation.  IF YOU ARE ON NARCOTIC MEDICATIONS IT IS NOT PERMISSIBLE TO OPERATE A MOTOR VEHICLE (MOTORCYCLE/CAR/TRUCK/MOPED) OR HEAVY MACHINERY DO NOT MIX NARCOTICS WITH OTHER CNS (CENTRAL NERVOUS SYSTEM) DEPRESSANTS SUCH AS ALCOHOL       ICE AND ELEVATE INJURED/OPERATIVE EXTREMITY  Using ice and elevating the injured extremity above your heart can help with swelling and pain control.  Icing in a pulsatile fashion, such as 20 minutes on and 20 minutes off, can be followed.    Do not place ice directly on skin. Make sure there is a barrier between to skin and the ice pack.    Using frozen items such as frozen peas works well as the conform nicely to the are that needs to be iced.  USE AN ACE WRAP OR TED  HOSE FOR SWELLING CONTROL  In addition to icing and elevation, Ace wraps or TED hose are used to help limit and resolve swelling.  It is recommended to use Ace wraps or TED hose until you are informed to stop.    When using Ace Wraps start the wrapping distally (farthest away from the body) and  wrap proximally (closer to the body)   Example: If you had surgery on your leg or thing and you do not have a splint on, start the ace wrap at the toes and work your way up to the thigh        If you had surgery on your upper extremity and do not have a splint on, start the ace wrap at your fingers and work your way up to the upper arm  IF YOU ARE IN A SPLINT OR CAST DO NOT [place]   If your splint gets wet for any reason please contact the office immediately. You may shower in your splint or cast as long as you keep it dry.  This can be done by wrapping in a cast cover or garbage back (or similar)  Do Not stick any thing down your splint or cast such as pencils, money, or hangers to try and scratch yourself with.  If you feel itchy take benadryl as prescribed on the bottle for itching  IF YOU ARE IN A CAM BOOT (BLACK BOOT)  You may remove boot periodically. Perform daily dressing changes as noted below.  Wash the liner of the boot regularly and wear a sock when wearing the boot. It is recommended that you sleep in the boot until told otherwise  CALL THE OFFICE WITH ANY QUESTIONS OR CONCERTS: [phone]     Discharge Pin Site Instructions  Dress pins daily with Kerlix roll starting on POD 2. Wrap the Kerlix so that it tamps the skin down around the pin-skin interface to prevent/limit motion of the skin relative to the pin.  (Pin-skin motion is the primary cause of pain and infection related to external fixator pin sites).  Remove any crust or coagulum that may obstruct drainage with a saline moistened gauze or soap and water.  After POD 3, if there is no discernable drainage on the pin site dressing, the interval for change can by increased to every other day.  You may shower with the fixator, cleaning all pin sites gently with soap and water.  If you have a surgical wound this needs to be completely dry and without drainage before showering.  The extremity can be lifted  by the fixator to facilitate wound care and transfers.  Notify the office/Doctor if you experience increasing drainage, redness, or pain from a pin site, or if you notice purulent (thick, snot-like) drainage.  Discharge Wound Care Instructions  Do NOT apply any ointments, solutions or lotions to pin sites or surgical wounds.  These prevent needed drainage and even though solutions like hydrogen peroxide kill bacteria, they also damage cells lining the pin sites that help fight infection.  Applying lotions or ointments can keep the wounds moist and can cause them to breakdown and open up as well. This can increase the risk for infection. When in doubt call the office.  Surgical incisions should be dressed daily.  If any drainage is noted, use one layer of adaptic, then gauze, Kerlix, and an ace wrap.  Once the incision is completely dry and without drainage, it may be left open to air out.  Showering may begin 36-48 hours later.  Cleaning gently with soap and water.  Traumatic wounds should be dressed daily as well.    One layer of adaptic, gauze, Kerlix, then ace wrap.  The adaptic can be discontinued once the draining has ceased    If you have a wet to dry dressing: wet the gauze with saline the squeeze as much saline out so the gauze is moist (not soaking wet), place moistened gauze over wound, then place a dry gauze over the moist one, followed by Kerlix wrap, then ace wrap.

## 2014-06-11 NOTE — Progress Notes (Signed)
Pt transferred to 5N03. Family present and aware.
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
## 2014-06-11 NOTE — Progress Notes (Signed)
Report called to [place] on 5N.
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
## 2014-06-11 NOTE — Progress Notes (Signed)
Pt is an 18w 2d gestation inpatient. Fetal Heart tones checked with doppler, FH 155bpm with spont accel heard. Pt reports fetal movement through out the day. No complaints of uterine cramping or contractions. No ROM or vag bleeding. Has no OB related questions or concerns at this time.
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
## 2014-06-11 NOTE — Progress Notes (Signed)
Called [facility] and notified them of pt transfer.

## 2014-06-11 NOTE — Progress Notes (Signed)
Orthopedic Tech Progress Note Patient Details:  Isabella Jenkins Nov 02, 1975 [id number]  Ortho Devices Ortho Device/Splint Location: put ohf on bed Ortho Device/Splint Interventions: Ordered;Application   Braulio Bosch 06/11/2014, 9:08 AM

## 2014-06-11 NOTE — Plan of Care (Signed)
Problem: Acute Rehab PT Goals(only PT should resolve) Goal: PT Additional Goal #1 Pt and family will be able to verbalize and/or demo proper stair management technique to ensure safe transition home.
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
## 2014-06-11 NOTE — Progress Notes (Signed)
Hemoglobin is 3 points down.  Giving 2 units of PRBCs.  Okay to transfer to the floor.  This patient has been seen and I agree with the findings and treatment plan.  Kathryne Eriksson. Dahlia Bailiff, MD, [place] [phone] (pager) [phone] (direct pager) Trauma Surgeon

## 2014-06-11 NOTE — Progress Notes (Signed)
I saw and examined the patient with Mr. Eddie Dibbles, communicating the findings and plan noted above.  VERY, very limited WB on R leg for bed to chair transfers only, NWB preferred to extent feasible.  Altamese Wamsutter, MD [facility] [phone] (p)

## 2014-06-12 LAB — CBC
HEMATOCRIT: 24.3 % — AB (ref 36.0–46.0)
Hemoglobin: 8 g/dL — ABNORMAL LOW (ref 12.0–15.0)
MCH: 26.8 pg (ref 26.0–34.0)
MCHC: 32.9 g/dL (ref 30.0–36.0)
MCV: 81.3 fL (ref 78.0–100.0)
Platelets: 197 10*3/uL (ref 150–400)
RBC: 2.99 MIL/uL — ABNORMAL LOW (ref 3.87–5.11)
RDW: 15 % (ref 11.5–15.5)
WBC: 9.2 10*3/uL (ref 4.0–10.5)

## 2014-06-12 LAB — TYPE AND SCREEN
ABO/RH(D): A POS
ANTIBODY SCREEN: NEGATIVE
Unit division: 0
Unit division: 0

## 2014-06-12 LAB — VALPROIC ACID LEVEL: Valproic Acid Lvl: 74.6 ug/mL (ref 50.0–100.0)

## 2014-06-12 MED ORDER — ENOXAPARIN SODIUM 40 MG/0.4ML ~~LOC~~ SOLN: 40.0000 mg | SUBCUTANEOUS | Status: DC
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
## 2014-06-12 NOTE — Progress Notes (Signed)
OT Cancellation Note  Patient Details Name: Isabella Jenkins MRN: [id number] DOB: 08-31-1976   Cancelled Treatment:    Reason Eval/Treat Not Completed: Patient declined, no reason specified. OT made visit this date for evaluation. Pt reports that her family will help with all ADLs and transfers and she has no concerns. Acute OT to sign off.   Juluis Rainier [phone] 06/12/2014, 10:42 AM
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
## 2014-06-15 ENCOUNTER — Encounter (HOSPITAL_COMMUNITY): Payer: Self-pay | Admitting: *Deleted
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
## 2014-06-21 NOTE — Progress Notes (Signed)
Discussed and agree with above 

## 2014-06-22 DIAGNOSIS — G40909 Epilepsy, unspecified, not intractable, without status epilepticus: Secondary | ICD-10-CM | POA: Diagnosis not present

## 2014-06-22 DIAGNOSIS — S92109A Unspecified fracture of unspecified talus, initial encounter for closed fracture: Secondary | ICD-10-CM | POA: Diagnosis not present

## 2014-06-22 DIAGNOSIS — O169 Unspecified maternal hypertension, unspecified trimester: Secondary | ICD-10-CM | POA: Diagnosis not present

## 2014-06-22 DIAGNOSIS — F329 Major depressive disorder, single episode, unspecified: Secondary | ICD-10-CM | POA: Diagnosis not present

## 2014-06-22 DIAGNOSIS — O99891 Other specified diseases and conditions complicating pregnancy: Secondary | ICD-10-CM | POA: Diagnosis not present

## 2014-06-22 DIAGNOSIS — O99340 Other mental disorders complicating pregnancy, unspecified trimester: Secondary | ICD-10-CM | POA: Diagnosis not present

## 2014-06-22 DIAGNOSIS — F3289 Other specified depressive episodes: Secondary | ICD-10-CM | POA: Diagnosis not present

## 2014-06-22 DIAGNOSIS — Z87891 Personal history of nicotine dependence: Secondary | ICD-10-CM | POA: Diagnosis not present

## 2014-06-22 DIAGNOSIS — Z9884 Bariatric surgery status: Secondary | ICD-10-CM | POA: Diagnosis not present

## 2014-06-22 DIAGNOSIS — S82899B Other fracture of unspecified lower leg, initial encounter for open fracture type I or II: Secondary | ICD-10-CM | POA: Diagnosis present

## 2014-06-22 DIAGNOSIS — D649 Anemia, unspecified: Secondary | ICD-10-CM | POA: Diagnosis not present

## 2014-06-22 DIAGNOSIS — O99019 Anemia complicating pregnancy, unspecified trimester: Secondary | ICD-10-CM | POA: Diagnosis not present

## 2014-06-22 DIAGNOSIS — F411 Generalized anxiety disorder: Secondary | ICD-10-CM | POA: Diagnosis not present

## 2014-06-22 DIAGNOSIS — Z4789 Encounter for other orthopedic aftercare: Secondary | ICD-10-CM | POA: Diagnosis not present

## 2014-06-23 ENCOUNTER — Encounter (HOSPITAL_COMMUNITY): Payer: Self-pay | Admitting: *Deleted

## 2014-06-23 NOTE — Interval H&P Note (Signed)
History and Physical Interval Note:  Swelling improved Left ankle Wrinkles with gentle compression Distal motor and sensory functions intact  Ext warm + DP pulse Ex fix stable  Pt approx [redacted] weeks pregnant  No additional changes in exam    Isabella Jenkins  has presented today for surgery, with the diagnosis of Left open pilon fracture, right talus fracture dislocation  The various methods of treatment have been discussed with the patient and family. After consideration of risks, benefits and other options for treatment, the patient has consented to  Procedure(s): OPEN REDUCTION INTERNAL FIXATION (ORIF) TIBIA PILON  FRACTURE WITH REMOVAL OF EXTERNAL FIXATURE  (Left) as a surgical intervention .  The patient's history has been reviewed, patient examined, no change in status, stable for surgery.  I have reviewed the patient's chart and labs.  Questions were answered to the patient's satisfaction.     Jari Pigg, PA-C [facility] [phone] (P)  06/23/2014 5:51 PM
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
## 2014-06-23 NOTE — Progress Notes (Signed)
Pt denies SOB, chest pain, and being under the care of a cardiologist. Pt denies having any cardiac studies ( stress test, echo and cardiac cath) and a chest x ray within the last year. Pt stated that she had a past medical history of hypertension but is no longer being treated in addition to testing positive for sleep apnea and not using a CPAP/BIPAP since gastric bypass surgery. Pt stated that her last dose of Lovenox was 06/22/14. Dr. Marcie Bal ( anesthesia) made aware that pt is [redacted] weeks pregnant.

## 2014-06-24 ENCOUNTER — Ambulatory Visit (HOSPITAL_COMMUNITY)

## 2014-06-24 ENCOUNTER — Ambulatory Visit (HOSPITAL_COMMUNITY): Admitting: Anesthesiology

## 2014-06-24 ENCOUNTER — Observation Stay (HOSPITAL_COMMUNITY)
Admission: RE | Admit: 2014-06-24 | Discharge: 2014-06-25 | Disposition: A | Discharge status: Home / Self Care | Source: Ambulatory Visit | Attending: Orthopedic Surgery | Admitting: Orthopedic Surgery

## 2014-06-24 ENCOUNTER — Observation Stay (HOSPITAL_COMMUNITY)

## 2014-06-24 ENCOUNTER — Encounter (HOSPITAL_COMMUNITY): Payer: Self-pay | Admitting: Certified Registered Nurse Anesthetist

## 2014-06-24 ENCOUNTER — Encounter (HOSPITAL_COMMUNITY)
Admission: RE | Disposition: A | Discharge status: Home / Self Care | Payer: Self-pay | Source: Ambulatory Visit | Attending: Orthopedic Surgery

## 2014-06-24 ENCOUNTER — Other Ambulatory Visit: Payer: Self-pay | Admitting: Obstetrics & Gynecology

## 2014-06-24 ENCOUNTER — Encounter (HOSPITAL_COMMUNITY): Admitting: Anesthesiology

## 2014-06-24 DIAGNOSIS — O9989 Other specified diseases and conditions complicating pregnancy, childbirth and the puerperium: Principal | ICD-10-CM

## 2014-06-24 DIAGNOSIS — D649 Anemia, unspecified: Secondary | ICD-10-CM | POA: Insufficient documentation

## 2014-06-24 DIAGNOSIS — Z9884 Bariatric surgery status: Secondary | ICD-10-CM | POA: Insufficient documentation

## 2014-06-24 DIAGNOSIS — S92109A Unspecified fracture of unspecified talus, initial encounter for closed fracture: Secondary | ICD-10-CM | POA: Insufficient documentation

## 2014-06-24 DIAGNOSIS — O99019 Anemia complicating pregnancy, unspecified trimester: Secondary | ICD-10-CM | POA: Insufficient documentation

## 2014-06-24 DIAGNOSIS — Z87891 Personal history of nicotine dependence: Secondary | ICD-10-CM | POA: Insufficient documentation

## 2014-06-24 DIAGNOSIS — Z3490 Encounter for supervision of normal pregnancy, unspecified, unspecified trimester: Secondary | ICD-10-CM

## 2014-06-24 DIAGNOSIS — O99340 Other mental disorders complicating pregnancy, unspecified trimester: Secondary | ICD-10-CM | POA: Insufficient documentation

## 2014-06-24 DIAGNOSIS — F32A Depression, unspecified: Secondary | ICD-10-CM | POA: Diagnosis present

## 2014-06-24 DIAGNOSIS — F419 Anxiety disorder, unspecified: Secondary | ICD-10-CM | POA: Diagnosis present

## 2014-06-24 DIAGNOSIS — S82872D Displaced pilon fracture of left tibia, subsequent encounter for closed fracture with routine healing: Secondary | ICD-10-CM

## 2014-06-24 DIAGNOSIS — O99891 Other specified diseases and conditions complicating pregnancy: Principal | ICD-10-CM | POA: Insufficient documentation

## 2014-06-24 DIAGNOSIS — F329 Major depressive disorder, single episode, unspecified: Secondary | ICD-10-CM | POA: Insufficient documentation

## 2014-06-24 DIAGNOSIS — F411 Generalized anxiety disorder: Secondary | ICD-10-CM | POA: Insufficient documentation

## 2014-06-24 DIAGNOSIS — G40909 Epilepsy, unspecified, not intractable, without status epilepticus: Secondary | ICD-10-CM | POA: Insufficient documentation

## 2014-06-24 DIAGNOSIS — F3289 Other specified depressive episodes: Secondary | ICD-10-CM | POA: Insufficient documentation

## 2014-06-24 DIAGNOSIS — S82872A Displaced pilon fracture of left tibia, initial encounter for closed fracture: Secondary | ICD-10-CM | POA: Diagnosis present

## 2014-06-24 DIAGNOSIS — Z4789 Encounter for other orthopedic aftercare: Secondary | ICD-10-CM | POA: Insufficient documentation

## 2014-06-24 DIAGNOSIS — O169 Unspecified maternal hypertension, unspecified trimester: Secondary | ICD-10-CM | POA: Insufficient documentation

## 2014-06-24 DIAGNOSIS — S82899B Other fracture of unspecified lower leg, initial encounter for open fracture type I or II: Secondary | ICD-10-CM | POA: Insufficient documentation

## 2014-06-24 HISTORY — PX: ORIF ANKLE FRACTURE: SHX5408

## 2014-06-24 HISTORY — PX: CAST APPLICATION: SHX380

## 2014-06-24 LAB — COMPREHENSIVE METABOLIC PANEL
ALT: 10 U/L (ref 0–35)
AST: 10 U/L (ref 0–37)
Albumin: 2.2 g/dL — ABNORMAL LOW (ref 3.5–5.2)
Alkaline Phosphatase: 83 U/L (ref 39–117)
Anion gap: 12 (ref 5–15)
BUN: 15 mg/dL (ref 6–23)
CALCIUM: 8.3 mg/dL — AB (ref 8.4–10.5)
CO2: 22 mEq/L (ref 19–32)
CREATININE: 0.51 mg/dL (ref 0.50–1.10)
Chloride: 103 mEq/L (ref 96–112)
GFR calc Af Amer: >90 mL/min (ref >90)
GFR calc non Af Amer: >90 mL/min (ref >90)
GLUCOSE: 82 mg/dL (ref 70–99)
Potassium: 4.2 mEq/L (ref 3.7–5.3)
SODIUM: 137 meq/L (ref 137–147)
Total Bilirubin: 0.2 mg/dL — ABNORMAL LOW (ref 0.3–1.2)
Total Protein: 5.7 g/dL — ABNORMAL LOW (ref 6.0–8.3)

## 2014-06-24 LAB — CBC WITH DIFFERENTIAL/PLATELET
Basophils Absolute: 0 10*3/uL (ref 0.0–0.1)
Basophils Relative: 0 % (ref 0–1)
EOS PCT: 1 % (ref 0–5)
Eosinophils Absolute: 0.1 10*3/uL (ref 0.0–0.7)
HCT: 28.3 % — ABNORMAL LOW (ref 36.0–46.0)
Hemoglobin: 9 g/dL — ABNORMAL LOW (ref 12.0–15.0)
LYMPHS ABS: 2.1 10*3/uL (ref 0.7–4.0)
Lymphocytes Relative: 20 % (ref 12–46)
MCH: 27.6 pg (ref 26.0–34.0)
MCHC: 31.8 g/dL (ref 30.0–36.0)
MCV: 86.8 fL (ref 78.0–100.0)
MONO ABS: 0.7 10*3/uL (ref 0.1–1.0)
Monocytes Relative: 7 % (ref 3–12)
Neutro Abs: 7.5 10*3/uL (ref 1.7–7.7)
Neutrophils Relative %: 72 % (ref 43–77)
PLATELETS: 376 10*3/uL (ref 150–400)
RBC: 3.26 MIL/uL — AB (ref 3.87–5.11)
RDW: 17.8 % — ABNORMAL HIGH (ref 11.5–15.5)
WBC: 10.4 10*3/uL (ref 4.0–10.5)

## 2014-06-24 LAB — APTT: APTT: 27 s (ref 24–37)

## 2014-06-24 LAB — PROTIME-INR
INR: 1.04 (ref 0.00–1.49)
PROTHROMBIN TIME: 13.6 s (ref 11.6–15.2)

## 2014-06-24 SURGERY — OPEN REDUCTION INTERNAL FIXATION (ORIF) ANKLE FRACTURE
Anesthesia: Regional | Site: Leg Lower | Laterality: Right

## 2014-06-24 MED ORDER — GLYCOPYRROLATE 0.2 MG/ML IJ SOLN
INTRAMUSCULAR | Status: DC | PRN
  Administered 2014-06-24: 0.6 mg via INTRAVENOUS

## 2014-06-24 MED ORDER — HYDROMORPHONE HCL PF 1 MG/ML IJ SOLN
INTRAMUSCULAR | Status: AC
  Filled 2014-06-24: qty 1

## 2014-06-24 MED ORDER — HYDROMORPHONE HCL PF 1 MG/ML IJ SOLN
0.2500 mg | INTRAMUSCULAR | Status: DC | PRN
  Administered 2014-06-24 (×4): 0.5 mg via INTRAVENOUS

## 2014-06-24 MED ORDER — LACTATED RINGERS IV SOLN
INTRAVENOUS | Status: DC
  Administered 2014-06-24 (×3): via INTRAVENOUS

## 2014-06-24 MED ORDER — POTASSIUM CHLORIDE IN NACL 20-0.9 MEQ/L-% IV SOLN
INTRAVENOUS | Status: DC
Start: 2014-06-24 — End: 2014-06-25
  Administered 2014-06-24: 22:00:00 via INTRAVENOUS
  Filled 2014-06-24 (×3): qty 1000

## 2014-06-24 MED ORDER — PROPOFOL 10 MG/ML IV BOLUS
INTRAVENOUS | Status: DC | PRN
  Administered 2014-06-24: 150 mg via INTRAVENOUS

## 2014-06-24 MED ORDER — PROPOFOL 10 MG/ML IV BOLUS
INTRAVENOUS | Status: AC
  Filled 2014-06-24: qty 20

## 2014-06-24 MED ORDER — CEFAZOLIN SODIUM 1-5 GM-% IV SOLN
1.0000 g | Freq: Four times a day (QID) | INTRAVENOUS | Status: AC
  Administered 2014-06-24 – 2014-06-25 (×3): 1 g via INTRAVENOUS
  Filled 2014-06-24 (×4): qty 50

## 2014-06-24 MED ORDER — CEFAZOLIN SODIUM-DEXTROSE 2-3 GM-% IV SOLR: 2.0000 g | INTRAVENOUS | Status: DC

## 2014-06-24 MED ORDER — ONDANSETRON HCL 4 MG/2ML IJ SOLN
4.0000 mg | Freq: Four times a day (QID) | INTRAMUSCULAR | Status: DC | PRN
Start: 2014-06-24 — End: 2014-06-25

## 2014-06-24 MED ORDER — METOCLOPRAMIDE HCL 10 MG PO TABS
5.0000 mg | ORAL_TABLET | Freq: Three times a day (TID) | ORAL | Status: DC | PRN
Start: 2014-06-24 — End: 2014-06-25

## 2014-06-24 MED ORDER — OXYCODONE HCL 5 MG PO TABS
5.0000 mg | ORAL_TABLET | ORAL | Status: DC | PRN
  Administered 2014-06-24 – 2014-06-25 (×5): 10 mg via ORAL
  Filled 2014-06-24 (×5): qty 2

## 2014-06-24 MED ORDER — DIVALPROEX SODIUM ER 500 MG PO TB24
1500.0000 mg | ORAL_TABLET | Freq: Every day | ORAL | Status: DC
  Administered 2014-06-24: 1500 mg via ORAL
  Filled 2014-06-24 (×3): qty 3

## 2014-06-24 MED ORDER — NEOSTIGMINE METHYLSULFATE 10 MG/10ML IV SOLN
INTRAVENOUS | Status: DC | PRN
  Administered 2014-06-24: 4 mg via INTRAVENOUS

## 2014-06-24 MED ORDER — ONDANSETRON HCL 4 MG/2ML IJ SOLN
INTRAMUSCULAR | Status: DC | PRN
  Administered 2014-06-24: 4 mg via INTRAVENOUS

## 2014-06-24 MED ORDER — MIDAZOLAM HCL 2 MG/2ML IJ SOLN
INTRAMUSCULAR | Status: AC
  Filled 2014-06-24: qty 2

## 2014-06-24 MED ORDER — MORPHINE SULFATE 2 MG/ML IJ SOLN
1.0000 mg | INTRAMUSCULAR | Status: DC | PRN
  Administered 2014-06-24 – 2014-06-25 (×3): 1 mg via INTRAVENOUS
  Filled 2014-06-24 (×3): qty 1

## 2014-06-24 MED ORDER — PRENATAL MULTIVITAMIN CH
1.0000 | ORAL_TABLET | Freq: Every day | ORAL | Status: DC
  Administered 2014-06-24: 1 via ORAL
  Filled 2014-06-24 (×3): qty 1

## 2014-06-24 MED ORDER — ACETAMINOPHEN 160 MG/5ML PO SOLN
325.0000 mg | ORAL | Status: DC | PRN
  Filled 2014-06-24: qty 20.3

## 2014-06-24 MED ORDER — FOLIC ACID 1 MG PO TABS
1.0000 mg | ORAL_TABLET | Freq: Every day | ORAL | Status: DC
  Administered 2014-06-24: 1 mg via ORAL
  Filled 2014-06-24 (×3): qty 1

## 2014-06-24 MED ORDER — ENOXAPARIN SODIUM 40 MG/0.4ML ~~LOC~~ SOLN
40.0000 mg | SUBCUTANEOUS | Status: DC
  Administered 2014-06-25: 40 mg via SUBCUTANEOUS
  Filled 2014-06-24 (×2): qty 0.4

## 2014-06-24 MED ORDER — 0.9 % SODIUM CHLORIDE (POUR BTL) OPTIME
TOPICAL | Status: DC | PRN
  Administered 2014-06-24 (×2): 1000 mL

## 2014-06-24 MED ORDER — FENTANYL CITRATE 0.05 MG/ML IJ SOLN
INTRAMUSCULAR | Status: DC | PRN
  Administered 2014-06-24: 50 ug via INTRAVENOUS
  Administered 2014-06-24: 100 ug via INTRAVENOUS
  Administered 2014-06-24 (×5): 50 ug via INTRAVENOUS
  Administered 2014-06-24: 100 ug via INTRAVENOUS
  Administered 2014-06-24: 50 ug via INTRAVENOUS

## 2014-06-24 MED ORDER — MIDAZOLAM HCL 5 MG/5ML IJ SOLN
INTRAMUSCULAR | Status: DC | PRN
  Administered 2014-06-24: 2 mg via INTRAVENOUS

## 2014-06-24 MED ORDER — FENTANYL CITRATE 0.05 MG/ML IJ SOLN
INTRAMUSCULAR | Status: AC
  Filled 2014-06-24: qty 5

## 2014-06-24 MED ORDER — CEFAZOLIN SODIUM-DEXTROSE 2-3 GM-% IV SOLR
INTRAVENOUS | Status: AC
  Administered 2014-06-24: 2 g via INTRAVENOUS
  Filled 2014-06-24: qty 50

## 2014-06-24 MED ORDER — BUPIVACAINE HCL (PF) 0.25 % IJ SOLN
INTRAMUSCULAR | Status: AC
Start: 2014-06-24 — End: 2014-06-24
  Filled 2014-06-24: qty 30

## 2014-06-24 MED ORDER — ONDANSETRON HCL 4 MG PO TABS: 4.0000 mg | ORAL_TABLET | Freq: Four times a day (QID) | ORAL | Status: DC | PRN

## 2014-06-24 MED ORDER — OXYCODONE HCL 5 MG/5ML PO SOLN: 5.0000 mg | Freq: Once | ORAL | Status: DC | PRN

## 2014-06-24 MED ORDER — ROCURONIUM BROMIDE 100 MG/10ML IV SOLN
INTRAVENOUS | Status: DC | PRN
  Administered 2014-06-24: 50 mg via INTRAVENOUS
  Administered 2014-06-24 (×2): 10 mg via INTRAVENOUS

## 2014-06-24 MED ORDER — CEFAZOLIN SODIUM-DEXTROSE 2-3 GM-% IV SOLR: INTRAVENOUS | Status: DC | PRN

## 2014-06-24 MED ORDER — LIDOCAINE HCL 2 % EX GEL
CUTANEOUS | Status: AC
  Filled 2014-06-24: qty 20

## 2014-06-24 MED ORDER — ROPIVACAINE HCL 5 MG/ML IJ SOLN
INTRAMUSCULAR | Status: DC | PRN
  Administered 2014-06-24: 15 mL via PERINEURAL
  Administered 2014-06-24: 30 mL via PERINEURAL

## 2014-06-24 MED ORDER — OXYCODONE HCL 5 MG PO TABS: 5.0000 mg | ORAL_TABLET | Freq: Once | ORAL | Status: DC | PRN

## 2014-06-24 MED ORDER — HYDROCODONE-ACETAMINOPHEN 5-325 MG PO TABS
1.0000 | ORAL_TABLET | Freq: Four times a day (QID) | ORAL | Status: DC | PRN
  Administered 2014-06-25: 2 via ORAL
  Filled 2014-06-24: qty 2

## 2014-06-24 MED ORDER — METOCLOPRAMIDE HCL 5 MG/ML IJ SOLN: 5.0000 mg | Freq: Three times a day (TID) | INTRAMUSCULAR | Status: DC | PRN

## 2014-06-24 MED ORDER — HYDROMORPHONE HCL PF 1 MG/ML IJ SOLN
INTRAMUSCULAR | Status: AC
  Administered 2014-06-24: 0.5 mg via INTRAVENOUS
  Filled 2014-06-24: qty 1

## 2014-06-24 MED ORDER — ACETAMINOPHEN 325 MG PO TABS: 325.0000 mg | ORAL_TABLET | ORAL | Status: DC | PRN

## 2014-06-24 MED ORDER — LIDOCAINE HCL (CARDIAC) 20 MG/ML IV SOLN
INTRAVENOUS | Status: DC | PRN
  Administered 2014-06-24: 20 mg via INTRAVENOUS
  Administered 2014-06-24: 60 mg via INTRAVENOUS

## 2014-06-24 SURGICAL SUPPLY — 89 items
BANDAGE ELASTIC 4 VELCRO ST LF (GAUZE/BANDAGES/DRESSINGS) IMPLANT
BANDAGE ELASTIC 6 VELCRO ST LF (GAUZE/BANDAGES/DRESSINGS) IMPLANT
BANDAGE ESMARK 6X9 LF (GAUZE/BANDAGES/DRESSINGS) ×2 IMPLANT
BIT DRILL 2.5X2.75 QC CALB (BIT) ×2 IMPLANT
BIT DRILL CALIBRATED 2.7 (BIT) ×1 IMPLANT
BIT DRILL CALIBRATED 2.7MM (BIT) ×1
BLADE SURG 10 STRL SS (BLADE) ×4 IMPLANT
BNDG CMPR 9X6 STRL LF SNTH (GAUZE/BANDAGES/DRESSINGS) ×2
BNDG COHESIVE 4X5 TAN STRL (GAUZE/BANDAGES/DRESSINGS) ×2 IMPLANT
BNDG ESMARK 6X9 LF (GAUZE/BANDAGES/DRESSINGS) ×4
BNDG GAUZE ELAST 4 BULKY (GAUZE/BANDAGES/DRESSINGS) ×6 IMPLANT
BONE CANC CHIPS 40CC CAN1/2 (Bone Implant) ×4 IMPLANT
BRUSH SCRUB DISP (MISCELLANEOUS) ×6 IMPLANT
CHIPS CANC BONE 40CC CAN1/2 (Bone Implant) ×2 IMPLANT
COVER SURGICAL LIGHT HANDLE (MISCELLANEOUS) ×6 IMPLANT
DRAPE C-ARM 42X72 X-RAY (DRAPES) ×2 IMPLANT
DRAPE C-ARMOR (DRAPES) ×6 IMPLANT
DRAPE ORTHO SPLIT 77X108 STRL (DRAPES) ×8
DRAPE PROXIMA HALF (DRAPES) ×4 IMPLANT
DRAPE SURG ORHT 6 SPLT 77X108 (DRAPES) ×6 IMPLANT
DRAPE U-SHAPE 47X51 STRL (DRAPES) ×4 IMPLANT
DRSG ADAPTIC 3X8 NADH LF (GAUZE/BANDAGES/DRESSINGS) ×2 IMPLANT
DRSG EMULSION OIL 3X3 NADH (GAUZE/BANDAGES/DRESSINGS) IMPLANT
DRSG PAD ABDOMINAL 8X10 ST (GAUZE/BANDAGES/DRESSINGS) ×2 IMPLANT
ELECT REM PT RETURN 9FT ADLT (ELECTROSURGICAL) ×4
ELECTRODE REM PT RTRN 9FT ADLT (ELECTROSURGICAL) ×2 IMPLANT
GAUZE SPONGE 4X4 12PLY STRL (GAUZE/BANDAGES/DRESSINGS) IMPLANT
GLOVE BIO SURGEON STRL SZ7.5 (GLOVE) ×4 IMPLANT
GLOVE BIO SURGEON STRL SZ8 (GLOVE) ×6 IMPLANT
GLOVE BIOGEL PI IND STRL 7.5 (GLOVE) ×2 IMPLANT
GLOVE BIOGEL PI IND STRL 8 (GLOVE) ×2 IMPLANT
GLOVE BIOGEL PI INDICATOR 7.5 (GLOVE) ×2
GLOVE BIOGEL PI INDICATOR 8 (GLOVE) ×2
GOWN STRL REUS W/ TWL LRG LVL3 (GOWN DISPOSABLE) ×4 IMPLANT
GOWN STRL REUS W/ TWL XL LVL3 (GOWN DISPOSABLE) ×2 IMPLANT
GOWN STRL REUS W/TWL LRG LVL3 (GOWN DISPOSABLE) ×8
GOWN STRL REUS W/TWL XL LVL3 (GOWN DISPOSABLE) ×4
GRAFT BNE CHIP CANC 1-8 40 (Bone Implant) IMPLANT
K-WIRE ACE 1.6X6 (WIRE) ×20
KIT BASIN OR (CUSTOM PROCEDURE TRAY) ×4 IMPLANT
KIT ROOM TURNOVER OR (KITS) ×4 IMPLANT
KWIRE ACE 1.6X6 (WIRE) IMPLANT
MANIFOLD NEPTUNE II (INSTRUMENTS) ×2 IMPLANT
NDL HYPO 21X1.5 SAFETY (NEEDLE) IMPLANT
NEEDLE HYPO 21X1.5 SAFETY (NEEDLE) IMPLANT
NS IRRIG 1000ML POUR BTL (IV SOLUTION) ×4 IMPLANT
PACK GENERAL/GYN (CUSTOM PROCEDURE TRAY) ×2 IMPLANT
PAD ARMBOARD 7.5X6 YLW CONV (MISCELLANEOUS) ×8 IMPLANT
PAD CAST 4YDX4 CTTN HI CHSV (CAST SUPPLIES) IMPLANT
PADDING CAST ABS 4INX4YD NS (CAST SUPPLIES) ×2
PADDING CAST ABS COTTON 4X4 ST (CAST SUPPLIES) IMPLANT
PADDING CAST COTTON 4X4 STRL (CAST SUPPLIES)
PADDING CAST COTTON 6X4 STRL (CAST SUPPLIES) ×2 IMPLANT
PENCIL BUTTON HOLSTER BLD 10FT (ELECTRODE) ×4 IMPLANT
PLATE 9H LT DIST ANTLAT TIB (Plate) ×4 IMPLANT
PLATE ANTLAT CNTR W 157X9 (Plate) IMPLANT
SCOTCHCAST PLUS 3X4 WHITE (CAST SUPPLIES) ×4 IMPLANT
SCOTCHCAST PLUS 4X4 WHITE (CAST SUPPLIES) ×2 IMPLANT
SCREW CORT 3.5X30 815037030 (Screw) ×4 IMPLANT
SCREW CORT 3.5X32 815037032 (Screw) ×2 IMPLANT
SCREW CORTICAL 3.5MM  28MM (Screw) ×4 IMPLANT
SCREW CORTICAL 3.5MM 22MM (Screw) ×2 IMPLANT
SCREW CORTICAL 3.5MM 28MM (Screw) IMPLANT
SCREW LOCK CORT STAR 3.5X34 (Screw) ×2 IMPLANT
SCREW LOCK CORT STAR 3.5X40 (Screw) ×2 IMPLANT
SCREW LOCK CORT STAR 3.5X42 (Screw) ×2 IMPLANT
SCREW LOCK CORT STAR 3.5X44 (Screw) ×6 IMPLANT
SCREW LOCK CORT STAR 3.5X46 (Screw) ×2 IMPLANT
SPLINT PLASTER CAST XFAST 5X30 (CAST SUPPLIES) IMPLANT
SPLINT PLASTER XFAST SET 5X30 (CAST SUPPLIES) ×2
SPONGE GAUZE 4X4 12PLY STER LF (GAUZE/BANDAGES/DRESSINGS) ×2 IMPLANT
SPONGE LAP 18X18 X RAY DECT (DISPOSABLE) ×6 IMPLANT
SPONGE SCRUB IODOPHOR (GAUZE/BANDAGES/DRESSINGS) ×4 IMPLANT
STAPLER VISISTAT 35W (STAPLE) IMPLANT
STOCKINETTE TUBULAR COTT 4X25 (GAUZE/BANDAGES/DRESSINGS) ×2 IMPLANT
SUCTION FRAZIER TIP 10 FR DISP (SUCTIONS) ×4 IMPLANT
SUT ETHILON 2 0 FS 18 (SUTURE) ×6 IMPLANT
SUT ETHILON 3 0 PS 1 (SUTURE) ×10 IMPLANT
SUT PDS AB 2-0 CT1 27 (SUTURE) IMPLANT
SUT VIC AB 2-0 CT1 27 (SUTURE) ×8
SUT VIC AB 2-0 CT1 TAPERPNT 27 (SUTURE) ×4 IMPLANT
SUT VIC AB 2-0 CT3 27 (SUTURE) IMPLANT
SYR CONTROL 10ML LL (SYRINGE) IMPLANT
TOWEL OR 17X24 6PK STRL BLUE (TOWEL DISPOSABLE) ×4 IMPLANT
TOWEL OR 17X26 10 PK STRL BLUE (TOWEL DISPOSABLE) ×6 IMPLANT
TUBE CONNECTING 12'X1/4 (SUCTIONS) ×1
TUBE CONNECTING 12X1/4 (SUCTIONS) ×3 IMPLANT
UNDERPAD 30X30 INCONTINENT (UNDERPADS AND DIAPERS) ×4 IMPLANT
WATER STERILE IRR 1000ML POUR (IV SOLUTION) ×2 IMPLANT

## 2014-06-24 NOTE — Transfer of Care (Signed)
Immediate Anesthesia Transfer of Care Note  Patient: Isabella Jenkins  Procedure(s) Performed: Procedure(s): OPEN REDUCTION INTERNAL FIXATION (ORIF) TIBIA PILON  FRACTURE WITH REMOVAL OF EXTERNAL FIXATURE  (Left) CAST APPLICATION (Right)  Patient Location: PACU  Anesthesia Type:General and GA combined with regional for post-op pain  Level of Consciousness: awake, alert , oriented, patient cooperative and responds to stimulation  Airway & Oxygen Therapy: Patient Spontanous Breathing and Patient connected to nasal cannula oxygen  Post-op Assessment: Report given to PACU RN, Post -op Vital signs reviewed and stable and Patient moving all extremities X 4  Post vital signs: Reviewed and stable  Complications: No apparent anesthesia complications

## 2014-06-24 NOTE — H&P (Signed)
History and Physical Interval Note:  Swelling improved Left ankle  Wrinkles with gentle compression  Distal motor and sensory functions intact  Ext warm  + DP pulse  Ex fix stable  Pt approx [redacted] weeks pregnant  No additional changes in exam  Isabella Jenkins has presented today for surgery, with the diagnosis of Left open pilon fracture, right talus fracture dislocation The various methods of treatment have been discussed with the patient and family. After consideration of risks, benefits and other options for treatment, the patient has consented to Procedure(s):  OPEN REDUCTION INTERNAL FIXATION (ORIF) TIBIA PILON FRACTURE WITH REMOVAL OF EXTERNAL FIXATURE (Left) as a surgical intervention . The patient's history has been reviewed, patient examined, no change in status, stable for surgery. I have reviewed the patient's chart and labs. Questions were answered to the patient's satisfaction.  Jari Pigg, PA-C  [facility]  [phone] (P)  06/23/2014  5:51 PM     I have seen and examined the patient. I agree with the findings above.  I discussed with the patient the risks and benefits of surgery for her left pilon, including the possibility of infection, nerve injury, vessel injury, wound breakdown, arthritis, symptomatic hardware, DVT/ PE, loss of motion, and need for further surgery among others.  We also specifically discussed the need to stage surgery because of the elevated risk of soft tissue breakdown that could lead to amputation.  She understood these risks and wished to proceed.  Rozanna Box, MD 06/24/2014 7:51 AM

## 2014-06-24 NOTE — Progress Notes (Signed)
Fetal heart sounds present at 139-140 per Mcneil Sober RN OB rapid response from [facility].

## 2014-06-24 NOTE — Progress Notes (Signed)
Dr. Moser at bedside.

## 2014-06-24 NOTE — Brief Op Note (Signed)
06/24/2014   12:38 PM   PATIENT:  Isabella Jenkins  38 y.o. female  PRE-OPERATIVE DIAGNOSIS:   1. Left open pilon fracture 2. Retained external fixator 3. Pin tract ulcerations 4. S/p reduction of right talus fracture subtalar dislocation  POST-OPERATIVE DIAGNOSIS:   1. Left open pilon fracture 2. Retained external fixator 3. Pin tract ulcerations 4. S/p reduction of right talus fracture subtalar dislocation  PROCEDURE:  Procedure(s): 1. OPEN REDUCTION INTERNAL FIXATION (ORIF) TIBIA PILON  FRACTURE 2. REMOVAL OF EXTERNAL FIXATURE  (Left) 3. CURETTAGE OF PIN TRACT ULCERATIONS 4. CAST APPLICATION (Right)   SURGEON:  Surgeon(s) and Role:    * Rozanna Box, MD - Primary  PHYSICIAN ASSISTANT: Ainsley Spinner, PA-C  ANESTHESIA:   general  I/O:  Total I/O In: 2000 [I.V.:2000] Out: 50 [Blood:50]  SPECIMEN:  No Specimen  TOURNIQUET:   Total Tourniquet Time Documented: Thigh (Left) - 107 minutes Total: Thigh (Left) - 107 minutes   DICTATION: .Other Dictation: Dictation Number [id number]

## 2014-06-24 NOTE — Progress Notes (Signed)
Orthopedic Tech Progress Note Patient Details:  Isabella Jenkins 03-05-1976 [id number]  Patient ID: Isabella Jenkins, female   DOB: [birth year], 38 y.o.   MRN: [id number]   Irish Elders 06/24/2014, 3:36 PMTrapeze bar
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
## 2014-06-24 NOTE — Anesthesia Procedure Notes (Addendum)
Anesthesia Regional Block:  Popliteal block  Pre-Anesthetic Checklist: ,, timeout performed, Correct Patient, Correct Site, Correct Laterality, Correct Procedure, Correct Position, site marked, Risks and benefits discussed,  Surgical consent,  Pre-op evaluation,  At surgeon's request and post-op pain management  Laterality: Lower and Left  Prep: chloraprep       Needles:  Injection technique: Single-shot  Needle Type: Echogenic Stimulator Needle          Additional Needles:  Procedures: ultrasound guided (picture in chart) Popliteal block  Nerve Stimulator or Paresthesia:  Response: plantarflexion, 0.5 mA,   Additional Responses:   Narrative:  Start time: 06/24/2014 8:03 AM End time: 06/24/2014 8:15 AM Injection made incrementally with aspirations every 5 mL.  Performed by: Personally  Anesthesiologist: Domanick Cuccia  Additional Notes: H+P and labs reviewed, risks and benefits discussed with patient, procedure tolerated well without complications   Anesthesia Regional Block:  Adductor canal block  Pre-Anesthetic Checklist: ,, timeout performed, Correct Patient, Correct Site, Correct Laterality, Correct Procedure, Correct Position, site marked, Risks and benefits discussed,  Surgical consent,  Pre-op evaluation,  At surgeon's request and post-op pain management  Laterality: Lower and Left  Prep: chloraprep       Needles:  Injection technique: Single-shot  Needle Type: Echogenic Needle          Additional Needles:  Procedures: ultrasound guided (picture in chart) Adductor canal block Narrative:  Start time: 06/24/2014 1:39 PM End time: 06/24/2014 1:46 PM Injection made incrementally with aspirations every 5 mL.  Performed by: Personally  Anesthesiologist: Trindon Dorton  Additional Notes: H+P and labs reviewed, risks and benefits discussed with patient, procedure tolerated well without complications

## 2014-06-24 NOTE — Progress Notes (Signed)
Adductor canal block in place per Dr Ermalene Postin

## 2014-06-24 NOTE — OR Nursing (Signed)
Notified Mary Early at [facility] OB Rapid Response for fetal heart tone assessment.  Stanton Kidney was contacting the OB to confirm order.  Stanton Kidney will be over to assess fetal HR.

## 2014-06-24 NOTE — Progress Notes (Signed)
FHR- auscultated with doppler- 132bpm

## 2014-06-24 NOTE — Progress Notes (Signed)
FHR-140BPM

## 2014-06-24 NOTE — Anesthesia Preprocedure Evaluation (Addendum)
Anesthesia Evaluation  Patient identified by MRN, date of birth, ID band Patient awake    Reviewed: Allergy & Precautions, H&P , NPO status , Patient's Chart, lab work & pertinent test results  Airway Mallampati: II TM Distance: >3 FB Neck ROM: Full    Dental  (+) Teeth Intact,    Pulmonary neg shortness of breath, neg COPDneg recent URI, former smoker,  breath sounds clear to auscultation        Cardiovascular hypertension, negative cardio ROS  Rhythm:Regular     Neuro/Psych Seizures -, Well Controlled,  PSYCHIATRIC DISORDERS Anxiety Depression    GI/Hepatic negative GI ROS, Neg liver ROS,   Endo/Other  negative endocrine ROS  Renal/GU negative Renal ROS     Musculoskeletal Left ankle fracture   Abdominal   Peds  Hematology  (+) anemia ,   Anesthesia Other Findings ~[redacted] weeks pregnant, will doppler HR pre and post, discussed risks of surgery and anesthesia in setting of pregnancy   Reproductive/Obstetrics (+) Pregnancy                          Anesthesia Physical Anesthesia Plan  ASA: II  Anesthesia Plan: General and Regional   Post-op Pain Management:    Induction: Intravenous  Airway Management Planned: LMA and Oral ETT  Additional Equipment: None  Intra-op Plan:   Post-operative Plan: Extubation in OR  Informed Consent: I have reviewed the patients History and Physical, chart, labs and discussed the procedure including the risks, benefits and alternatives for the proposed anesthesia with the patient or authorized representative who has indicated his/her understanding and acceptance.   Dental advisory given  Plan Discussed with: CRNA and Surgeon  Anesthesia Plan Comments:         Anesthesia Quick Evaluation

## 2014-06-25 ENCOUNTER — Encounter (HOSPITAL_COMMUNITY): Payer: Self-pay | Admitting: Orthopedic Surgery

## 2014-06-25 DIAGNOSIS — O99891 Other specified diseases and conditions complicating pregnancy: Secondary | ICD-10-CM | POA: Diagnosis not present

## 2014-06-25 MED ORDER — MORPHINE SULFATE 2 MG/ML IJ SOLN
2.0000 mg | INTRAMUSCULAR | Status: DC | PRN
  Administered 2014-06-25 (×2): 2 mg via INTRAVENOUS
  Filled 2014-06-25 (×2): qty 1

## 2014-06-25 MED ORDER — HYDROCODONE-ACETAMINOPHEN 10-325 MG PO TABS: 1.0000 | ORAL_TABLET | Freq: Four times a day (QID) | ORAL | Status: DC | PRN

## 2014-06-25 MED ORDER — DIAZEPAM 5 MG PO TABS: 2.5000 mg | ORAL_TABLET | Freq: Four times a day (QID) | ORAL | Status: DC | PRN

## 2014-06-25 MED ORDER — DIAZEPAM 5 MG PO TABS
2.5000 mg | ORAL_TABLET | Freq: Four times a day (QID) | ORAL | Status: DC | PRN
  Administered 2014-06-25: 2.5 mg via ORAL
  Filled 2014-06-25: qty 1

## 2014-06-25 MED ORDER — ENOXAPARIN SODIUM 40 MG/0.4ML ~~LOC~~ SOLN: 40.0000 mg | SUBCUTANEOUS | Status: DC

## 2014-06-25 MED ORDER — TIZANIDINE HCL 2 MG PO TABS: 2.0000 mg | ORAL_TABLET | Freq: Three times a day (TID) | ORAL | Status: DC | PRN

## 2014-06-25 MED ORDER — DIAZEPAM 5 MG PO TABS
2.5000 mg | ORAL_TABLET | Freq: Three times a day (TID) | ORAL | Status: DC | PRN
  Administered 2014-06-25: 2.5 mg via ORAL
  Filled 2014-06-25: qty 1

## 2014-06-25 MED ORDER — DIAZEPAM 5 MG PO TABS: 5.0000 mg | ORAL_TABLET | Freq: Three times a day (TID) | ORAL | Status: DC | PRN

## 2014-06-25 MED ORDER — HYDROCODONE-ACETAMINOPHEN 10-325 MG PO TABS
1.0000 | ORAL_TABLET | Freq: Four times a day (QID) | ORAL | Status: DC | PRN
  Administered 2014-06-25: 2 via ORAL
  Filled 2014-06-25: qty 2

## 2014-06-25 MED ORDER — OXYCODONE HCL 5 MG PO TABS: 5.0000 mg | ORAL_TABLET | ORAL | Status: DC | PRN

## 2014-06-25 MED ORDER — DIAZEPAM 5 MG PO TABS: 5.0000 mg | ORAL_TABLET | Freq: Four times a day (QID) | ORAL | Status: DC | PRN

## 2014-06-25 NOTE — Progress Notes (Signed)
Patient requested change in pain medication due to uncontrolled pain.  On call, Doran Stabler PA contacted.  Orders received to increase Morphine dose to 2mg  IV Q2 prn for severe pain.  Will continue to monitor patient.
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
## 2014-06-25 NOTE — Progress Notes (Signed)
[facility] RN consulted to ensure no additional fetal monitoring was needed due to the amount of pain medication the patient is taking.  Informed to proceed with pain management regimen, monitoring not necessary at this time.  OB team aware of patient from previous visit and surgery case.  Will continue to monitor patient and pain level.

## 2014-06-25 NOTE — Discharge Instructions (Signed)
[facility] Discharge Instructions   General Discharge Instructions  WEIGHT BEARING STATUS: Nonweightbearing left leg, weight-bear as tolerated right leg for transfers only wearing cast shoe  RANGE OF MOTION/ACTIVITY: As tolerated within weight bearing restrictions  Wound care: Do not remove splint or cast until followup  Diet: as you were eating previously.  Can use over the counter stool softeners and bowel preparations, such as Miralax, to help with bowel movements.  Narcotics can be constipating.  Be sure to drink plenty of fluids  STOP SMOKING OR USING NICOTINE PRODUCTS!!!!  As discussed nicotine severely impairs your body's ability to heal surgical and traumatic wounds but also impairs bone healing.  Wounds and bone heal by forming microscopic blood vessels (angiogenesis) and nicotine is a vasoconstrictor (essentially, shrinks blood vessels).  Therefore, if vasoconstriction occurs to these microscopic blood vessels they essentially disappear and are unable to deliver necessary nutrients to the healing tissue.  This is one modifiable factor that you can do to dramatically increase your chances of healing your injury.    (This means no smoking, no nicotine gum, patches, etc)  DO NOT USE NONSTEROIDAL ANTI-INFLAMMATORY DRUGS (NSAID'S)  Using products such as Advil (ibuprofen), Aleve (naproxen), Motrin (ibuprofen) for additional pain control during fracture healing can delay and/or prevent the healing response.  If you would like to take over the counter (OTC) medication, Tylenol (acetaminophen) is ok.  However, some narcotic medications that are given for pain control contain acetaminophen as well. Therefore, you should not exceed more than 4000 mg of tylenol in a day if you do not have liver disease.  Also note that there are may OTC medicines, such as cold medicines and allergy medicines that my contain tylenol as well.  If you have any questions about medications and/or  interactions please ask your doctor/PA or your pharmacist.   PAIN MEDICATION USE AND EXPECTATIONS  You have likely been given narcotic medications to help control your pain.  After a traumatic event that results in an fracture (broken bone) with or without surgery, it is ok to use narcotic pain medications to help control one's pain.  We understand that everyone responds to pain differently and each individual patient will be evaluated on a regular basis for the continued need for narcotic medications. Ideally, narcotic medication use should last no more than 6-8 weeks (coinciding with fracture healing).   As a patient it is your responsibility as well to monitor narcotic medication use and report the amount and frequency you use these medications when you come to your office visit.   We would also advise that if you are using narcotic medications, you should take a dose prior to therapy to maximize you participation.  IF YOU ARE ON NARCOTIC MEDICATIONS IT IS NOT PERMISSIBLE TO OPERATE A MOTOR VEHICLE (MOTORCYCLE/CAR/TRUCK/MOPED) OR HEAVY MACHINERY DO NOT MIX NARCOTICS WITH OTHER CNS (CENTRAL NERVOUS SYSTEM) DEPRESSANTS SUCH AS ALCOHOL       ICE AND ELEVATE INJURED/OPERATIVE EXTREMITY  Using ice and elevating the injured extremity above your heart can help with swelling and pain control.  Icing in a pulsatile fashion, such as 20 minutes on and 20 minutes off, can be followed.    Do not place ice directly on skin. Make sure there is a barrier between to skin and the ice pack.    Using frozen items such as frozen peas works well as the conform nicely to the are that needs to be iced.  USE AN ACE WRAP OR TED HOSE  FOR SWELLING CONTROL  In addition to icing and elevation, Ace wraps or TED hose are used to help limit and resolve swelling.  It is recommended to use Ace wraps or TED hose until you are informed to stop.    When using Ace Wraps start the wrapping distally (farthest away from the body) and  wrap proximally (closer to the body)   Example: If you had surgery on your leg or thing and you do not have a splint on, start the ace wrap at the toes and work your way up to the thigh        If you had surgery on your upper extremity and do not have a splint on, start the ace wrap at your fingers and work your way up to the upper arm  IF YOU ARE IN A SPLINT OR CAST DO NOT [place]   If your splint gets wet for any reason please contact the office immediately. You may shower in your splint or cast as long as you keep it dry.  This can be done by wrapping in a cast cover or garbage back (or similar)  Do Not stick any thing down your splint or cast such as pencils, money, or hangers to try and scratch yourself with.  If you feel itchy take benadryl as prescribed on the bottle for itching  IF YOU ARE IN A CAM BOOT (BLACK BOOT)  You may remove boot periodically. Perform daily dressing changes as noted below.  Wash the liner of the boot regularly and wear a sock when wearing the boot. It is recommended that you sleep in the boot until told otherwise  CALL THE OFFICE WITH ANY QUESTIONS OR CONCERTS: [phone]     Discharge Pin Site Instructions  Dress pins daily with Kerlix roll starting on POD 2. Wrap the Kerlix so that it tamps the skin down around the pin-skin interface to prevent/limit motion of the skin relative to the pin.  (Pin-skin motion is the primary cause of pain and infection related to external fixator pin sites).  Remove any crust or coagulum that may obstruct drainage with a saline moistened gauze or soap and water.  After POD 3, if there is no discernable drainage on the pin site dressing, the interval for change can by increased to every other day.  You may shower with the fixator, cleaning all pin sites gently with soap and water.  If you have a surgical wound this needs to be completely dry and without drainage before showering.  The extremity can be lifted  by the fixator to facilitate wound care and transfers.  Notify the office/Doctor if you experience increasing drainage, redness, or pain from a pin site, or if you notice purulent (thick, snot-like) drainage.  Discharge Wound Care Instructions  Do NOT apply any ointments, solutions or lotions to pin sites or surgical wounds.  These prevent needed drainage and even though solutions like hydrogen peroxide kill bacteria, they also damage cells lining the pin sites that help fight infection.  Applying lotions or ointments can keep the wounds moist and can cause them to breakdown and open up as well. This can increase the risk for infection. When in doubt call the office.  Surgical incisions should be dressed daily.  If any drainage is noted, use one layer of adaptic, then gauze, Kerlix, and an ace wrap.  Once the incision is completely dry and without drainage, it may be left open to air out.  Showering may begin 36-48 hours later.  Cleaning gently with soap and water.  Traumatic wounds should be dressed daily as well.    One layer of adaptic, gauze, Kerlix, then ace wrap.  The adaptic can be discontinued once the draining has ceased    If you have a wet to dry dressing: wet the gauze with saline the squeeze as much saline out so the gauze is moist (not soaking wet), place moistened gauze over wound, then place a dry gauze over the moist one, followed by Kerlix wrap, then ace wrap.   Specific orthopedic injuries instructions  Comminuted L pilon fx s/p ORIF               NWB x 8 weeks             Remain in splint for 2 weeks             PT/OT as needed             Ice and elevate  R subtalar dislocation s/p closed reduction                        Pt placed into cast yesterday             WBAT for transfers only, in cast shoe             Ice and elevate

## 2014-06-25 NOTE — Progress Notes (Signed)
OT Cancellation Note  Patient Details Name: Isabella Jenkins MRN: [id number] DOB: [birth year]   Cancelled Treatment:    Reason Eval/Treat Not Completed: OT screened, no needs identified, will sign off. Pt reports she has been managing at home with help from family and does not foresee any issues with BADLs--"we have figured out what works for me". Pt D/C'ing today.  Almon Register [phone] 06/25/2014, 12:05 PM

## 2014-06-25 NOTE — Progress Notes (Addendum)
Patient has complaints of muscle spasms that are increasing her pain level.  Patient states that she feels as though she has a "charlie horse" in her leg.  On call, Doran Stabler, PA contacted.  Orders received for Zanaflex 2mg  po Q8h prn.    Pharmacy suggested Robaxin or Flexeril as a safer alternative due to patient's pregnancy.  Patient declined both medicines, she states that she has taken both in the past and does not like the way they make her feel.  On call, Doran Stabler, PA contacted.  Orders for Valium 2.5 mg po Q8h prn.  Pharmacy approved.

## 2014-06-25 NOTE — Progress Notes (Signed)
Pt asking for discharge papers. Does not want to wait for Cast Boot to arrive. "I don't need that, I don't walk around anywhere anyway." Discharge instructions given. No questions verbalized. Vitals stable.

## 2014-06-25 NOTE — Progress Notes (Signed)
UR completed 
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
## 2014-06-25 NOTE — Anesthesia Postprocedure Evaluation (Signed)
  Anesthesia Post-op Note  Patient: Isabella Jenkins  Procedure(s) Performed: Procedure(s): OPEN REDUCTION INTERNAL FIXATION (ORIF) TIBIA PILON  FRACTURE WITH REMOVAL OF EXTERNAL FIXATURE  (Left) CAST APPLICATION (Right)  Patient Location: PACU  Anesthesia Type:General and Regional  Level of Consciousness: awake and alert   Airway and Oxygen Therapy: Patient Spontanous Breathing  Post-op Pain: none  Post-op Assessment: Post-op Vital signs reviewed, Patient's Cardiovascular Status Stable, Respiratory Function Stable, Patent Airway, No signs of Nausea or vomiting and Pain level controlled  Post-op Vital Signs: Reviewed and stable  Last Vitals:  Filed Vitals:   06/25/14 0900  BP: 127/80  Pulse: 85  Temp: 36.8 C  Resp: 15    Complications: No apparent anesthesia complications
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
## 2014-07-06 ENCOUNTER — Ambulatory Visit: Admitting: Neurology

## 2014-07-07 NOTE — Op Note (Signed)
Isabella Jenkins, Isabella Jenkins             ACCOUNT NO.:  [id number]  MEDICAL RECORD NO.:  [id number]  LOCATION:  [id number]                        FACILITY:  [facility]  PHYSICIAN:  Astrid Divine. Marcelino Scot, M.D. DATE OF BIRTH:  1976/06/27  DATE OF PROCEDURE:  06/24/2014 DATE OF DISCHARGE:  06/25/2014                              OPERATIVE REPORT   ADDENDUM:  BRIEF SUMMARY OF PROCEDURE:  Isabella Jenkins received preoperative antibiotics.  She was taken to the operating room where general anesthesia was induced.  Her external fixture was then scrubbed and prepped in the usual fashion.  The external fixator pins were then removed.  The areas of pin tract ulceration were then curettaged sequentially removing the superficial, subcu and deep fascia and finally the bone tracts.  These were irrigated thoroughly.  A standard prep and drape was then performed of the leg using chlorhexidine soap and scrub, Betadine scrub, Betadine paint.  Then, a standard time-out was held. The traumatic wound appeared in good condition laterally and consequently, decision was made to proceed with a direct anterior approach to preserve the dorsalis pedis vessel between the two incisions to maintain blood supply to the flap.  Dissection was carried carefully down where the nerve and vessel were identified and mobilized.  The anterior tib and extensors were also mobilized as well.  I was careful to protect the periosteum over the fracture fragments, but there was considerable difficulty in mobilizing these fragments into reduced position initially.  I began in piecemeal fashion using tenaculum and dental pick to restore appropriate reduction at the metaphysis and then at the articular surface.  The 0.045 K-wires and 0.062 K-wires were used to hold the reduction.  I then derotated some segments along the medial malleolus fracture and used an arthrotomy distally to assess the reduction at the articular surface.  I also irrigated the  joint thoroughly.  Subchondral impacted segments were then disimpacted and mobilized down to the articular surface, they would be congruent with the talus.  More K-wires were placed and then ultimately bone graft was filled into the metaphysis and the outer cortex reapproximated and held. C-arm images were obtained showing appropriate reduction at the articular surface into the metaphysis with realignment of the joint. This was secured with 7 distal Rafter screws and 5 standard screws proximally creating a bridge construct.  Wounds were irrigated and then closed in standard layered fashion.  I did elevate the tourniquet for total of 107 minutes because of the extensive metaphyseal exposure.  My assistant, Ainsley Spinner, PA-C assisted throughout and assistance was necessary to help to protect the nerve and vessel, facilitate exposure and participate in obtaining of reduction and its maintenance, he also assisted with wound closure.  After application of a sterile gently compressive dressing following a layered closure, attention was turned to the contralateral ankle where the splint was removed and a right ankle cast placed with the foot in plantigrade position.  There were no signs of skin wound irritation, ulceration with removal of her splint.  PROGNOSIS:  Isabella Jenkins will be weightbearing as tolerated on the right ankle and foot for transfers, but will maintain nonweightbearing.  We anticipate seeing her back in the office in 2  weeks for removal of her sutures.  She is at increased risk for arthritis given the extreme severity of her fracture as well as associated loss of motion.  She may require additional procedure such as injections, arthroscopic debridement or ultimately fusion.  We will discuss pharmacologic prophylaxis given her ongoing pregnancy to make sure that appropriate risks and benefits are considered.     Astrid Divine. Marcelino Scot, M.D.     MHH/MEDQ  D:  07/07/2014  T:   07/07/2014  Job:  [id number]

## 2014-08-25 ENCOUNTER — Ambulatory Visit (INDEPENDENT_AMBULATORY_CARE_PROVIDER_SITE_OTHER): Admitting: Nurse Practitioner

## 2014-08-25 ENCOUNTER — Encounter: Payer: Self-pay | Admitting: Nurse Practitioner

## 2014-08-25 VITALS — BP 122/80 | HR 116 | Ht 64.0 in | Wt 170.6 lb

## 2014-08-25 DIAGNOSIS — Z5181 Encounter for therapeutic drug level monitoring: Secondary | ICD-10-CM

## 2014-08-25 DIAGNOSIS — G40909 Epilepsy, unspecified, not intractable, without status epilepticus: Secondary | ICD-10-CM

## 2014-08-25 NOTE — Patient Instructions (Signed)
Depakote level today depending on level may increase medication F/U in 3 months
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
## 2014-08-26 ENCOUNTER — Telehealth: Payer: Self-pay | Admitting: Nurse Practitioner

## 2014-08-26 DIAGNOSIS — G40909 Epilepsy, unspecified, not intractable, without status epilepticus: Secondary | ICD-10-CM

## 2014-08-26 DIAGNOSIS — Z5181 Encounter for therapeutic drug level monitoring: Secondary | ICD-10-CM

## 2014-08-26 LAB — VALPROIC ACID LEVEL: Valproic Acid Lvl: 45 ug/mL — ABNORMAL LOW (ref 50–100)

## 2014-08-26 MED ORDER — DIVALPROEX SODIUM ER 500 MG PO TB24: 2000.0000 mg | ORAL_TABLET | Freq: Every day | ORAL | Status: DC

## 2014-08-26 NOTE — Telephone Encounter (Signed)
TC to patient made her aware VPA level is 45. Need to increase Depakote to 4 tabs at hs and repeat level in 10 days on November 9th.

## 2014-08-30 ENCOUNTER — Encounter: Payer: Self-pay | Admitting: Nurse Practitioner

## 2014-10-14 ENCOUNTER — Encounter (HOSPITAL_COMMUNITY): Payer: Self-pay | Admitting: *Deleted

## 2014-10-14 ENCOUNTER — Other Ambulatory Visit: Payer: Self-pay

## 2014-10-14 ENCOUNTER — Inpatient Hospital Stay (HOSPITAL_COMMUNITY)
Admission: AD | Admit: 2014-10-14 | Discharge: 2014-10-14 | Disposition: A | Discharge status: Home / Self Care | Payer: Medicaid Other | Source: Ambulatory Visit | Attending: Obstetrics and Gynecology | Admitting: Obstetrics and Gynecology

## 2014-10-14 DIAGNOSIS — Z3A35 35 weeks gestation of pregnancy: Secondary | ICD-10-CM | POA: Diagnosis not present

## 2014-10-14 DIAGNOSIS — Z87891 Personal history of nicotine dependence: Secondary | ICD-10-CM | POA: Insufficient documentation

## 2014-10-14 DIAGNOSIS — F418 Other specified anxiety disorders: Secondary | ICD-10-CM | POA: Insufficient documentation

## 2014-10-14 DIAGNOSIS — O10013 Pre-existing essential hypertension complicating pregnancy, third trimester: Secondary | ICD-10-CM | POA: Diagnosis present

## 2014-10-14 DIAGNOSIS — O99343 Other mental disorders complicating pregnancy, third trimester: Secondary | ICD-10-CM | POA: Diagnosis not present

## 2014-10-14 HISTORY — DX: Other fracture of unspecified lower leg, initial encounter for closed fracture: S82.899A

## 2014-10-14 LAB — URINE MICROSCOPIC-ADD ON

## 2014-10-14 LAB — COMPREHENSIVE METABOLIC PANEL
ALBUMIN: 2 g/dL — AB (ref 3.5–5.2)
ALT: <5 U/L (ref 0–35)
ANION GAP: 13 (ref 5–15)
AST: 9 U/L (ref 0–37)
Alkaline Phosphatase: 139 U/L — ABNORMAL HIGH (ref 39–117)
BUN: 9 mg/dL (ref 6–23)
CO2: 21 mEq/L (ref 19–32)
Calcium: 7.8 mg/dL — ABNORMAL LOW (ref 8.4–10.5)
Chloride: 103 mEq/L (ref 96–112)
Creatinine, Ser: 0.54 mg/dL (ref 0.50–1.10)
GFR calc Af Amer: >90 mL/min (ref >90)
GFR calc non Af Amer: >90 mL/min (ref >90)
Glucose, Bld: 79 mg/dL (ref 70–99)
Potassium: 4.1 mEq/L (ref 3.7–5.3)
SODIUM: 137 meq/L (ref 137–147)
TOTAL PROTEIN: 5.5 g/dL — AB (ref 6.0–8.3)
Total Bilirubin: 0.3 mg/dL (ref 0.3–1.2)

## 2014-10-14 LAB — URINALYSIS, ROUTINE W REFLEX MICROSCOPIC
GLUCOSE, UA: NEGATIVE mg/dL
Ketones, ur: 15 mg/dL — AB
Nitrite: POSITIVE — AB
PROTEIN: 30 mg/dL — AB
Specific Gravity, Urine: >1.03 — ABNORMAL HIGH (ref 1.005–1.030)
UROBILINOGEN UA: 1 mg/dL (ref 0.0–1.0)
pH: 6 (ref 5.0–8.0)

## 2014-10-14 LAB — CBC
HEMATOCRIT: 26.1 % — AB (ref 36.0–46.0)
Hemoglobin: 8.2 g/dL — ABNORMAL LOW (ref 12.0–15.0)
MCH: 24.8 pg — ABNORMAL LOW (ref 26.0–34.0)
MCHC: 31.4 g/dL (ref 30.0–36.0)
MCV: 78.9 fL (ref 78.0–100.0)
Platelets: 281 10*3/uL (ref 150–400)
RBC: 3.31 MIL/uL — ABNORMAL LOW (ref 3.87–5.11)
RDW: 15 % (ref 11.5–15.5)
WBC: 9.9 10*3/uL (ref 4.0–10.5)

## 2014-10-14 LAB — URIC ACID: Uric Acid, Serum: 4.6 mg/dL (ref 2.4–7.0)

## 2014-10-14 LAB — PROTEIN / CREATININE RATIO, URINE
CREATININE, URINE: 261.24 mg/dL
Protein Creatinine Ratio: 0.1 (ref 0.00–0.15)
Total Protein, Urine: 27.4 mg/dL

## 2014-10-14 LAB — OB RESULTS CONSOLE GBS: STREP GROUP B AG: NEGATIVE

## 2014-10-14 LAB — LACTATE DEHYDROGENASE: LDH: 132 U/L (ref 94–250)

## 2014-10-14 NOTE — Discharge Instructions (Signed)

## 2014-10-14 NOTE — MAU Provider Note (Signed)
History     CSN: [id number]  Arrival date and time: 10/14/14 1029   First Provider Initiated Contact with Patient 10/14/14 1126      Chief Complaint  Patient presents with  . Hypertension   Patient is a 38 y.o. female presenting with hypertension. The history is provided by the patient.  Hypertension This is a new problem. The current episode started today. Nothing aggravates the symptoms. She has tried nothing for the symptoms.   Isabella Jenkins is a 38 y.o. [id number] @ [redacted]w[redacted]d gestation who presents to the MAU for evaluation of PIH. She went to the office this morning for her regular OB visit and her blood pressure was elevated. She denies headache, visual change or other problems. She was sent to MAU for blood work and monitoring. A couple days ago the patient states she had some headache and little lines in front of her eyes but none today.   OB History    Gravida Para Term Preterm AB TAB SAB Ectopic Multiple Living   3 2 2       2       Past Medical History  Diagnosis Date  . Depression   . Anxiety   . Mood swings   . Hypertension   . Seizures     Last seizure 8/15  . Ankle fracture     Past Surgical History  Procedure Laterality Date  . Gastric bypass    . Wisdom tooth extraction    . External fixation leg Left 06/10/2014    Procedure: EXTERNAL FIXATION LEFT PILON FRACTURE;  Surgeon: Rozanna Box, MD;  Location: [place];  Service: Orthopedics;  Laterality: Left;  . I&d extremity Left 06/10/2014    Procedure: IRRIGATION AND DEBRIDEMENT LEFT OPEN FRACTURE; INCLUDING BONE;  Surgeon: Rozanna Box, MD;  Location: [place];  Service: Orthopedics;  Laterality: Left;  . Ankle closed reduction Right 06/10/2014    Procedure: CLOSED REDUCTION AND SPLINTING RIGHT TALUS FRACTURE DISLOCATION;  Surgeon: Rozanna Box, MD;  Location: [place];  Service: Orthopedics;  Laterality: Right;  . Orif fibula fracture Left 06/10/2014    Procedure: OPEN REDUCTION INTERNAL FIXATION (ORIF) PILON  FRACTURE, FIBULA ONLY;  Surgeon: Rozanna Box, MD;  Location: [place];  Service: Orthopedics;  Laterality: Left;  . Orif ankle fracture Left 06/24/2014    Procedure: OPEN REDUCTION INTERNAL FIXATION (ORIF) TIBIA PILON  FRACTURE WITH REMOVAL OF EXTERNAL FIXATURE ;  Surgeon: Rozanna Box, MD;  Location: [place];  Service: Orthopedics;  Laterality: Left;  . Cast application Right 9/39/0300    Procedure: CAST APPLICATION;  Surgeon: Rozanna Box, MD;  Location: [place];  Service: Orthopedics;  Laterality: Right;    Family History  Problem Relation Age of Onset  . High blood pressure Mother   . High Cholesterol Mother   . Stroke Father   . High blood pressure Father     History  Substance Use Topics  . Smoking status: Former Smoker    Types: Cigarettes  . Smokeless tobacco: Never Used  . Alcohol Use: No    Allergies:  Allergies  Allergen Reactions  . Ibuprofen Other (See Comments)    Can't take due to gastric surgery.   . Tramadol Other (See Comments)    Can't take this medication with seizure medication.     Prescriptions prior to admission  Medication Sig Dispense Refill Last Dose  . divalproex (DEPAKOTE ER) 500 MG 24 hr tablet Take 4 tablets (2,000 mg total) by  mouth daily. 120 tablet 4 10/13/2014 at Unknown time  . folic acid (FOLVITE) 1 MG tablet Take 1 mg by mouth daily.    10/13/2014 at Unknown time  . Prenatal Vit-Fe Fumarate-FA (PRENATAL MULTIVITAMIN) TABS tablet Take 1 tablet by mouth daily.    10/13/2014 at Unknown time  . HYDROcodone-acetaminophen (NORCO) 10-325 MG per tablet Take 1-2 tablets by mouth every 6 (six) hours as needed for moderate pain or severe pain. (Patient not taking: Reported on 10/14/2014) 90 tablet 0 Taking    ROSBlood pressure 130/88, pulse 83, temperature 97.9 F (36.6 C), temperature source Oral, resp. rate 18, last menstrual period 02/03/2014.  Physical Exam  Constitutional: She is oriented to person, place, and time. She appears  well-developed and well-nourished. No distress.  HENT:  Head: Normocephalic and atraumatic.  Eyes: Conjunctivae and EOM are normal.  Neck: Neck supple.  Cardiovascular: Normal rate.   Respiratory: Effort normal.  GI: Soft. Bowel sounds are normal. There is no tenderness.  Gravid, consistent with dates.   Genitourinary: Vagina normal.  Musculoskeletal: Normal range of motion.  Neurological: She is alert and oriented to person, place, and time.  Skin: Skin is warm and dry.  Psychiatric: She has a normal mood and affect. Her behavior is normal. Judgment and thought content normal.    MAU Course  Procedures  MDM   Assessment and Plan  Care turned over to [facility] @ 1200 noon labs pending.   Taijuan Serviss 10/14/2014, 11:26 AM

## 2014-10-14 NOTE — MAU Note (Signed)
Pt sent from MD office for BP eval, pt states she had a HA 2 days ago, but not today, denies epigastric pain but does state she has SOB @ times.

## 2014-10-14 NOTE — Progress Notes (Signed)
MD informed of lab results, BP readings, reactive FHR - DC order received.  Pt to F/U in office on Monday for NST.

## 2014-10-18 ENCOUNTER — Encounter (HOSPITAL_COMMUNITY): Payer: Self-pay | Admitting: General Practice

## 2014-10-18 ENCOUNTER — Inpatient Hospital Stay (HOSPITAL_COMMUNITY)
Admission: AD | Admit: 2014-10-18 | Discharge: 2014-10-18 | Disposition: A | Discharge status: Home / Self Care | Payer: Medicaid Other | Source: Ambulatory Visit | Attending: Obstetrics and Gynecology | Admitting: Obstetrics and Gynecology

## 2014-10-18 DIAGNOSIS — Z87891 Personal history of nicotine dependence: Secondary | ICD-10-CM | POA: Insufficient documentation

## 2014-10-18 DIAGNOSIS — O133 Gestational [pregnancy-induced] hypertension without significant proteinuria, third trimester: Secondary | ICD-10-CM | POA: Diagnosis not present

## 2014-10-18 DIAGNOSIS — Z3A36 36 weeks gestation of pregnancy: Secondary | ICD-10-CM | POA: Insufficient documentation

## 2014-10-18 DIAGNOSIS — R51 Headache: Secondary | ICD-10-CM | POA: Diagnosis not present

## 2014-10-18 DIAGNOSIS — R030 Elevated blood-pressure reading, without diagnosis of hypertension: Secondary | ICD-10-CM | POA: Diagnosis present

## 2014-10-18 LAB — URINALYSIS, ROUTINE W REFLEX MICROSCOPIC
Bilirubin Urine: NEGATIVE
Glucose, UA: NEGATIVE mg/dL
Hgb urine dipstick: NEGATIVE
Ketones, ur: 15 mg/dL — AB
Leukocytes, UA: NEGATIVE
Nitrite: POSITIVE — AB
Protein, ur: NEGATIVE mg/dL
Specific Gravity, Urine: >1.03 — ABNORMAL HIGH (ref 1.005–1.030)
Urobilinogen, UA: 0.2 mg/dL (ref 0.0–1.0)
pH: 6 (ref 5.0–8.0)

## 2014-10-18 LAB — CBC
HCT: 28 % — ABNORMAL LOW (ref 36.0–46.0)
Hemoglobin: 8.7 g/dL — ABNORMAL LOW (ref 12.0–15.0)
MCH: 24.3 pg — AB (ref 26.0–34.0)
MCHC: 31.1 g/dL (ref 30.0–36.0)
MCV: 78.2 fL (ref 78.0–100.0)
PLATELETS: 321 10*3/uL (ref 150–400)
RBC: 3.58 MIL/uL — ABNORMAL LOW (ref 3.87–5.11)
RDW: 15.4 % (ref 11.5–15.5)
WBC: 9.8 10*3/uL (ref 4.0–10.5)

## 2014-10-18 LAB — COMPREHENSIVE METABOLIC PANEL
ALBUMIN: 2.2 g/dL — AB (ref 3.5–5.2)
ALT: 7 U/L (ref 0–35)
AST: 23 U/L (ref 0–37)
Alkaline Phosphatase: 148 U/L — ABNORMAL HIGH (ref 39–117)
Anion gap: 11 (ref 5–15)
BUN: 11 mg/dL (ref 6–23)
CALCIUM: 7.9 mg/dL — AB (ref 8.4–10.5)
CO2: 23 mEq/L (ref 19–32)
CREATININE: 0.56 mg/dL (ref 0.50–1.10)
Chloride: 102 mEq/L (ref 96–112)
GFR calc Af Amer: >90 mL/min (ref >90)
GFR calc non Af Amer: >90 mL/min (ref >90)
Glucose, Bld: 94 mg/dL (ref 70–99)
Potassium: 4.3 mEq/L (ref 3.7–5.3)
Sodium: 136 mEq/L — ABNORMAL LOW (ref 137–147)
Total Bilirubin: 0.3 mg/dL (ref 0.3–1.2)
Total Protein: 6.4 g/dL (ref 6.0–8.3)

## 2014-10-18 LAB — URINE MICROSCOPIC-ADD ON

## 2014-10-18 LAB — PROTEIN / CREATININE RATIO, URINE
CREATININE, URINE: 216.29 mg/dL
Protein Creatinine Ratio: 0.09 (ref 0.00–0.15)
TOTAL PROTEIN, URINE: 19.7 mg/dL

## 2014-10-18 LAB — URIC ACID: URIC ACID, SERUM: 4.4 mg/dL (ref 2.4–7.0)

## 2014-10-18 MED ORDER — ACETAMINOPHEN 325 MG PO TABS
650.0000 mg | ORAL_TABLET | Freq: Once | ORAL | Status: AC
  Administered 2014-10-18: 650 mg via ORAL
  Filled 2014-10-18: qty 2
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
## 2014-10-18 NOTE — Discharge Instructions (Signed)

## 2014-10-18 NOTE — MAU Note (Signed)
Pt sent from MD office for hypertension, C/O mild HA x 2 days, also mild blurred vision, pedal edema noted.  Pt denies uc's, bleeding or LOF.

## 2014-10-20 ENCOUNTER — Telehealth (HOSPITAL_COMMUNITY): Payer: Self-pay | Admitting: *Deleted

## 2014-10-20 ENCOUNTER — Encounter (HOSPITAL_COMMUNITY): Payer: Self-pay | Admitting: *Deleted

## 2014-10-20 NOTE — Telephone Encounter (Signed)
Preadmission screen  

## 2014-10-25 ENCOUNTER — Inpatient Hospital Stay (HOSPITAL_COMMUNITY): Payer: Medicaid Other | Admitting: Anesthesiology

## 2014-10-25 ENCOUNTER — Encounter (HOSPITAL_COMMUNITY): Payer: Self-pay

## 2014-10-25 ENCOUNTER — Inpatient Hospital Stay (HOSPITAL_COMMUNITY)
Admission: AD | Admit: 2014-10-25 | Discharge: 2014-10-27 | DRG: 774 | Disposition: A | Discharge status: Home / Self Care | Payer: Medicaid Other | Source: Ambulatory Visit | Attending: Obstetrics & Gynecology | Admitting: Obstetrics & Gynecology

## 2014-10-25 DIAGNOSIS — O133 Gestational [pregnancy-induced] hypertension without significant proteinuria, third trimester: Secondary | ICD-10-CM | POA: Diagnosis present

## 2014-10-25 DIAGNOSIS — O1092 Unspecified pre-existing hypertension complicating childbirth: Secondary | ICD-10-CM | POA: Diagnosis present

## 2014-10-25 DIAGNOSIS — O09523 Supervision of elderly multigravida, third trimester: Secondary | ICD-10-CM | POA: Diagnosis not present

## 2014-10-25 DIAGNOSIS — O9081 Anemia of the puerperium: Secondary | ICD-10-CM | POA: Diagnosis not present

## 2014-10-25 DIAGNOSIS — Z3A37 37 weeks gestation of pregnancy: Secondary | ICD-10-CM | POA: Diagnosis present

## 2014-10-25 DIAGNOSIS — IMO0001 Reserved for inherently not codable concepts without codable children: Secondary | ICD-10-CM

## 2014-10-25 DIAGNOSIS — Z3483 Encounter for supervision of other normal pregnancy, third trimester: Secondary | ICD-10-CM | POA: Diagnosis present

## 2014-10-25 HISTORY — DX: Bariatric surgery status: Z98.84

## 2014-10-25 HISTORY — DX: Encounter for other specified aftercare: Z51.89

## 2014-10-25 LAB — COMPREHENSIVE METABOLIC PANEL
ALBUMIN: 2.6 g/dL — AB (ref 3.5–5.2)
ALT: 7 U/L (ref 0–35)
ANION GAP: 6 (ref 5–15)
AST: 13 U/L (ref 0–37)
Alkaline Phosphatase: 155 U/L — ABNORMAL HIGH (ref 39–117)
BILIRUBIN TOTAL: 0.4 mg/dL (ref 0.3–1.2)
BUN: 13 mg/dL (ref 6–23)
CO2: 23 mmol/L (ref 19–32)
CREATININE: 0.66 mg/dL (ref 0.50–1.10)
Calcium: 8.1 mg/dL — ABNORMAL LOW (ref 8.4–10.5)
Chloride: 106 mEq/L (ref 96–112)
GFR calc non Af Amer: >90 mL/min (ref >90)
Glucose, Bld: 81 mg/dL (ref 70–99)
Potassium: 4.1 mmol/L (ref 3.5–5.1)
Sodium: 135 mmol/L (ref 135–145)
Total Protein: 6.4 g/dL (ref 6.0–8.3)

## 2014-10-25 LAB — CBC
HCT: 29 % — ABNORMAL LOW (ref 36.0–46.0)
HCT: 27.8 % — ABNORMAL LOW (ref 36.0–46.0)
HEMATOCRIT: 27.3 % — AB (ref 36.0–46.0)
HEMOGLOBIN: 8.6 g/dL — AB (ref 12.0–15.0)
HEMOGLOBIN: 8.5 g/dL — AB (ref 12.0–15.0)
Hemoglobin: 8.9 g/dL — ABNORMAL LOW (ref 12.0–15.0)
MCH: 24.1 pg — AB (ref 26.0–34.0)
MCH: 24 pg — AB (ref 26.0–34.0)
MCH: 24.2 pg — AB (ref 26.0–34.0)
MCHC: 30.7 g/dL (ref 30.0–36.0)
MCHC: 30.9 g/dL (ref 30.0–36.0)
MCHC: 31.1 g/dL (ref 30.0–36.0)
MCV: 78.4 fL (ref 78.0–100.0)
MCV: 77.7 fL — AB (ref 78.0–100.0)
MCV: 77.8 fL — ABNORMAL LOW (ref 78.0–100.0)
Platelets: 298 10*3/uL (ref 150–400)
Platelets: 267 10*3/uL (ref 150–400)
Platelets: 256 10*3/uL (ref 150–400)
RBC: 3.7 MIL/uL — AB (ref 3.87–5.11)
RBC: 3.58 MIL/uL — ABNORMAL LOW (ref 3.87–5.11)
RBC: 3.51 MIL/uL — AB (ref 3.87–5.11)
RDW: 15.1 % (ref 11.5–15.5)
RDW: 15.2 % (ref 11.5–15.5)
RDW: 15.3 % (ref 11.5–15.5)
WBC: 11 10*3/uL — ABNORMAL HIGH (ref 4.0–10.5)
WBC: 10.2 10*3/uL (ref 4.0–10.5)
WBC: 14.7 10*3/uL — ABNORMAL HIGH (ref 4.0–10.5)

## 2014-10-25 LAB — RPR

## 2014-10-25 LAB — LACTATE DEHYDROGENASE: LDH: 139 U/L (ref 94–250)

## 2014-10-25 LAB — PREPARE RBC (CROSSMATCH)

## 2014-10-25 LAB — ABO/RH: ABO/RH(D): A POS

## 2014-10-25 MED ORDER — CITRIC ACID-SODIUM CITRATE 334-500 MG/5ML PO SOLN: 30.0000 mL | ORAL | Status: DC | PRN

## 2014-10-25 MED ORDER — OXYTOCIN BOLUS FROM INFUSION
500.0000 mL | INTRAVENOUS | Status: DC
  Administered 2014-10-25: 500 mL via INTRAVENOUS

## 2014-10-25 MED ORDER — ZOLPIDEM TARTRATE 5 MG PO TABS: 5.0000 mg | ORAL_TABLET | Freq: Every evening | ORAL | Status: DC | PRN

## 2014-10-25 MED ORDER — OXYTOCIN 40 UNITS IN LACTATED RINGERS INFUSION - SIMPLE MED
1.0000 m[IU]/min | INTRAVENOUS | Status: DC
  Administered 2014-10-25: 2 m[IU]/min via INTRAVENOUS

## 2014-10-25 MED ORDER — TETANUS-DIPHTH-ACELL PERTUSSIS 5-2.5-18.5 LF-MCG/0.5 IM SUSP: 0.5000 mL | Freq: Once | INTRAMUSCULAR | Status: DC

## 2014-10-25 MED ORDER — DIBUCAINE 1 % RE OINT: 1.0000 "application " | TOPICAL_OINTMENT | RECTAL | Status: DC | PRN

## 2014-10-25 MED ORDER — LACTATED RINGERS IV SOLN
INTRAVENOUS | Status: DC
  Administered 2014-10-25 (×2): via INTRAVENOUS

## 2014-10-25 MED ORDER — EPHEDRINE 5 MG/ML INJ
10.0000 mg | INTRAVENOUS | Status: DC | PRN
  Filled 2014-10-25: qty 2

## 2014-10-25 MED ORDER — PHENYLEPHRINE 40 MCG/ML (10ML) SYRINGE FOR IV PUSH (FOR BLOOD PRESSURE SUPPORT)
80.0000 ug | PREFILLED_SYRINGE | INTRAVENOUS | Status: DC | PRN
  Filled 2014-10-25: qty 2
  Filled 2014-10-25: qty 10

## 2014-10-25 MED ORDER — WITCH HAZEL-GLYCERIN EX PADS: 1.0000 "application " | MEDICATED_PAD | CUTANEOUS | Status: DC | PRN

## 2014-10-25 MED ORDER — ACETAMINOPHEN 325 MG PO TABS
650.0000 mg | ORAL_TABLET | ORAL | Status: DC | PRN
  Administered 2014-10-25: 650 mg via ORAL
  Filled 2014-10-25: qty 2

## 2014-10-25 MED ORDER — ONDANSETRON HCL 4 MG PO TABS
4.0000 mg | ORAL_TABLET | ORAL | Status: DC | PRN
  Filled 2014-10-25: qty 1

## 2014-10-25 MED ORDER — OXYTOCIN 40 UNITS IN LACTATED RINGERS INFUSION - SIMPLE MED: 62.5000 mL/h | INTRAVENOUS | Status: DC | PRN

## 2014-10-25 MED ORDER — DIPHENHYDRAMINE HCL 50 MG/ML IJ SOLN: 12.5000 mg | INTRAMUSCULAR | Status: DC | PRN

## 2014-10-25 MED ORDER — MISOPROSTOL 200 MCG PO TABS
ORAL_TABLET | ORAL | Status: AC
  Administered 2014-10-25: 1000 ug
  Filled 2014-10-25: qty 5

## 2014-10-25 MED ORDER — SIMETHICONE 80 MG PO CHEW: 80.0000 mg | CHEWABLE_TABLET | ORAL | Status: DC | PRN

## 2014-10-25 MED ORDER — LACTATED RINGERS IV SOLN
500.0000 mL | Freq: Once | INTRAVENOUS | Status: AC
  Administered 2014-10-25: 500 mL via INTRAVENOUS

## 2014-10-25 MED ORDER — SENNOSIDES-DOCUSATE SODIUM 8.6-50 MG PO TABS
2.0000 | ORAL_TABLET | ORAL | Status: DC
  Filled 2014-10-25: qty 2

## 2014-10-25 MED ORDER — PHENYLEPHRINE 40 MCG/ML (10ML) SYRINGE FOR IV PUSH (FOR BLOOD PRESSURE SUPPORT)
80.0000 ug | PREFILLED_SYRINGE | INTRAVENOUS | Status: DC | PRN
  Filled 2014-10-25: qty 2

## 2014-10-25 MED ORDER — DIPHENHYDRAMINE HCL 25 MG PO CAPS: 25.0000 mg | ORAL_CAPSULE | Freq: Four times a day (QID) | ORAL | Status: DC | PRN

## 2014-10-25 MED ORDER — PRENATAL MULTIVITAMIN CH
1.0000 | ORAL_TABLET | Freq: Every day | ORAL | Status: DC
  Administered 2014-10-26: 1 via ORAL
  Filled 2014-10-25: qty 1

## 2014-10-25 MED ORDER — IBUPROFEN 600 MG PO TABS: 600.0000 mg | ORAL_TABLET | Freq: Four times a day (QID) | ORAL | Status: DC

## 2014-10-25 MED ORDER — OXYTOCIN 40 UNITS IN LACTATED RINGERS INFUSION - SIMPLE MED
62.5000 mL/h | INTRAVENOUS | Status: DC
  Administered 2014-10-25: 62.5 mL/h via INTRAVENOUS
  Filled 2014-10-25: qty 1000

## 2014-10-25 MED ORDER — SODIUM CHLORIDE 0.9 % IV SOLN: Freq: Once | INTRAVENOUS | Status: DC

## 2014-10-25 MED ORDER — OXYCODONE-ACETAMINOPHEN 5-325 MG PO TABS
1.0000 | ORAL_TABLET | ORAL | Status: DC | PRN
  Administered 2014-10-26 (×2): 1 via ORAL
  Filled 2014-10-25 (×2): qty 1

## 2014-10-25 MED ORDER — LIDOCAINE HCL (PF) 1 % IJ SOLN
30.0000 mL | INTRAMUSCULAR | Status: DC | PRN
  Filled 2014-10-25: qty 30

## 2014-10-25 MED ORDER — LANOLIN HYDROUS EX OINT
TOPICAL_OINTMENT | CUTANEOUS | Status: DC | PRN
Start: 2014-10-25 — End: 2014-10-27

## 2014-10-25 MED ORDER — TERBUTALINE SULFATE 1 MG/ML IJ SOLN
0.2500 mg | Freq: Once | INTRAMUSCULAR | Status: AC | PRN
Start: 2014-10-25 — End: 2014-10-25

## 2014-10-25 MED ORDER — ONDANSETRON HCL 4 MG/2ML IJ SOLN: 4.0000 mg | Freq: Four times a day (QID) | INTRAMUSCULAR | Status: DC | PRN

## 2014-10-25 MED ORDER — LIDOCAINE HCL (PF) 1 % IJ SOLN
INTRAMUSCULAR | Status: DC | PRN
  Administered 2014-10-25 (×2): 5 mL

## 2014-10-25 MED ORDER — OXYCODONE-ACETAMINOPHEN 5-325 MG PO TABS
2.0000 | ORAL_TABLET | ORAL | Status: DC | PRN
  Administered 2014-10-26 – 2014-10-27 (×3): 2 via ORAL
  Filled 2014-10-25 (×3): qty 2

## 2014-10-25 MED ORDER — LACTATED RINGERS IV SOLN: 500.0000 mL | INTRAVENOUS | Status: DC | PRN

## 2014-10-25 MED ORDER — BENZOCAINE-MENTHOL 20-0.5 % EX AERO
1.0000 "application " | INHALATION_SPRAY | CUTANEOUS | Status: DC | PRN
  Administered 2014-10-26: 1 via TOPICAL
  Filled 2014-10-25: qty 56

## 2014-10-25 MED ORDER — DIVALPROEX SODIUM ER 500 MG PO TB24
2000.0000 mg | ORAL_TABLET | Freq: Every day | ORAL | Status: DC
  Administered 2014-10-25 – 2014-10-26 (×2): 2000 mg via ORAL
  Filled 2014-10-25 (×3): qty 4

## 2014-10-25 MED ORDER — FENTANYL 2.5 MCG/ML BUPIVACAINE 1/10 % EPIDURAL INFUSION (WH - ANES)
14.0000 mL/h | INTRAMUSCULAR | Status: DC | PRN
  Administered 2014-10-25: 14 mL/h via EPIDURAL
  Filled 2014-10-25: qty 125

## 2014-10-25 MED ORDER — ONDANSETRON HCL 4 MG/2ML IJ SOLN: 4.0000 mg | INTRAMUSCULAR | Status: DC | PRN

## 2014-10-25 NOTE — Progress Notes (Signed)
Isabella Jenkins is a 38 y.o. G3P2002 at [redacted]w[redacted]d by LMP admitted for induction of labor due to Hypertension.  Subjective: Patient starting to feel her contractions.   Objective: BP 136/87 mmHg  Pulse 82  Temp(Src) 98.1 F (36.7 C) (Oral)  Resp 18  Ht 5\' 4"  (1.626 m)  Wt 81.194 kg (179 lb)  BMI 30.71 kg/m2  LMP 02/03/2014      FHT:  FHR: 130 bpm, variability: moderate,  accelerations:  Present,  decelerations:  Absent UC:   regular, every 3 minutes SVE:   Dilation: 3.5 Effacement (%): 70 Station: -2 Exam by:: Dr Alwyn Pea  Labs: Lab Results  Component Value Date   WBC 11.0* 10/25/2014   HGB 8.9* 10/25/2014   HCT 29.0* 10/25/2014   MCV 78.4 10/25/2014   PLT 298 10/25/2014    Assessment / Plan: Induction of labor due to gestational hypertension,  progressing well on pitocin  Labor: Progressing on Pitocin, will continue to increase then AROM ( attempt made to AROM, scanty fluid, unsure if actual membranes ruptured) Preeclampsia:  no signs or symptoms of toxicity Fetal Wellbeing:  Category I Pain Control:  Labor support without medications I/D:  n/a Anticipated MOD:  NSVD  Salsabeel Gorelick STACIA 10/25/2014, 4:50 PM

## 2014-10-25 NOTE — H&P (Signed)
Isabella Jenkins is a 38 y.o. female presenting for induction of labor for gestational hypertension.  Maternal Medical History:  Reason for admission: Nausea. Induction of labor for gestational hypertension.   Contractions: Onset was 3-5 hours ago.   Frequency: rare.   Duration is approximately 60 seconds.   Perceived severity is moderate.    Fetal activity: Perceived fetal activity is normal.   Last perceived fetal movement was within the past 12 hours.    Prenatal complications: PIH and infection.   Prenatal Complications - Diabetes: none.    OB History    Gravida Para Term Preterm AB TAB SAB Ectopic Multiple Living   3 2 2       2      Past Medical History  Diagnosis Date  . Anxiety   . Mood swings   . Hypertension   . Seizures     Last seizure 8/15  . Ankle fracture   . Blood transfusion without reported diagnosis    Past Surgical History  Procedure Laterality Date  . Gastric bypass    . Wisdom tooth extraction    . External fixation leg Left 06/10/2014    Procedure: EXTERNAL FIXATION LEFT PILON FRACTURE;  Surgeon: Rozanna Box, MD;  Location: [place];  Service: Orthopedics;  Laterality: Left;  . I&d extremity Left 06/10/2014    Procedure: IRRIGATION AND DEBRIDEMENT LEFT OPEN FRACTURE; INCLUDING BONE;  Surgeon: Rozanna Box, MD;  Location: [place];  Service: Orthopedics;  Laterality: Left;  . Ankle closed reduction Right 06/10/2014    Procedure: CLOSED REDUCTION AND SPLINTING RIGHT TALUS FRACTURE DISLOCATION;  Surgeon: Rozanna Box, MD;  Location: [place];  Service: Orthopedics;  Laterality: Right;  . Orif fibula fracture Left 06/10/2014    Procedure: OPEN REDUCTION INTERNAL FIXATION (ORIF) PILON FRACTURE, FIBULA ONLY;  Surgeon: Rozanna Box, MD;  Location: [place];  Service: Orthopedics;  Laterality: Left;  . Orif ankle fracture Left 06/24/2014    Procedure: OPEN REDUCTION INTERNAL FIXATION (ORIF) TIBIA PILON  FRACTURE WITH REMOVAL OF EXTERNAL FIXATURE ;  Surgeon:  Rozanna Box, MD;  Location: [place];  Service: Orthopedics;  Laterality: Left;  . Cast application Right 3/66/2947    Procedure: CAST APPLICATION;  Surgeon: Rozanna Box, MD;  Location: [place];  Service: Orthopedics;  Laterality: Right;   Family History: family history includes High Cholesterol in her mother; High blood pressure in her father and mother; Stroke in her father. Social History:  reports that she has quit smoking. Her smoking use included Cigarettes. She smoked 0.00 packs per day. She has never used smokeless tobacco. She reports that she does not drink alcohol or use illicit drugs.   Prenatal Transfer Tool  Maternal Diabetes: No Genetic Screening: Normal Amnio 64 XX Maternal Ultrasounds/Referrals: Normal Fetal Ultrasounds or other Referrals:  Other: anatomy scan normal Maternal Substance Abuse:  No Significant Maternal Medications:  None Significant Maternal Lab Results:  Lab values include: Group B Strep negative Other Comments:  None  Review of Systems  Constitutional: Negative for fever and chills.  Eyes: Negative for blurred vision and double vision.  Respiratory: Negative for cough.   Cardiovascular: Negative for chest pain.  Gastrointestinal: Negative for heartburn and nausea.  Genitourinary: Negative for dysuria.  Musculoskeletal: Negative for myalgias.  Neurological: Positive for headaches. Negative for dizziness.  Psychiatric/Behavioral: Negative for depression.  All other systems reviewed and are negative.   Dilation: 2.5 Effacement (%): 70 Station: -2 Exam by:: Dr Alwyn Pea Blood pressure 131/73,  pulse 97, temperature 98.1 F (36.7 C), temperature source Oral, resp. rate 18, height 5\' 4"  (1.626 m), weight 81.194 kg (179 lb), last menstrual period 02/03/2014. Maternal Exam:  Uterine Assessment: Contraction strength is mild.  Contraction duration is 60 seconds. Contraction frequency is rare.   Abdomen: Patient reports no abdominal tenderness. Fundal  height is 37 cm.   Estimated fetal weight is 2800 grams.   Fetal presentation: vertex  Introitus: Normal vulva. Normal vagina.  Ferning test: not done.  Nitrazine test: not done. Amniotic fluid character: not assessed.  Pelvis: adequate for delivery.   Cervix: Cervix evaluated by digital exam.   2.5/50/-2  Fetal Exam Fetal Monitor Review: Mode: hand-held doppler probe.   Baseline rate: 135.  Variability: moderate (6-25 bpm).   Pattern: no decelerations and accelerations present.    Fetal State Assessment: Category I - tracings are normal.     Physical Exam  Vitals reviewed. Constitutional: She is oriented to person, place, and time. She appears well-developed and well-nourished.  HENT:  Head: Normocephalic and atraumatic.  Eyes: Pupils are equal, round, and reactive to light.  Neck: Normal range of motion.  Cardiovascular: Normal rate, regular rhythm and normal heart sounds.   Respiratory: Effort normal and breath sounds normal.  GI: Soft. Bowel sounds are normal.  Genitourinary: Vagina normal.  Musculoskeletal: Normal range of motion.  Neurological: She is alert and oriented to person, place, and time. She has normal reflexes.    Prenatal labs: ABO, Rh: --/--/A POS, A POS (12/28 0750) Antibody: NEG (12/28 0750) Rubella: Immune (07/22 0000) RPR: NON REAC (12/28 0750)  HBsAg: Negative (07/22 0000)  HIV: Non-reactive (07/22 0000)  GBS: Negative (12/17 0000)   Assessment/Plan: 38 yo G3P2 at 37 weeks 5 days with gestational hypertension, BPs mild range Induction with pitocin d/t favorable cervix Epidural on demand Continuous monitoring.  Anticipate NSVD  Isabella Jenkins, [place] 10/25/2014, 4:23 PM

## 2014-10-25 NOTE — Anesthesia Procedure Notes (Signed)
Epidural Patient location during procedure: OB Start time: 10/25/2014 8:08 PM  Staffing Anesthesiologist: Rudean Curt Performed by: anesthesiologist   Preanesthetic Checklist Completed: patient identified, site marked, surgical consent, pre-op evaluation, timeout performed, IV checked, risks and benefits discussed and monitors and equipment checked  Epidural Patient position: sitting Prep: site prepped and draped and DuraPrep Patient monitoring: continuous pulse ox and blood pressure Approach: midline Location: L3-L4 Injection technique: LOR air  Needle:  Needle type: Tuohy  Needle gauge: 17 G Needle length: 9 cm and 9 Needle insertion depth: 6 cm Catheter type: closed end flexible Catheter size: 19 Gauge Catheter at skin depth: 12 cm Test dose: negative  Assessment Events: blood not aspirated, injection not painful, no injection resistance, negative IV test and no paresthesia  Additional Notes Patient identified.  Risk benefits discussed including failed block, incomplete pain control, headache, nerve damage, paralysis, blood pressure changes, nausea, vomiting, reactions to medication both toxic or allergic, and postpartum back pain.  Patient expressed understanding and wished to proceed.  All questions were answered.  Sterile technique used throughout procedure and epidural site dressed with sterile barrier dressing. No paresthesia or other complications noted.The patient did not experience any signs of intravascular injection such as tinnitus or metallic taste in mouth nor signs of intrathecal spread such as rapid motor block. Please see nursing notes for vital signs.

## 2014-10-25 NOTE — Anesthesia Preprocedure Evaluation (Signed)
Anesthesia Evaluation  Patient identified by MRN, date of birth, ID band Patient awake    Reviewed: Allergy & Precautions, H&P , Patient's Chart, lab work & pertinent test results  Airway Mallampati: II  TM Distance: >3 FB Neck ROM: full    Dental   Pulmonary former smoker,  breath sounds clear to auscultation        Cardiovascular hypertension, Rhythm:regular Rate:Normal     Neuro/Psych Seizures -,  PSYCHIATRIC DISORDERS    GI/Hepatic   Endo/Other    Renal/GU      Musculoskeletal   Abdominal   Peds  Hematology  (+) anemia ,   Anesthesia Other Findings   Reproductive/Obstetrics (+) Pregnancy                             Anesthesia Physical Anesthesia Plan  ASA: III  Anesthesia Plan: Epidural   Post-op Pain Management:    Induction:   Airway Management Planned:   Additional Equipment:   Intra-op Plan:   Post-operative Plan:   Informed Consent: I have reviewed the patients History and Physical, chart, labs and discussed the procedure including the risks, benefits and alternatives for the proposed anesthesia with the patient or authorized representative who has indicated his/her understanding and acceptance.     Plan Discussed with:   Anesthesia Plan Comments:         Anesthesia Quick Evaluation

## 2014-10-26 LAB — CBC
HCT: 24.5 % — ABNORMAL LOW (ref 36.0–46.0)
Hemoglobin: 7.7 g/dL — ABNORMAL LOW (ref 12.0–15.0)
MCH: 24.4 pg — AB (ref 26.0–34.0)
MCHC: 31.4 g/dL (ref 30.0–36.0)
MCV: 77.5 fL — ABNORMAL LOW (ref 78.0–100.0)
PLATELETS: 224 10*3/uL (ref 150–400)
RBC: 3.16 MIL/uL — ABNORMAL LOW (ref 3.87–5.11)
RDW: 15 % (ref 11.5–15.5)
WBC: 12 10*3/uL — ABNORMAL HIGH (ref 4.0–10.5)

## 2014-10-26 MED ORDER — FERROUS SULFATE 325 (65 FE) MG PO TABS
325.0000 mg | ORAL_TABLET | Freq: Two times a day (BID) | ORAL | Status: DC
  Administered 2014-10-27: 325 mg via ORAL
  Filled 2014-10-26: qty 1

## 2014-10-26 NOTE — Anesthesia Postprocedure Evaluation (Signed)
  Anesthesia Post-op Note  Patient: Isabella Jenkins  Procedure(s) Performed: * No procedures listed *  Patient Location: PACU and Mother/Baby  Anesthesia Type:Epidural  Level of Consciousness: awake, alert  and oriented  Airway and Oxygen Therapy: Patient Spontanous Breathing  Post-op Pain: mild  Post-op Assessment: Patient's Cardiovascular Status Stable, Respiratory Function Stable, No signs of Nausea or vomiting, Adequate PO intake, Pain level controlled and No headache  Post-op Vital Signs: Reviewed and stable  Last Vitals:  Filed Vitals:   10/26/14 0400  BP: 146/85  Pulse: 79  Temp: 36.7 C  Resp: 18    Complications: No apparent anesthesia complications
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
## 2014-10-26 NOTE — Progress Notes (Signed)
Patient is eating, ambulating, voiding.  Pain control is good.  Appropriate lochia.  No complaints.  Filed Vitals:   10/25/14 2148 10/25/14 2250 10/25/14 2355 10/26/14 0400  BP: 147/89 145/91 127/83 146/85  Pulse: 79 86 78 79  Temp:  97.8 F (36.6 C) 98 F (36.7 C) 98 F (36.7 C)  TempSrc:  Oral Oral Oral  Resp:  18 18 18   Height:      Weight:      SpO2:  100% 100% 100%    Fundus firm Perineum without swelling. Inc: c/d/i Ext: no CT  Lab Results  Component Value Date   WBC 12.0* 10/26/2014   HGB 7.7* 10/26/2014   HCT 24.5* 10/26/2014   MCV 77.5* 10/26/2014   PLT 224 10/26/2014    --/--/A POS, A POS (12/28 0750)  A/P Postpartum day #1.  Routine care.   Anemia: Fe bid. Expect d/c 12/30.    Allyn Kenner

## 2014-10-27 LAB — TYPE AND SCREEN
ABO/RH(D): A POS
ANTIBODY SCREEN: NEGATIVE
Unit division: 0
Unit division: 0

## 2014-10-27 MED ORDER — OXYCODONE-ACETAMINOPHEN 5-325 MG PO TABS: 2.0000 | ORAL_TABLET | ORAL | Status: DC | PRN

## 2014-10-27 NOTE — Discharge Summary (Signed)
Obstetric Discharge Summary Reason for Admission: induction of labor Prenatal Procedures: ultrasound Intrapartum Procedures: spontaneous vaginal delivery Postpartum Procedures: none Complications-Operative and Postpartum: none HEMOGLOBIN  Date Value Ref Range Status  10/26/2014 7.7* 12.0 - 15.0 g/dL Final   HCT  Date Value Ref Range Status  10/26/2014 24.5* 36.0 - 46.0 % Final    Physical Exam:  General: alert and cooperative Lochia: appropriate Uterine Fundus: firm DVT Evaluation: No evidence of DVT seen on physical exam.  Discharge Diagnoses: Term Pregnancy-delivered  Discharge Information: Date: 10/27/2014 Activity: pelvic rest Diet: routine Medications: PNV, percocet Condition: stable Instructions: refer to practice specific booklet Discharge to: home Follow-up Information    Follow up with Jerelyn Charles, MD In 4 weeks.   Specialty:  Obstetrics   Contact information:   [address] [place] [address] [phone]       Newborn Data: Live born female  Birth Weight: 4 lb 6.4 oz (1995 g) APGAR: 9, 9  Home with mother.  Allyn Kenner 10/27/2014, 9:52 AM

## 2014-11-25 ENCOUNTER — Ambulatory Visit: Admitting: Nurse Practitioner

## 2015-01-20 ENCOUNTER — Inpatient Hospital Stay (HOSPITAL_COMMUNITY)
Admission: AD | Admit: 2015-01-20 | Discharge: 2015-01-20 | Disposition: A | Discharge status: Home / Self Care | Payer: Medicaid Other | Source: Ambulatory Visit | Attending: Obstetrics and Gynecology | Admitting: Obstetrics and Gynecology

## 2015-01-20 ENCOUNTER — Inpatient Hospital Stay (HOSPITAL_COMMUNITY): Payer: Medicaid Other

## 2015-01-20 DIAGNOSIS — D649 Anemia, unspecified: Secondary | ICD-10-CM | POA: Insufficient documentation

## 2015-01-20 DIAGNOSIS — N939 Abnormal uterine and vaginal bleeding, unspecified: Secondary | ICD-10-CM | POA: Diagnosis present

## 2015-01-20 DIAGNOSIS — Z9884 Bariatric surgery status: Secondary | ICD-10-CM | POA: Diagnosis not present

## 2015-01-20 DIAGNOSIS — Z79899 Other long term (current) drug therapy: Secondary | ICD-10-CM | POA: Diagnosis not present

## 2015-01-20 DIAGNOSIS — Z87891 Personal history of nicotine dependence: Secondary | ICD-10-CM | POA: Diagnosis not present

## 2015-01-20 DIAGNOSIS — Q51810 Arcuate uterus: Secondary | ICD-10-CM | POA: Insufficient documentation

## 2015-01-20 DIAGNOSIS — N920 Excessive and frequent menstruation with regular cycle: Secondary | ICD-10-CM

## 2015-01-20 LAB — CBC WITH DIFFERENTIAL/PLATELET
Basophils Absolute: 0 10*3/uL (ref 0.0–0.1)
Basophils Relative: 1 % (ref 0–1)
EOS PCT: 3 % (ref 0–5)
Eosinophils Absolute: 0.2 10*3/uL (ref 0.0–0.7)
HCT: 28.5 % — ABNORMAL LOW (ref 36.0–46.0)
Hemoglobin: 8.8 g/dL — ABNORMAL LOW (ref 12.0–15.0)
LYMPHS ABS: 1.4 10*3/uL (ref 0.7–4.0)
Lymphocytes Relative: 24 % (ref 12–46)
MCH: 24.1 pg — AB (ref 26.0–34.0)
MCHC: 30.9 g/dL (ref 30.0–36.0)
MCV: 78.1 fL (ref 78.0–100.0)
MONOS PCT: 7 % (ref 3–12)
Monocytes Absolute: 0.4 10*3/uL (ref 0.1–1.0)
Neutro Abs: 3.6 10*3/uL (ref 1.7–7.7)
Neutrophils Relative %: 65 % (ref 43–77)
Platelets: 243 10*3/uL (ref 150–400)
RBC: 3.65 MIL/uL — ABNORMAL LOW (ref 3.87–5.11)
RDW: 19.5 % — ABNORMAL HIGH (ref 11.5–15.5)
WBC: 5.6 10*3/uL (ref 4.0–10.5)

## 2015-01-20 MED ORDER — HYDROMORPHONE HCL 1 MG/ML IJ SOLN
0.5000 mg | Freq: Once | INTRAMUSCULAR | Status: AC
  Administered 2015-01-20: 0.5 mg via INTRAVENOUS
  Filled 2015-01-20: qty 1

## 2015-01-20 MED ORDER — NORGESTIMATE-ETH ESTRADIOL 0.25-35 MG-MCG PO TABS: 1.0000 | ORAL_TABLET | Freq: Every day | ORAL | Status: DC

## 2015-01-20 MED ORDER — LACTATED RINGERS IV SOLN
INTRAVENOUS | Status: DC
  Administered 2015-01-20: 17:00:00 via INTRAVENOUS

## 2015-01-20 MED ORDER — ONDANSETRON 8 MG PO TBDP: 8.0000 mg | ORAL_TABLET | Freq: Once | ORAL | Status: DC

## 2015-01-20 MED ORDER — NORETHIN-ETH ESTRADIOL-FE 0.4-35 MG-MCG PO CHEW
2.0000 | CHEWABLE_TABLET | Freq: Once | ORAL | Status: AC
  Administered 2015-01-20: 2 via ORAL
  Filled 2015-01-20: qty 2

## 2015-01-20 MED ORDER — NORETHIN-ETH ESTRADIOL-FE 0.4-35 MG-MCG PO CHEW: 2.0000 | CHEWABLE_TABLET | Freq: Once | ORAL | Status: DC

## 2015-01-20 MED ORDER — LACTATED RINGERS IV SOLN
INTRAVENOUS | Status: DC
  Administered 2015-01-20: 20:00:00 via INTRAVENOUS

## 2015-01-20 MED ORDER — ONDANSETRON HCL 4 MG/2ML IJ SOLN
4.0000 mg | Freq: Once | INTRAMUSCULAR | Status: AC
  Administered 2015-01-20: 4 mg via INTRAVENOUS
  Filled 2015-01-20: qty 2

## 2015-01-20 MED ORDER — OXYCODONE-ACETAMINOPHEN 5-325 MG PO TABS: 2.0000 | ORAL_TABLET | ORAL | Status: DC | PRN

## 2015-01-20 NOTE — MAU Note (Signed)
Pt on phone at this time, waiting for orders

## 2015-01-20 NOTE — MAU Note (Signed)
Heavy bleeding, since yesterday pad every 30 min, gush and went to work, 4 hours.  Continuous since then, IUD fell out, del Dec, had [facility], NP to be in orders

## 2015-01-20 NOTE — MAU Provider Note (Signed)
History     CSN: [id number]  Arrival date and time: 01/20/15 1423   None     Chief Complaint  Patient presents with  . Vaginal Bleeding   HPI  Pt is not pregnant- had IUD inserted by Dr. Alwyn Pea- started bleeding yesterday and became heavier and IUD fell out. Pt continues bleeding and cramping- pt took Tylenol without relief. 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 Continuous since then, IUD fell out, del Dec, had [facility], NP to be in orders        Past Medical History  Diagnosis Date  . Anxiety   . Mood swings   . Hypertension   . Seizures     Last seizure 8/15  . Ankle fracture   . Blood transfusion without reported diagnosis   . Gastric bypass status for obesity     Past Surgical History  Procedure Laterality Date  . Gastric bypass    . Wisdom tooth extraction    . External fixation leg Left 06/10/2014    Procedure: EXTERNAL FIXATION LEFT PILON FRACTURE;  Surgeon: Rozanna Box, MD;  Location: [place];  Service: Orthopedics;  Laterality: Left;  . I&d extremity Left 06/10/2014    Procedure: IRRIGATION AND DEBRIDEMENT LEFT OPEN FRACTURE; INCLUDING BONE;  Surgeon: Rozanna Box, MD;  Location: [place];  Service: Orthopedics;  Laterality: Left;  . Ankle closed reduction Right 06/10/2014    Procedure: CLOSED REDUCTION AND SPLINTING RIGHT TALUS FRACTURE DISLOCATION;  Surgeon: Rozanna Box, MD;  Location: [place];  Service: Orthopedics;  Laterality: Right;  . Orif fibula fracture Left 06/10/2014    Procedure: OPEN REDUCTION INTERNAL FIXATION (ORIF) PILON FRACTURE, FIBULA ONLY;  Surgeon: Rozanna Box, MD;  Location: [place];  Service: Orthopedics;  Laterality: Left;  .  Orif ankle fracture Left 06/24/2014    Procedure: OPEN REDUCTION INTERNAL FIXATION (ORIF) TIBIA PILON  FRACTURE WITH REMOVAL OF EXTERNAL FIXATURE ;  Surgeon: Rozanna Box, MD;  Location: [place];  Service: Orthopedics;  Laterality: Left;  . Cast application Right 0/35/0093    Procedure: CAST APPLICATION;  Surgeon: Rozanna Box, MD;  Location: [place];  Service: Orthopedics;  Laterality: Right;    Family History  Problem Relation Age of Onset  . High blood pressure Mother   . High Cholesterol Mother   . Stroke Father   . High blood pressure Father     History  Substance Use Topics  . Smoking status: Former Smoker    Types: Cigarettes  . Smokeless tobacco: Never Used  . Alcohol Use: No    Allergies:  Allergies  Allergen Reactions  . Ibuprofen Other (See Comments)    Can't take due to gastric surgery.   . Tramadol Other (See Comments)    Can't take this medication with seizure medication.     Prescriptions prior to admission  Medication Sig Dispense Refill Last Dose  . divalproex (DEPAKOTE ER) 500 MG 24 hr tablet Take 4 tablets (2,000 mg total) by mouth daily. (Patient taking differently: Take 2,000 mg by mouth at bedtime. ) 120 tablet 4 10/24/2014 at 2200  . folic acid (FOLVITE) 1 MG tablet Take 1  mg by mouth daily.    10/24/2014 at Unknown time  . oxyCODONE-acetaminophen (PERCOCET/ROXICET) 5-325 MG per tablet Take 2 tablets by mouth every 4 (four) hours as needed (for pain scale equal to or greater than 7). 30 tablet 0   . Prenatal Vit-Fe Fumarate-FA (PRENATAL MULTIVITAMIN) TABS tablet Take 1 tablet by mouth daily.    10/24/2014 at Unknown time    Review of Systems  Constitutional: Negative for fever and chills.  Gastrointestinal: Positive for nausea and abdominal pain. Negative for vomiting, diarrhea and constipation.  Genitourinary: Negative for dysuria and urgency.   Physical Exam   Blood pressure 144/91, pulse 101, temperature 97.9 F (36.6 C), temperature source  Oral, resp. rate 18, SpO2 100 %, unknown if currently breastfeeding.  Physical Exam  Nursing note and vitals reviewed. Constitutional: She is oriented to person, place, and time. She appears well-developed and well-nourished. No distress.  HENT:  Head: Normocephalic.  Eyes: Pupils are equal, round, and reactive to light.  Neck: Normal range of motion. Neck supple.  Cardiovascular: Normal rate.   Respiratory: Effort normal.  GI: Soft. She exhibits no distension. There is tenderness. There is no rebound.  Genitourinary:  sm- mod bright red blood in vault; cervix closed, NT  Musculoskeletal: Normal range of motion.  Neurological: She is alert and oriented to person, place, and time.  Skin: Skin is warm and dry. There is pallor.  Psychiatric: She has a normal mood and affect.    MAU Course  Procedures Discussed with Dr. Philis Pique 2 BCP ordered, LR and CBC and pelvic ultrasound MDM Results for orders placed or performed during the hospital encounter of 01/20/15 (from the past 24 hour(s))  CBC with Differential     Status: Abnormal   Collection Time: 01/20/15  4:45 PM  Result Value Ref Range   WBC 5.6 4.0 - 10.5 K/uL   RBC 3.65 (L) 3.87 - 5.11 MIL/uL   Hemoglobin 8.8 (L) 12.0 - 15.0 g/dL   HCT 28.5 (L) 36.0 - 46.0 %   MCV 78.1 78.0 - 100.0 fL   MCH 24.1 (L) 26.0 - 34.0 pg   MCHC 30.9 30.0 - 36.0 g/dL   RDW 19.5 (H) 11.5 - 15.5 %   Platelets 243 150 - 400 K/uL   Neutrophils Relative % 65 43 - 77 %   Neutro Abs 3.6 1.7 - 7.7 K/uL   Lymphocytes Relative 24 12 - 46 %   Lymphs Abs 1.4 0.7 - 4.0 K/uL   Monocytes Relative 7 3 - 12 %   Monocytes Absolute 0.4 0.1 - 1.0 K/uL   Eosinophils Relative 3 0 - 5 %   Eosinophils Absolute 0.2 0.0 - 0.7 K/uL   Basophils Relative 1 0 - 1 %   Basophils Absolute 0.0 0.0 - 0.1 K/uL  US Transvaginal Non-ob  01/20/2015   CLINICAL DATA:  Vaginal bleeding, mild postpartum from December of 2015  EXAM: TRANSABDOMINAL AND TRANSVAGINAL ULTRASOUND OF PELVIS   TECHNIQUE: Both transabdominal and transvaginal ultrasound examinations of the pelvis were performed. Transabdominal technique was performed for global imaging of the pelvis including uterus, ovaries, adnexal regions, and pelvic cul-de-sac. It was necessary to proceed with endovaginal exam following the transabdominal exam to visualize the ovaries and uterus.  COMPARISON:  None  FINDINGS: Uterus  Measurements: 9.1 x 4.4 x 5.2 cm. Changes consistent with an arcuate uterus are noted.  Endometrium  Thickness: 12 mm. Mobile echogenicity is noted within the endometrium suggestive of active bleeding  Right ovary  Measurements: 2.7 x 1.9 x 1.9 cm. Normal appearance/no adnexal mass.  Left ovary  Measurements: 2.9 x 2.1 x 1.7 cm. Normal appearance/no adnexal mass.  Other findings  No free fluid.  IMPRESSION: Arcuate uterus.  Prominent endometrium with changes consistent with active hemorrhage. Correlation with patient's current menstrual cycle is recommended.   Electronically Signed   By: Inez Catalina M.D.   On: 01/20/2015 18:29   US Pelvis Complete  01/20/2015   CLINICAL DATA:  Vaginal bleeding, mild postpartum from December of 2015  EXAM: TRANSABDOMINAL AND TRANSVAGINAL ULTRASOUND OF PELVIS  TECHNIQUE: Both transabdominal and transvaginal ultrasound examinations of the pelvis were performed. Transabdominal technique was performed for global imaging of the pelvis including uterus, ovaries, adnexal regions, and pelvic cul-de-sac. It was necessary to proceed with endovaginal exam following the transabdominal exam to visualize the ovaries and uterus.  COMPARISON:  None  FINDINGS: Uterus  Measurements: 9.1 x 4.4 x 5.2 cm. Changes consistent with an arcuate uterus are noted.  Endometrium  Thickness: 12 mm. Mobile echogenicity is noted within the endometrium suggestive of active bleeding  Right ovary  Measurements: 2.7 x 1.9 x 1.9 cm. Normal appearance/no adnexal mass.  Left ovary  Measurements: 2.9 x 2.1 x 1.7 cm. Normal  appearance/no adnexal mass.  Other findings  No free fluid.  IMPRESSION: Arcuate uterus.  Prominent endometrium with changes consistent with active hemorrhage. Correlation with patient's current menstrual cycle is recommended.   Electronically Signed   By: Inez Catalina M.D.   On: 01/20/2015 18:29  discussed with Dr.Lavanya Roa 1 L LR given Dilaudid .5mg  IV given for pain Zofran 4mg  IV given for nausea Assessment and Plan  Heavy vaginal bleeding- OCs 2 daily for 2 more days then 1 daily Rx Percocet F/U in office in 2 weeks unless heavy bleeding  Anemia- iron supplement  LINEBERRY,SUSAN 01/20/2015, 4:47 PM

## 2015-01-20 NOTE — MAU Note (Signed)
Urine in lab 

## 2015-01-20 NOTE — MAU Note (Signed)
Return from [place], states bleeding continued during [place] and now resting on side with pain better

## 2015-01-28 ENCOUNTER — Emergency Department (HOSPITAL_COMMUNITY)
Admission: EM | Admit: 2015-01-28 | Discharge: 2015-01-28 | Disposition: A | Discharge status: Home / Self Care | Payer: Medicaid Other | Attending: Emergency Medicine | Admitting: Emergency Medicine

## 2015-01-28 ENCOUNTER — Emergency Department (HOSPITAL_COMMUNITY): Payer: Medicaid Other

## 2015-01-28 ENCOUNTER — Encounter (HOSPITAL_COMMUNITY): Payer: Self-pay

## 2015-01-28 DIAGNOSIS — M25472 Effusion, left ankle: Secondary | ICD-10-CM

## 2015-01-28 DIAGNOSIS — Z87891 Personal history of nicotine dependence: Secondary | ICD-10-CM | POA: Insufficient documentation

## 2015-01-28 DIAGNOSIS — Z79899 Other long term (current) drug therapy: Secondary | ICD-10-CM | POA: Insufficient documentation

## 2015-01-28 DIAGNOSIS — Z8781 Personal history of (healed) traumatic fracture: Secondary | ICD-10-CM | POA: Diagnosis not present

## 2015-01-28 DIAGNOSIS — G40909 Epilepsy, unspecified, not intractable, without status epilepticus: Secondary | ICD-10-CM | POA: Diagnosis not present

## 2015-01-28 DIAGNOSIS — Z8659 Personal history of other mental and behavioral disorders: Secondary | ICD-10-CM | POA: Insufficient documentation

## 2015-01-28 DIAGNOSIS — M25572 Pain in left ankle and joints of left foot: Secondary | ICD-10-CM | POA: Diagnosis not present

## 2015-01-28 DIAGNOSIS — M79605 Pain in left leg: Secondary | ICD-10-CM | POA: Diagnosis present

## 2015-01-28 DIAGNOSIS — R2242 Localized swelling, mass and lump, left lower limb: Secondary | ICD-10-CM | POA: Insufficient documentation

## 2015-01-28 DIAGNOSIS — Z8639 Personal history of other endocrine, nutritional and metabolic disease: Secondary | ICD-10-CM | POA: Insufficient documentation

## 2015-01-28 DIAGNOSIS — R52 Pain, unspecified: Secondary | ICD-10-CM

## 2015-01-28 DIAGNOSIS — I10 Essential (primary) hypertension: Secondary | ICD-10-CM | POA: Diagnosis not present

## 2015-01-28 MED ORDER — HYDROCODONE-ACETAMINOPHEN 5-325 MG PO TABS
1.0000 | ORAL_TABLET | Freq: Once | ORAL | Status: AC
  Administered 2015-01-28: 1 via ORAL
  Filled 2015-01-28: qty 1

## 2015-01-28 MED ORDER — HYDROCODONE-ACETAMINOPHEN 5-325 MG PO TABS: 1.0000 | ORAL_TABLET | Freq: Four times a day (QID) | ORAL | Status: DC | PRN
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
## 2015-01-28 NOTE — ED Notes (Signed)
Pt is getting dressed into gown.
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
## 2015-01-28 NOTE — Discharge Instructions (Signed)
It was our pleasure to provide your ER care today - we hope that you feel better.  Elevate your legs periodically throughout the day to help with swelling and pain.  You may take hydrocodone as need for pain. No driving when taking hydrocodone. Also, do not take tylenol or acetaminophen containing medication when taking hydrocodone.  Follow up with your doctor/orthopedist in the next couple weeks.  Return to ER if worse, new symptoms, fevers, redness, severe pain and/or swelling, other concern.  You were given pain medication in the ER - no driving for the next 4 hours.    Peripheral Edema You have swelling in your legs (peripheral edema). This swelling is due to excess accumulation of salt and water in your body. Edema may be a sign of heart, kidney or liver disease, or a side effect of a medication. It may also be due to problems in the leg veins. Elevating your legs and using special support stockings may be very helpful, if the cause of the swelling is due to poor venous circulation. Avoid long periods of standing, whatever the cause. Treatment of edema depends on identifying the cause. Chips, pretzels, pickles and other salty foods should be avoided. Restricting salt in your diet is almost always needed. Water pills (diuretics) are often used to remove the excess salt and water from your body via urine. These medicines prevent the kidney from reabsorbing sodium. This increases urine flow. Diuretic treatment may also result in lowering of potassium levels in your body. Potassium supplements may be needed if you have to use diuretics daily. Daily weights can help you keep track of your progress in clearing your edema. You should call your caregiver for follow up care as recommended. SEEK IMMEDIATE MEDICAL CARE IF:   You have increased swelling, pain, redness, or heat in your legs.  You develop shortness of breath, especially when lying down.  You develop chest or abdominal pain, weakness,  or fainting.  You have a fever. Document Released: 11/22/2004 Document Revised: 01/07/2012 Document Reviewed: 11/02/2009 [facility] Patient Information 2015 [place], [place]. This information is not intended to replace advice given to you by your health care provider. Make sure you discuss any questions you have with your health care provider.
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
## 2015-01-28 NOTE — ED Notes (Signed)
Pt is in stable condition upon d/c and ambulates from ED. 

## 2015-02-09 ENCOUNTER — Other Ambulatory Visit: Payer: Self-pay | Admitting: Nurse Practitioner

## 2015-02-09 NOTE — Telephone Encounter (Signed)
No showed last appt on 01/28

## 2015-02-14 ENCOUNTER — Telehealth: Payer: Self-pay | Admitting: Nurse Practitioner

## 2015-02-14 NOTE — Telephone Encounter (Signed)
Refill sent to last until appt.  I called back to advise.  She is aware.

## 2015-02-14 NOTE — Telephone Encounter (Signed)
Patient called wanting to know if she can have a refill for her divalproex (DEPAKOTE ER) 500 MG 24 hr tablet. Patient is scheduled to see Carolyn/NP on 02/17/15. Please call and advice. Call back [phone]

## 2015-02-17 ENCOUNTER — Ambulatory Visit (INDEPENDENT_AMBULATORY_CARE_PROVIDER_SITE_OTHER): Payer: Medicaid Other | Admitting: Nurse Practitioner

## 2015-02-17 ENCOUNTER — Encounter: Payer: Self-pay | Admitting: Nurse Practitioner

## 2015-02-17 VITALS — BP 148/98 | HR 92 | Ht 64.0 in | Wt 161.0 lb

## 2015-02-17 DIAGNOSIS — Z5181 Encounter for therapeutic drug level monitoring: Secondary | ICD-10-CM

## 2015-02-17 DIAGNOSIS — G40909 Epilepsy, unspecified, not intractable, without status epilepticus: Secondary | ICD-10-CM | POA: Diagnosis not present

## 2015-02-17 NOTE — Patient Instructions (Signed)
Will check Depakote level today Continue Depakote at current dose until labs are back Follow-up in 6 months Call for any seizure activity
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
## 2015-02-18 ENCOUNTER — Telehealth: Payer: Self-pay

## 2015-02-18 ENCOUNTER — Other Ambulatory Visit: Payer: Self-pay | Admitting: Nurse Practitioner

## 2015-02-18 LAB — VALPROIC ACID LEVEL: Valproic Acid Lvl: 55 ug/mL (ref 50–100)

## 2015-02-18 MED ORDER — DIVALPROEX SODIUM ER 500 MG PO TB24: ORAL_TABLET | ORAL | Status: DC

## 2015-02-18 NOTE — Telephone Encounter (Signed)
-----   Message from Otilio Jefferson, NP sent at 02/18/2015  9:27 AM EDT ----- Good level of Depakote continue same dose. Please call the patient

## 2015-02-18 NOTE — Progress Notes (Signed)
I have reviewed and agreed above plan. 

## 2015-02-22 NOTE — Telephone Encounter (Signed)
Spoke to patient. Gave lab results. Patient verbalized understanding.  

## 2015-05-15 ENCOUNTER — Emergency Department (HOSPITAL_COMMUNITY)
Admission: EM | Admit: 2015-05-15 | Discharge: 2015-05-15 | Disposition: A | Discharge status: Home / Self Care | Payer: Medicaid Other | Attending: Emergency Medicine | Admitting: Emergency Medicine

## 2015-05-15 ENCOUNTER — Encounter (HOSPITAL_COMMUNITY): Payer: Self-pay | Admitting: *Deleted

## 2015-05-15 DIAGNOSIS — Z8659 Personal history of other mental and behavioral disorders: Secondary | ICD-10-CM | POA: Diagnosis not present

## 2015-05-15 DIAGNOSIS — K088 Other specified disorders of teeth and supporting structures: Secondary | ICD-10-CM | POA: Insufficient documentation

## 2015-05-15 DIAGNOSIS — G40909 Epilepsy, unspecified, not intractable, without status epilepticus: Secondary | ICD-10-CM | POA: Insufficient documentation

## 2015-05-15 DIAGNOSIS — Z8781 Personal history of (healed) traumatic fracture: Secondary | ICD-10-CM | POA: Diagnosis not present

## 2015-05-15 DIAGNOSIS — I10 Essential (primary) hypertension: Secondary | ICD-10-CM | POA: Diagnosis not present

## 2015-05-15 DIAGNOSIS — K0889 Other specified disorders of teeth and supporting structures: Secondary | ICD-10-CM

## 2015-05-15 DIAGNOSIS — K029 Dental caries, unspecified: Secondary | ICD-10-CM | POA: Insufficient documentation

## 2015-05-15 DIAGNOSIS — Z79899 Other long term (current) drug therapy: Secondary | ICD-10-CM | POA: Insufficient documentation

## 2015-05-15 DIAGNOSIS — Z9884 Bariatric surgery status: Secondary | ICD-10-CM | POA: Insufficient documentation

## 2015-05-15 DIAGNOSIS — Z792 Long term (current) use of antibiotics: Secondary | ICD-10-CM | POA: Diagnosis not present

## 2015-05-15 DIAGNOSIS — Z87891 Personal history of nicotine dependence: Secondary | ICD-10-CM | POA: Insufficient documentation

## 2015-05-15 MED ORDER — ONDANSETRON 4 MG PO TBDP
4.0000 mg | ORAL_TABLET | Freq: Once | ORAL | Status: AC
  Administered 2015-05-15: 4 mg via ORAL
  Filled 2015-05-15: qty 1

## 2015-05-15 MED ORDER — HYDROCODONE-ACETAMINOPHEN 5-325 MG PO TABS
1.0000 | ORAL_TABLET | Freq: Once | ORAL | Status: AC
  Administered 2015-05-15: 1 via ORAL
  Filled 2015-05-15: qty 1

## 2015-05-15 MED ORDER — HYDROCODONE-ACETAMINOPHEN 5-325 MG PO TABS: 1.0000 | ORAL_TABLET | ORAL | Status: DC | PRN

## 2015-05-15 MED ORDER — AMOXICILLIN 500 MG PO CAPS: 500.0000 mg | ORAL_CAPSULE | Freq: Three times a day (TID) | ORAL | Status: DC

## 2015-05-15 NOTE — ED Notes (Signed)
Pt reports broken left lower tooth causing pain. No distress noted at triage.

## 2015-05-15 NOTE — Discharge Instructions (Signed)

## 2015-05-15 NOTE — ED Provider Notes (Signed)
CSN: [id number]     Arrival date & time 05/15/15  1416 History  This chart was scribed for Isabella Haring, PA-C, working with [name], MD by Steva Colder, ED Scribe. The patient was seen in room TR07C/TR07C at 3:38 PM.    Chief Complaint  Patient presents with  . Dental Pain      The history is provided by the patient. No language interpreter was used.    HPI Comments: Isabella Jenkins is a 39 y.o. female with a medical hx of HTN who presents to the Emergency Department complaining of left lower dental pain onset this morning PTA. Pt reports that she has a broken left lower tooth that is causing her pain. Pt reports that the tooth doesnt typically cause her pain but this morning she bit on something and there was a sharp pain. Pt has a dentist that she is able to f/u with. She states that she has tried tylenol with no relief for her symptoms. She denies fever, n/v/d, trismus, drooling, and any other symptoms.   Past Medical History  Diagnosis Date  . Anxiety   . Mood swings   . Hypertension   . Seizures     Last seizure 8/15  . Ankle fracture   . Blood transfusion without reported diagnosis   . Gastric bypass status for obesity    Past Surgical History  Procedure Laterality Date  . Gastric bypass    . Wisdom tooth extraction    . External fixation leg Left 06/10/2014    Procedure: EXTERNAL FIXATION LEFT PILON FRACTURE;  Surgeon: Rozanna Box, MD;  Location: [place];  Service: Orthopedics;  Laterality: Left;  . I&d extremity Left 06/10/2014    Procedure: IRRIGATION AND DEBRIDEMENT LEFT OPEN FRACTURE; INCLUDING BONE;  Surgeon: Rozanna Box, MD;  Location: [place];  Service: Orthopedics;  Laterality: Left;  . Ankle closed reduction Right 06/10/2014    Procedure: CLOSED REDUCTION AND SPLINTING RIGHT TALUS FRACTURE DISLOCATION;  Surgeon: Rozanna Box, MD;  Location: [place];  Service: Orthopedics;  Laterality: Right;  . Orif fibula fracture Left 06/10/2014    Procedure: OPEN  REDUCTION INTERNAL FIXATION (ORIF) PILON FRACTURE, FIBULA ONLY;  Surgeon: Rozanna Box, MD;  Location: [place];  Service: Orthopedics;  Laterality: Left;  . Orif ankle fracture Left 06/24/2014    Procedure: OPEN REDUCTION INTERNAL FIXATION (ORIF) TIBIA PILON  FRACTURE WITH REMOVAL OF EXTERNAL FIXATURE ;  Surgeon: Rozanna Box, MD;  Location: [place];  Service: Orthopedics;  Laterality: Left;  . Cast application Right 3/50/0938    Procedure: CAST APPLICATION;  Surgeon: Rozanna Box, MD;  Location: [place];  Service: Orthopedics;  Laterality: Right;   Family History  Problem Relation Age of Onset  . High blood pressure Mother   . High Cholesterol Mother   . Stroke Father   . High blood pressure Father    History  Substance Use Topics  . Smoking status: Former Smoker    Types: Cigarettes  . Smokeless tobacco: Never Used  . Alcohol Use: No   OB History    Gravida Para Term Preterm AB TAB SAB Ectopic Multiple Living   3 3 3       0 1     Review of Systems  Constitutional: Negative for fever and chills.  HENT: Positive for dental problem.   Gastrointestinal: Negative for nausea and vomiting.    Allergies  Ibuprofen and Tramadol  Home Medications   Prior to Admission medications  Medication Sig Start Date End Date Taking? Authorizing Provider  acetaminophen (TYLENOL) 325 MG tablet Take 650 mg by mouth every 6 (six) hours as needed for mild pain.    Historical Provider, MD  amoxicillin (AMOXIL) 500 MG capsule Take 1 capsule (500 mg total) by mouth 3 (three) times daily. 05/15/15   Macarthur Lorusso Carlota Raspberry, PA-C  divalproex (DEPAKOTE ER) 500 MG 24 hr tablet TAKE 4 TABLETS (2,000 MG TOTAL) BY MOUTH DAILY. 02/18/15   Dennie Bible, NP  HYDROcodone-acetaminophen (NORCO/VICODIN) 5-325 MG per tablet Take 1-2 tablets by mouth every 6 (six) hours as needed for moderate pain. Patient not taking: Reported on 02/17/2015 01/28/15   Lajean Saver, MD  HYDROcodone-acetaminophen (NORCO/VICODIN) 5-325 MG  per tablet Take 1-2 tablets by mouth every 4 (four) hours as needed. 05/15/15   Kathlee Barnhardt Carlota Raspberry, PA-C  nitrofurantoin, macrocrystal-monohydrate, (MACROBID) 100 MG capsule Take 1 capsule by mouth daily. 12/13/14   Historical Provider, MD   BP 125/80 mmHg  Pulse 102  Temp(Src) 98.5 F (36.9 C) (Oral)  Resp 16  Ht 5\' 4"  (1.626 m)  Wt 160 lb (72.576 kg)  BMI 27.45 kg/m2  SpO2 100%  LMP 05/05/2015 Physical Exam  Constitutional: She is oriented to person, place, and time. She appears well-developed and well-nourished. No distress.  HENT:  Head: Normocephalic and atraumatic.  Mouth/Throat: Uvula is midline, oropharynx is clear and moist and mucous membranes are normal. No oral lesions. No trismus in the jaw. Normal dentition. Dental caries (Pts tooth shows no obvious abscess but moderate to severe tenderness to palpation of marked tooth) present. No dental abscesses, uvula swelling or lacerations.    Eyes: EOM are normal. Pupils are equal, round, and reactive to light.  Neck: Trachea normal, normal range of motion and full passive range of motion without pain. Neck supple. No tracheal deviation present.  Cardiovascular: Normal rate, regular rhythm, normal heart sounds and normal pulses.   Pulmonary/Chest: Effort normal and breath sounds normal. No respiratory distress. Chest wall is not dull to percussion. She exhibits no tenderness, no crepitus, no edema, no deformity and no retraction.  Abdominal: Normal appearance.  Musculoskeletal: Normal range of motion.  Neurological: She is alert and oriented to person, place, and time. She has normal strength.  Skin: Skin is warm, dry and intact. She is not diaphoretic.  Psychiatric: She has a normal mood and affect. Her speech is normal and behavior is normal. Cognition and memory are normal.  Nursing note and vitals reviewed.   ED Course  Procedures (including critical care time) DIAGNOSTIC STUDIES: Oxygen Saturation is 100% on RA, nl by my  interpretation.    COORDINATION OF CARE: 3:39 PM-Discussed treatment plan with pt at bedside and pt agreed to plan.   Labs Review Labs Reviewed - No data to display  Imaging Review No results found.   EKG Interpretation None      MDM   Final diagnoses:  Toothache    No emergent s/sx's present. Patent airway. No trismus.  No neck tenderness or protrusion of tongue or floor of mouth.  Medications  HYDROcodone-acetaminophen (NORCO/VICODIN) 5-325 MG per tablet 1 tablet (not administered)  ondansetron (ZOFRAN-ODT) disintegrating tablet 4 mg (not administered)    39 y.o.[facility]'s evaluation in the Emergency Department is complete. It has been determined that no acute conditions requiring further emergency intervention are present at this time. The patient/guardian have been advised of the diagnosis and plan. We have discussed signs and symptoms that warrant return to the ED, such as  changes or worsening in symptoms.  Vital signs are stable at discharge. Filed Vitals:   05/15/15 1420  BP: 125/80  Pulse: 102  Temp: 98.5 F (36.9 C)  Resp: 16    Patient/guardian has voiced understanding and agreed to follow-up with the PCP or specialist. I personally performed the services described in this documentation, which was scribed in my presence. The recorded information has been reviewed and is accurate.     Isabella Haring, PA-C 05/15/15 Arlee Yao, MD 05/15/15 [phone]

## 2015-05-15 NOTE — ED Notes (Signed)
Patient is alert and orientedx4.  Patient was explained discharge instructions and they understood them with no questions.   

## 2015-05-17 ENCOUNTER — Emergency Department (HOSPITAL_COMMUNITY)
Admission: EM | Admit: 2015-05-17 | Discharge: 2015-05-17 | Disposition: A | Discharge status: Home / Self Care | Payer: Medicaid Other | Attending: Emergency Medicine | Admitting: Emergency Medicine

## 2015-05-17 ENCOUNTER — Encounter (HOSPITAL_COMMUNITY): Payer: Self-pay

## 2015-05-17 DIAGNOSIS — Z79899 Other long term (current) drug therapy: Secondary | ICD-10-CM | POA: Diagnosis not present

## 2015-05-17 DIAGNOSIS — Z792 Long term (current) use of antibiotics: Secondary | ICD-10-CM | POA: Insufficient documentation

## 2015-05-17 DIAGNOSIS — Z87891 Personal history of nicotine dependence: Secondary | ICD-10-CM | POA: Diagnosis not present

## 2015-05-17 DIAGNOSIS — Z8659 Personal history of other mental and behavioral disorders: Secondary | ICD-10-CM | POA: Diagnosis not present

## 2015-05-17 DIAGNOSIS — G40909 Epilepsy, unspecified, not intractable, without status epilepticus: Secondary | ICD-10-CM | POA: Diagnosis not present

## 2015-05-17 DIAGNOSIS — K088 Other specified disorders of teeth and supporting structures: Secondary | ICD-10-CM | POA: Diagnosis present

## 2015-05-17 DIAGNOSIS — Z8781 Personal history of (healed) traumatic fracture: Secondary | ICD-10-CM | POA: Insufficient documentation

## 2015-05-17 DIAGNOSIS — K0889 Other specified disorders of teeth and supporting structures: Secondary | ICD-10-CM

## 2015-05-17 DIAGNOSIS — Z9884 Bariatric surgery status: Secondary | ICD-10-CM | POA: Insufficient documentation

## 2015-05-17 DIAGNOSIS — I10 Essential (primary) hypertension: Secondary | ICD-10-CM | POA: Diagnosis not present

## 2015-05-17 DIAGNOSIS — K029 Dental caries, unspecified: Secondary | ICD-10-CM | POA: Diagnosis not present

## 2015-05-17 MED ORDER — BUPIVACAINE-EPINEPHRINE (PF) 0.5% -1:200000 IJ SOLN
1.8000 mL | Freq: Once | INTRAMUSCULAR | Status: AC
  Administered 2015-05-17: 1.8 mL
  Filled 2015-05-17: qty 1.8

## 2015-05-17 NOTE — ED Provider Notes (Signed)
CSN: [id number]     Arrival date & time 05/17/15  1551 History  This chart was scribed for Comer Locket, PA-C, working with Pattricia Boss, MD by Steva Colder, ED Scribe. The patient was seen in room TR08C/TR08C at 4:27 PM.    Chief Complaint  Patient presents with  . Dental Pain      The history is provided by the patient. No language interpreter was used.    Isabella Jenkins is a 39 y.o. female with a medical hx of HTN who presents to the Emergency Department complaining of left lower dental pain onset 4 days. Pt was seen in the ED on 05/15/15 with similar symptoms and she has an appointment with a dentist that isn't until tomorrow. Pt notes that she was Rx abx and pain medications and she has ran out of the pain medications. Pt was informed by her dentist to come into the ED for more pain medications until her appointment tomorrow. She denies any new problems with her dental pain. She notes that she has tried tylenol with no relief of her symptoms. She denies fever, chills, color change, wound and any other symptoms. Pt is allergic to ibuprofen and tramadol. Pt reports that she had stomach surgery in 2010 and she cannot take ibuprofen.   Past Medical History  Diagnosis Date  . Anxiety   . Mood swings   . Hypertension   . Seizures     Last seizure 8/15  . Ankle fracture   . Blood transfusion without reported diagnosis   . Gastric bypass status for obesity    Past Surgical History  Procedure Laterality Date  . Gastric bypass    . Wisdom tooth extraction    . External fixation leg Left 06/10/2014    Procedure: EXTERNAL FIXATION LEFT PILON FRACTURE;  Surgeon: Rozanna Box, MD;  Location: [place];  Service: Orthopedics;  Laterality: Left;  . I&d extremity Left 06/10/2014    Procedure: IRRIGATION AND DEBRIDEMENT LEFT OPEN FRACTURE; INCLUDING BONE;  Surgeon: Rozanna Box, MD;  Location: [place];  Service: Orthopedics;  Laterality: Left;  . Ankle closed reduction Right 06/10/2014     Procedure: CLOSED REDUCTION AND SPLINTING RIGHT TALUS FRACTURE DISLOCATION;  Surgeon: Rozanna Box, MD;  Location: [place];  Service: Orthopedics;  Laterality: Right;  . Orif fibula fracture Left 06/10/2014    Procedure: OPEN REDUCTION INTERNAL FIXATION (ORIF) PILON FRACTURE, FIBULA ONLY;  Surgeon: Rozanna Box, MD;  Location: [place];  Service: Orthopedics;  Laterality: Left;  . Orif ankle fracture Left 06/24/2014    Procedure: OPEN REDUCTION INTERNAL FIXATION (ORIF) TIBIA PILON  FRACTURE WITH REMOVAL OF EXTERNAL FIXATURE ;  Surgeon: Rozanna Box, MD;  Location: [place];  Service: Orthopedics;  Laterality: Left;  . Cast application Right 6/44/0347    Procedure: CAST APPLICATION;  Surgeon: Rozanna Box, MD;  Location: [place];  Service: Orthopedics;  Laterality: Right;   Family History  Problem Relation Age of Onset  . High blood pressure Mother   . High Cholesterol Mother   . Stroke Father   . High blood pressure Father    History  Substance Use Topics  . Smoking status: Former Smoker    Types: Cigarettes  . Smokeless tobacco: Never Used  . Alcohol Use: No   OB History    Gravida Para Term Preterm AB TAB SAB Ectopic Multiple Living   3 3 3       0 1     Review of  Systems  Constitutional: Negative for fever and chills.  HENT: Positive for dental problem.   Skin: Negative for color change and wound.      Allergies  Ibuprofen and Tramadol  Home Medications   Prior to Admission medications   Medication Sig Start Date End Date Taking? Authorizing Provider  acetaminophen (TYLENOL) 325 MG tablet Take 650 mg by mouth every 6 (six) hours as needed for mild pain.    Historical Provider, MD  amoxicillin (AMOXIL) 500 MG capsule Take 1 capsule (500 mg total) by mouth 3 (three) times daily. 05/15/15   Tiffany Carlota Raspberry, PA-C  divalproex (DEPAKOTE ER) 500 MG 24 hr tablet TAKE 4 TABLETS (2,000 MG TOTAL) BY MOUTH DAILY. 02/18/15   Dennie Bible, NP  HYDROcodone-acetaminophen  (NORCO/VICODIN) 5-325 MG per tablet Take 1-2 tablets by mouth every 6 (six) hours as needed for moderate pain. Patient not taking: Reported on 02/17/2015 01/28/15   Lajean Saver, MD  HYDROcodone-acetaminophen (NORCO/VICODIN) 5-325 MG per tablet Take 1-2 tablets by mouth every 4 (four) hours as needed. 05/15/15   Tiffany Carlota Raspberry, PA-C  nitrofurantoin, macrocrystal-monohydrate, (MACROBID) 100 MG capsule Take 1 capsule by mouth daily. 12/13/14   Historical Provider, MD   BP 143/94 mmHg  Pulse 104  Temp(Src) 98.7 F (37.1 C) (Oral)  Resp 20  SpO2 100%  LMP 05/05/2015 Physical Exam  Constitutional: She is oriented to person, place, and time. She appears well-developed and well-nourished. No distress.  HENT:  Head: Normocephalic and atraumatic.  Discomfort located to the left mandibular premolar and molars. Overall poor dentition with active decay at tooth base. Mucous membranes are moist. No unilateral tonsillar swelling, uvula midline, no glossal swelling or elevation. No trismus. No fluctuance or evidence of a drainable abscess. No other evidence of emergent infection, Retropharyngeal or Peritonsillar abscess, Ludwig or Vincents angina. Tolerating secretions well. Patent airway   Eyes: EOM are normal.  Neck: Neck supple. No tracheal deviation present.  Cardiovascular: Normal rate.   Pulmonary/Chest: Effort normal. No respiratory distress.  Musculoskeletal: Normal range of motion.  Neurological: She is alert and oriented to person, place, and time.  Skin: Skin is warm and dry.  Psychiatric: She has a normal mood and affect. Her behavior is normal.  Nursing note and vitals reviewed.   ED Course  Procedures (including critical care time) NERVE BLOCK Performed by: Verl Dicker Consent: Verbal consent obtained. Required items: required blood products, implants, devices, and special equipment available Time out: Immediately prior to procedure a "time out" was called to verify the correct  patient, procedure, equipment, support staff and site/side marked as required.  Indication: Dental pain  Nerve block body site: Inferior alveolar   Preparation: Patient was prepped and draped in the usual sterile fashion. Needle gauge: 24 G Location technique: anatomical landmarks  Local anesthetic: Bupivacaine   Anesthetic total: 1.8 ml  Outcome: pain improved Patient tolerance: Patient tolerated the procedure well with no immediate complications.  DIAGNOSTIC STUDIES: Oxygen Saturation is 100% on RA, nl by my interpretation.    COORDINATION OF CARE: 4:29 PM-Discussed treatment plan which includes dental block with pt at bedside and pt agreed to plan.   Labs Review Labs Reviewed - No data to display  Imaging Review No results found.   EKG Interpretation None     Filed Vitals:   05/17/15 1559  BP: 143/94  Pulse: 104  Temp: 98.7 F (37.1 C)  TempSrc: Oral  Resp: 20  SpO2: 100%    MDM  Vitals stable -  afebrile Pt resting comfortably in ED.  pain resolved after oral nerve block. PE--physical exam is not concerning. No evidence of retropharyngeal, peritonsillar abscess or Ludwig angina. Patent airway with no trismus. Appropriate phonation.  Discussed follow-up with dentist for her regularly scheduled appointment on Thursday. Discussed will not be refilling narcotic pain medicine. Patient refuses Tylenol and other anti-inflammatory's. Encouraged her to continue taking antibiotics as prescribed.  I discussed all relevant lab findings and imaging results with pt and they verbalized understanding. Discussed f/u with PCP within 48 hrs and return precautions, pt very amenable to plan.  Final diagnoses:  Pain, dental   I personally performed the services described in this documentation, which was scribed in my presence. The recorded information has been reviewed and is accurate.    Comer Locket, PA-C 05/17/15 [address], PA-C 05/17/15  1710  Pattricia Boss, MD 05/19/15 [phone]

## 2015-05-17 NOTE — Discharge Instructions (Signed)
Please follow-up with your dentist for definitive dental care.  Dental Pain A tooth ache may be caused by cavities (tooth decay). Cavities expose the nerve of the tooth to air and hot or cold temperatures. It may come from an infection or abscess (also called a boil or furuncle) around your tooth. It is also often caused by dental caries (tooth decay). This causes the pain you are having. DIAGNOSIS  Your caregiver can diagnose this problem by exam. TREATMENT   If caused by an infection, it may be treated with medications which kill germs (antibiotics) and pain medications as prescribed by your caregiver. Take medications as directed.  Only take over-the-counter or prescription medicines for pain, discomfort, or fever as directed by your caregiver.  Whether the tooth ache today is caused by infection or dental disease, you should see your dentist as soon as possible for further care. SEEK MEDICAL CARE IF: The exam and treatment you received today has been provided on an emergency basis only. This is not a substitute for complete medical or dental care. If your problem worsens or new problems (symptoms) appear, and you are unable to meet with your dentist, call or return to this location. SEEK IMMEDIATE MEDICAL CARE IF:   You have a fever.  You develop redness and swelling of your face, jaw, or neck.  You are unable to open your mouth.  You have severe pain uncontrolled by pain medicine. MAKE SURE YOU:   Understand these instructions.  Will watch your condition.  Will get help right away if you are not doing well or get worse. Document Released: 10/15/2005 Document Revised: 01/07/2012 Document Reviewed: 06/02/2008 [facility] Patient Information 2015 [place], [place]. This information is not intended to replace advice given to you by your health care provider. Make sure you discuss any questions you have with your health care provider.

## 2015-05-17 NOTE — ED Notes (Signed)
Pt reports bottom left molar tooth ache that started Sunday. She was seen here Sunday and was prescribed pain and antibiotic. She states she ran out of her pain meds but is still taking her abx. The pain is getting worse but her dentist appt isnt until this Thursday. No facial swelling noted.

## 2015-05-24 ENCOUNTER — Emergency Department
Admission: EM | Admit: 2015-05-24 | Discharge: 2015-05-24 | Disposition: A | Discharge status: Home / Self Care | Payer: Medicaid Other | Attending: Emergency Medicine | Admitting: Emergency Medicine

## 2015-05-24 ENCOUNTER — Emergency Department: Payer: Medicaid Other

## 2015-05-24 ENCOUNTER — Encounter: Payer: Self-pay | Admitting: Emergency Medicine

## 2015-05-24 DIAGNOSIS — I10 Essential (primary) hypertension: Secondary | ICD-10-CM | POA: Diagnosis not present

## 2015-05-24 DIAGNOSIS — Y9389 Activity, other specified: Secondary | ICD-10-CM | POA: Diagnosis not present

## 2015-05-24 DIAGNOSIS — Y9289 Other specified places as the place of occurrence of the external cause: Secondary | ICD-10-CM | POA: Diagnosis not present

## 2015-05-24 DIAGNOSIS — Z87891 Personal history of nicotine dependence: Secondary | ICD-10-CM | POA: Insufficient documentation

## 2015-05-24 DIAGNOSIS — S99912A Unspecified injury of left ankle, initial encounter: Secondary | ICD-10-CM | POA: Diagnosis present

## 2015-05-24 DIAGNOSIS — W1842XA Slipping, tripping and stumbling without falling due to stepping into hole or opening, initial encounter: Secondary | ICD-10-CM | POA: Insufficient documentation

## 2015-05-24 DIAGNOSIS — F419 Anxiety disorder, unspecified: Secondary | ICD-10-CM | POA: Insufficient documentation

## 2015-05-24 DIAGNOSIS — Z79899 Other long term (current) drug therapy: Secondary | ICD-10-CM | POA: Insufficient documentation

## 2015-05-24 DIAGNOSIS — R609 Edema, unspecified: Secondary | ICD-10-CM | POA: Diagnosis not present

## 2015-05-24 DIAGNOSIS — Y998 Other external cause status: Secondary | ICD-10-CM | POA: Insufficient documentation

## 2015-05-24 DIAGNOSIS — S93402A Sprain of unspecified ligament of left ankle, initial encounter: Secondary | ICD-10-CM | POA: Diagnosis not present

## 2015-05-24 DIAGNOSIS — Z792 Long term (current) use of antibiotics: Secondary | ICD-10-CM | POA: Insufficient documentation

## 2015-05-24 MED ORDER — OXYCODONE-ACETAMINOPHEN 7.5-325 MG PO TABS: 1.0000 | ORAL_TABLET | Freq: Four times a day (QID) | ORAL | Status: DC | PRN
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
## 2015-05-24 NOTE — Discharge Instructions (Signed)
Wear ankle support for 3-5 days as needed.

## 2015-05-24 NOTE — ED Notes (Signed)
States she stepped in hole this am twisted left ankle

## 2015-06-05 ENCOUNTER — Encounter (HOSPITAL_COMMUNITY): Payer: Self-pay | Admitting: Emergency Medicine

## 2015-06-05 ENCOUNTER — Emergency Department (HOSPITAL_COMMUNITY)
Admission: EM | Admit: 2015-06-05 | Discharge: 2015-06-05 | Disposition: A | Discharge status: Home / Self Care | Payer: Medicaid Other | Attending: Emergency Medicine | Admitting: Emergency Medicine

## 2015-06-05 DIAGNOSIS — Y998 Other external cause status: Secondary | ICD-10-CM | POA: Insufficient documentation

## 2015-06-05 DIAGNOSIS — Z79899 Other long term (current) drug therapy: Secondary | ICD-10-CM | POA: Diagnosis not present

## 2015-06-05 DIAGNOSIS — S93402A Sprain of unspecified ligament of left ankle, initial encounter: Secondary | ICD-10-CM | POA: Diagnosis not present

## 2015-06-05 DIAGNOSIS — Z792 Long term (current) use of antibiotics: Secondary | ICD-10-CM | POA: Insufficient documentation

## 2015-06-05 DIAGNOSIS — Z9884 Bariatric surgery status: Secondary | ICD-10-CM | POA: Insufficient documentation

## 2015-06-05 DIAGNOSIS — Y9289 Other specified places as the place of occurrence of the external cause: Secondary | ICD-10-CM | POA: Insufficient documentation

## 2015-06-05 DIAGNOSIS — S93401A Sprain of unspecified ligament of right ankle, initial encounter: Secondary | ICD-10-CM | POA: Diagnosis not present

## 2015-06-05 DIAGNOSIS — Z8659 Personal history of other mental and behavioral disorders: Secondary | ICD-10-CM | POA: Insufficient documentation

## 2015-06-05 DIAGNOSIS — W1842XA Slipping, tripping and stumbling without falling due to stepping into hole or opening, initial encounter: Secondary | ICD-10-CM | POA: Diagnosis not present

## 2015-06-05 DIAGNOSIS — Z87891 Personal history of nicotine dependence: Secondary | ICD-10-CM | POA: Insufficient documentation

## 2015-06-05 DIAGNOSIS — S99912A Unspecified injury of left ankle, initial encounter: Secondary | ICD-10-CM | POA: Diagnosis present

## 2015-06-05 DIAGNOSIS — Y9389 Activity, other specified: Secondary | ICD-10-CM | POA: Diagnosis not present

## 2015-06-05 DIAGNOSIS — I10 Essential (primary) hypertension: Secondary | ICD-10-CM | POA: Insufficient documentation

## 2015-06-05 MED ORDER — HYDROCODONE-ACETAMINOPHEN 5-325 MG PO TABS
1.0000 | ORAL_TABLET | Freq: Four times a day (QID) | ORAL | Status: DC | PRN
Start: 2015-06-05 — End: 2015-07-03
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
## 2015-06-05 NOTE — Discharge Instructions (Signed)
norco for severe pain only. Ice and elevate your ankles. ACE wrap for swelling. Please follow up with Dr. Marcelino Scot for recheck.    Ankle Sprain An ankle sprain is an injury to the strong, fibrous tissues (ligaments) that hold the bones of your ankle joint together.  CAUSES An ankle sprain is usually caused by a fall or by twisting your ankle. Ankle sprains most commonly occur when you step on the outer edge of your foot, and your ankle turns inward. People who participate in sports are more prone to these types of injuries.  SYMPTOMS   Pain in your ankle. The pain may be present at rest or only when you are trying to stand or walk.  Swelling.  Bruising. Bruising may develop immediately or within 1 to 2 days after your injury.  Difficulty standing or walking, particularly when turning corners or changing directions. DIAGNOSIS  Your caregiver will ask you details about your injury and perform a physical exam of your ankle to determine if you have an ankle sprain. During the physical exam, your caregiver will press on and apply pressure to specific areas of your foot and ankle. Your caregiver will try to move your ankle in certain ways. An X-ray exam may be done to be sure a bone was not broken or a ligament did not separate from one of the bones in your ankle (avulsion fracture).  TREATMENT  Certain types of braces can help stabilize your ankle. Your caregiver can make a recommendation for this. Your caregiver may recommend the use of medicine for pain. If your sprain is severe, your caregiver may refer you to a surgeon who helps to restore function to parts of your skeletal system (orthopedist) or a physical therapist. [place] ice to your injury for 1-2 days or as directed by your caregiver. Applying ice helps to reduce inflammation and pain.  Put ice in a plastic bag.  Place a towel between your skin and the bag.  Leave the ice on for 15-20 minutes at a time, every 2  hours while you are awake.  Only take over-the-counter or prescription medicines for pain, discomfort, or fever as directed by your caregiver.  Elevate your injured ankle above the level of your heart as much as possible for 2-3 days.  If your caregiver recommends crutches, use them as instructed. Gradually put weight on the affected ankle. Continue to use crutches or a cane until you can walk without feeling pain in your ankle.  If you have a plaster splint, wear the splint as directed by your caregiver. Do not rest it on anything harder than a pillow for the first 24 hours. Do not put weight on it. Do not get it wet. You may take it off to take a shower or bath.  You may have been given an elastic bandage to wear around your ankle to provide support. If the elastic bandage is too tight (you have numbness or tingling in your foot or your foot becomes cold and blue), adjust the bandage to make it comfortable.  If you have an air splint, you may blow more air into it or let air out to make it more comfortable. You may take your splint off at night and before taking a shower or bath. Wiggle your toes in the splint several times per day to decrease swelling. SEEK MEDICAL CARE IF:   You have rapidly increasing bruising or swelling.  Your toes feel extremely cold or you  lose feeling in your foot.  Your pain is not relieved with medicine. SEEK IMMEDIATE MEDICAL CARE IF:  Your toes are numb or blue.  You have severe pain that is increasing. MAKE SURE YOU:   Understand these instructions.  Will watch your condition.  Will get help right away if you are not doing well or get worse. Document Released: 10/15/2005 Document Revised: 07/09/2012 Document Reviewed: 10/27/2011 [facility] Patient Information 2015 [place], [place]. This information is not intended to replace advice given to you by your health care provider. Make sure you discuss any questions you have with your health care provider.

## 2015-06-05 NOTE — ED Notes (Signed)
Pt reported bil ankle pain/injury a couple of weeks ago s/p to twisting after stepping in a hole. Pt ambulated to fast track room with steady gait. (+)PMS, CRT brisk, no deformity/bruising noted. Noted puffiness to rt ankle. Pt reported seeing PMD with x-ray done but does not know results.

## 2015-06-07 ENCOUNTER — Telehealth: Payer: Self-pay | Admitting: *Deleted

## 2015-06-07 NOTE — Telephone Encounter (Signed)
Form,DMV sent to [facility] and Hoyle Sauer 06/07/15.

## 2015-06-08 DIAGNOSIS — Z0289 Encounter for other administrative examinations: Secondary | ICD-10-CM

## 2015-06-08 NOTE — Telephone Encounter (Signed)
To CM/NP for review and signature.

## 2015-06-09 NOTE — Telephone Encounter (Signed)
Signed.  To MR [place] Breeze.)

## 2015-06-09 NOTE — Telephone Encounter (Signed)
Patient is returning phone call.  °

## 2015-06-10 NOTE — Telephone Encounter (Signed)
Form DMV received,completed by Orpah Clinton at front desk for patient 06/10/15.

## 2015-07-03 ENCOUNTER — Emergency Department (HOSPITAL_COMMUNITY)
Admission: EM | Admit: 2015-07-03 | Discharge: 2015-07-03 | Disposition: A | Discharge status: Home / Self Care | Payer: Medicaid Other | Attending: Emergency Medicine | Admitting: Emergency Medicine

## 2015-07-03 ENCOUNTER — Encounter (HOSPITAL_COMMUNITY): Payer: Self-pay | Admitting: *Deleted

## 2015-07-03 DIAGNOSIS — I10 Essential (primary) hypertension: Secondary | ICD-10-CM | POA: Insufficient documentation

## 2015-07-03 DIAGNOSIS — F419 Anxiety disorder, unspecified: Secondary | ICD-10-CM | POA: Diagnosis not present

## 2015-07-03 DIAGNOSIS — Y9389 Activity, other specified: Secondary | ICD-10-CM | POA: Diagnosis not present

## 2015-07-03 DIAGNOSIS — Z87891 Personal history of nicotine dependence: Secondary | ICD-10-CM | POA: Insufficient documentation

## 2015-07-03 DIAGNOSIS — Y998 Other external cause status: Secondary | ICD-10-CM | POA: Insufficient documentation

## 2015-07-03 DIAGNOSIS — S3992XA Unspecified injury of lower back, initial encounter: Secondary | ICD-10-CM | POA: Insufficient documentation

## 2015-07-03 DIAGNOSIS — Y92410 Unspecified street and highway as the place of occurrence of the external cause: Secondary | ICD-10-CM | POA: Insufficient documentation

## 2015-07-03 DIAGNOSIS — M545 Low back pain, unspecified: Secondary | ICD-10-CM

## 2015-07-03 DIAGNOSIS — Z79899 Other long term (current) drug therapy: Secondary | ICD-10-CM | POA: Diagnosis not present

## 2015-07-03 MED ORDER — KETOROLAC TROMETHAMINE 30 MG/ML IJ SOLN
30.0000 mg | Freq: Once | INTRAMUSCULAR | Status: AC
  Administered 2015-07-03: 30 mg via INTRAMUSCULAR
  Filled 2015-07-03: qty 1

## 2015-07-03 MED ORDER — HYDROCODONE-ACETAMINOPHEN 5-325 MG PO TABS: 2.0000 | ORAL_TABLET | ORAL | Status: DC | PRN
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
## 2015-07-03 NOTE — ED Notes (Signed)
Declined W/C at D/C and was escorted to lobby by RN. 
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
## 2015-08-22 ENCOUNTER — Ambulatory Visit: Payer: Medicaid Other | Admitting: Nurse Practitioner

## 2015-09-12 ENCOUNTER — Emergency Department (HOSPITAL_COMMUNITY)
Admission: EM | Admit: 2015-09-12 | Discharge: 2015-09-12 | Disposition: A | Discharge status: Home / Self Care | Payer: Medicaid Other | Attending: Emergency Medicine | Admitting: Emergency Medicine

## 2015-09-12 ENCOUNTER — Encounter (HOSPITAL_COMMUNITY): Payer: Self-pay | Admitting: Family Medicine

## 2015-09-12 DIAGNOSIS — S99912A Unspecified injury of left ankle, initial encounter: Secondary | ICD-10-CM | POA: Diagnosis present

## 2015-09-12 DIAGNOSIS — Y9301 Activity, walking, marching and hiking: Secondary | ICD-10-CM | POA: Insufficient documentation

## 2015-09-12 DIAGNOSIS — F419 Anxiety disorder, unspecified: Secondary | ICD-10-CM | POA: Diagnosis not present

## 2015-09-12 DIAGNOSIS — G40909 Epilepsy, unspecified, not intractable, without status epilepticus: Secondary | ICD-10-CM | POA: Diagnosis not present

## 2015-09-12 DIAGNOSIS — Y998 Other external cause status: Secondary | ICD-10-CM | POA: Diagnosis not present

## 2015-09-12 DIAGNOSIS — G8929 Other chronic pain: Secondary | ICD-10-CM | POA: Insufficient documentation

## 2015-09-12 DIAGNOSIS — W108XXA Fall (on) (from) other stairs and steps, initial encounter: Secondary | ICD-10-CM | POA: Insufficient documentation

## 2015-09-12 DIAGNOSIS — Z87891 Personal history of nicotine dependence: Secondary | ICD-10-CM | POA: Diagnosis not present

## 2015-09-12 DIAGNOSIS — Z79899 Other long term (current) drug therapy: Secondary | ICD-10-CM | POA: Diagnosis not present

## 2015-09-12 DIAGNOSIS — Y9289 Other specified places as the place of occurrence of the external cause: Secondary | ICD-10-CM | POA: Insufficient documentation

## 2015-09-12 DIAGNOSIS — Z9884 Bariatric surgery status: Secondary | ICD-10-CM | POA: Diagnosis not present

## 2015-09-12 DIAGNOSIS — Z9889 Other specified postprocedural states: Secondary | ICD-10-CM | POA: Diagnosis not present

## 2015-09-12 DIAGNOSIS — M25572 Pain in left ankle and joints of left foot: Secondary | ICD-10-CM

## 2015-09-12 DIAGNOSIS — I10 Essential (primary) hypertension: Secondary | ICD-10-CM | POA: Insufficient documentation

## 2015-09-12 MED ORDER — ACETAMINOPHEN-CODEINE #3 300-30 MG PO TABS: 1.0000 | ORAL_TABLET | Freq: Four times a day (QID) | ORAL | Status: DC | PRN

## 2015-09-12 MED ORDER — HYDROCODONE-ACETAMINOPHEN 5-325 MG PO TABS
1.0000 | ORAL_TABLET | Freq: Once | ORAL | Status: AC
  Administered 2015-09-12: 1 via ORAL
  Filled 2015-09-12: qty 1

## 2015-09-12 NOTE — Discharge Instructions (Signed)
Take your medications as prescribed as needed for pain relief. Continue applying ice to her ankle for 15-20 minutes 3-4 times daily as needed for pain relief. Follow-up with your primary care provider in one week. Return to the emergency department if symptoms worsen or new onset of numbness, tingling, weakness.

## 2015-09-12 NOTE — ED Notes (Signed)
Per PA patient states she was leaving. Left without discharge papers.
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
## 2015-09-12 NOTE — ED Notes (Signed)
Pt here for left ankle pain and swelling. sts she fell down some stairs.

## 2015-10-10 ENCOUNTER — Emergency Department (HOSPITAL_COMMUNITY)
Admission: EM | Admit: 2015-10-10 | Discharge: 2015-10-10 | Disposition: A | Discharge status: Home / Self Care | Payer: Medicaid Other | Attending: Emergency Medicine | Admitting: Emergency Medicine

## 2015-10-10 ENCOUNTER — Encounter (HOSPITAL_COMMUNITY): Payer: Self-pay | Admitting: Emergency Medicine

## 2015-10-10 DIAGNOSIS — F419 Anxiety disorder, unspecified: Secondary | ICD-10-CM | POA: Insufficient documentation

## 2015-10-10 DIAGNOSIS — Z8781 Personal history of (healed) traumatic fracture: Secondary | ICD-10-CM | POA: Insufficient documentation

## 2015-10-10 DIAGNOSIS — M549 Dorsalgia, unspecified: Secondary | ICD-10-CM

## 2015-10-10 DIAGNOSIS — Y998 Other external cause status: Secondary | ICD-10-CM | POA: Diagnosis not present

## 2015-10-10 DIAGNOSIS — Y9389 Activity, other specified: Secondary | ICD-10-CM | POA: Insufficient documentation

## 2015-10-10 DIAGNOSIS — I10 Essential (primary) hypertension: Secondary | ICD-10-CM | POA: Insufficient documentation

## 2015-10-10 DIAGNOSIS — S299XXA Unspecified injury of thorax, initial encounter: Secondary | ICD-10-CM | POA: Diagnosis present

## 2015-10-10 DIAGNOSIS — F17210 Nicotine dependence, cigarettes, uncomplicated: Secondary | ICD-10-CM | POA: Diagnosis not present

## 2015-10-10 DIAGNOSIS — Y92410 Unspecified street and highway as the place of occurrence of the external cause: Secondary | ICD-10-CM | POA: Insufficient documentation

## 2015-10-10 MED ORDER — CYCLOBENZAPRINE HCL 10 MG PO TABS: 10.0000 mg | ORAL_TABLET | Freq: Two times a day (BID) | ORAL | Status: DC | PRN

## 2015-10-10 MED ORDER — HYDROCODONE-ACETAMINOPHEN 5-325 MG PO TABS: 1.0000 | ORAL_TABLET | ORAL | Status: DC | PRN
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
## 2015-11-14 ENCOUNTER — Telehealth: Payer: Self-pay

## 2015-11-14 ENCOUNTER — Emergency Department (HOSPITAL_COMMUNITY)
Admission: EM | Admit: 2015-11-14 | Discharge: 2015-11-14 | Disposition: A | Discharge status: Home / Self Care | Payer: Medicaid Other | Attending: Emergency Medicine | Admitting: Emergency Medicine

## 2015-11-14 ENCOUNTER — Encounter (HOSPITAL_COMMUNITY): Payer: Self-pay | Admitting: Emergency Medicine

## 2015-11-14 ENCOUNTER — Emergency Department (HOSPITAL_COMMUNITY)
Admission: EM | Admit: 2015-11-14 | Discharge: 2015-11-14 | Disposition: A | Discharge status: Home / Self Care | Payer: Medicaid Other | Source: Home / Self Care | Attending: Emergency Medicine | Admitting: Emergency Medicine

## 2015-11-14 DIAGNOSIS — Z9884 Bariatric surgery status: Secondary | ICD-10-CM | POA: Diagnosis not present

## 2015-11-14 DIAGNOSIS — I10 Essential (primary) hypertension: Secondary | ICD-10-CM

## 2015-11-14 DIAGNOSIS — R451 Restlessness and agitation: Secondary | ICD-10-CM | POA: Diagnosis not present

## 2015-11-14 DIAGNOSIS — M79661 Pain in right lower leg: Secondary | ICD-10-CM

## 2015-11-14 DIAGNOSIS — Z8659 Personal history of other mental and behavioral disorders: Secondary | ICD-10-CM | POA: Insufficient documentation

## 2015-11-14 DIAGNOSIS — R454 Irritability and anger: Secondary | ICD-10-CM | POA: Insufficient documentation

## 2015-11-14 DIAGNOSIS — Z793 Long term (current) use of hormonal contraceptives: Secondary | ICD-10-CM | POA: Insufficient documentation

## 2015-11-14 DIAGNOSIS — Z8781 Personal history of (healed) traumatic fracture: Secondary | ICD-10-CM

## 2015-11-14 DIAGNOSIS — M79662 Pain in left lower leg: Secondary | ICD-10-CM | POA: Insufficient documentation

## 2015-11-14 DIAGNOSIS — F17210 Nicotine dependence, cigarettes, uncomplicated: Secondary | ICD-10-CM | POA: Insufficient documentation

## 2015-11-14 DIAGNOSIS — D500 Iron deficiency anemia secondary to blood loss (chronic): Secondary | ICD-10-CM

## 2015-11-14 DIAGNOSIS — M79604 Pain in right leg: Secondary | ICD-10-CM

## 2015-11-14 DIAGNOSIS — Z79899 Other long term (current) drug therapy: Secondary | ICD-10-CM | POA: Insufficient documentation

## 2015-11-14 DIAGNOSIS — F419 Anxiety disorder, unspecified: Secondary | ICD-10-CM

## 2015-11-14 DIAGNOSIS — M79606 Pain in leg, unspecified: Secondary | ICD-10-CM

## 2015-11-14 DIAGNOSIS — M79605 Pain in left leg: Secondary | ICD-10-CM

## 2015-11-14 LAB — I-STAT CHEM 8, ED
BUN: 13 mg/dL (ref 6–20)
CREATININE: 0.7 mg/dL (ref 0.44–1.00)
Calcium, Ion: 1.13 mmol/L (ref 1.12–1.23)
Chloride: 106 mmol/L (ref 101–111)
GLUCOSE: 82 mg/dL (ref 65–99)
HEMATOCRIT: 23 % — AB (ref 36.0–46.0)
Hemoglobin: 7.8 g/dL — ABNORMAL LOW (ref 12.0–15.0)
POTASSIUM: 3.9 mmol/L (ref 3.5–5.1)
Sodium: 140 mmol/L (ref 135–145)
TCO2: 22 mmol/L (ref 0–100)

## 2015-11-14 LAB — CBC WITH DIFFERENTIAL/PLATELET
Basophils Absolute: 0.1 10*3/uL (ref 0.0–0.1)
Basophils Relative: 1 %
EOS ABS: 0.1 10*3/uL (ref 0.0–0.7)
Eosinophils Relative: 2 %
HEMATOCRIT: 22.9 % — AB (ref 36.0–46.0)
HEMOGLOBIN: 6.3 g/dL — AB (ref 12.0–15.0)
LYMPHS ABS: 2.2 10*3/uL (ref 0.7–4.0)
LYMPHS PCT: 35 %
MCH: 18.7 pg — ABNORMAL LOW (ref 26.0–34.0)
MCHC: 27.5 g/dL — AB (ref 30.0–36.0)
MCV: 68 fL — ABNORMAL LOW (ref 78.0–100.0)
Monocytes Absolute: 0.4 10*3/uL (ref 0.1–1.0)
Monocytes Relative: 7 %
NEUTROS ABS: 3.4 10*3/uL (ref 1.7–7.7)
Neutrophils Relative %: 55 %
Platelets: 348 10*3/uL (ref 150–400)
RBC: 3.37 MIL/uL — ABNORMAL LOW (ref 3.87–5.11)
RDW: 18 % — ABNORMAL HIGH (ref 11.5–15.5)
WBC: 6.2 10*3/uL (ref 4.0–10.5)

## 2015-11-14 LAB — VALPROIC ACID LEVEL: Valproic Acid Lvl: 71 ug/mL (ref 50.0–100.0)

## 2015-11-14 MED ORDER — LORAZEPAM 1 MG PO TABS
1.0000 mg | ORAL_TABLET | Freq: Once | ORAL | Status: AC
  Administered 2015-11-14: 1 mg via ORAL
  Filled 2015-11-14: qty 1

## 2015-11-14 MED ORDER — KETOROLAC TROMETHAMINE 60 MG/2ML IM SOLN
60.0000 mg | Freq: Once | INTRAMUSCULAR | Status: DC
  Filled 2015-11-14: qty 2

## 2015-11-14 NOTE — ED Notes (Signed)
Attempted to reassess patient's vitals and discharge her.  She started shouting and screaming saying no because we would not give her medication, I would not touch her.  She then picked up her bags and walked out the door.

## 2015-11-14 NOTE — ED Notes (Addendum)
Patient unable to sit still while taking BP and kept ripping BP cuff off saying it got too tight. Attempted twice.

## 2015-11-14 NOTE — ED Notes (Signed)
Delay in triage. Pt received phone call during vital signs.

## 2015-11-14 NOTE — ED Provider Notes (Signed)
CSN: [id number]     Arrival date & time 11/14/15  0253 History   First MD Initiated Contact with Patient 11/14/15 [phone]     Chief Complaint  Patient presents with  . Leg Problem     (Consider location/radiation/quality/duration/timing/severity/associated sxs/prior Treatment) HPI Comments: Patient presents with complaint of back pain radiating into bilateral lower extremities since 6:00 pm last night. No falls or injuries. No history of chronic back pain. She denies weakness of the LE's. No urinary incontinence or saddle anesthesia. No abdominal pain, dysuria, fever, nausea or vomiting. She states she took Tylenol at home without relief.   The history is provided by the patient. No language interpreter was used.    Past Medical History  Diagnosis Date  . Anxiety   . Mood swings ([place])   . Hypertension   . Seizures ([place])     Last seizure 8/15  . Ankle fracture   . Blood transfusion without reported diagnosis   . Gastric bypass status for obesity    Past Surgical History  Procedure Laterality Date  . Gastric bypass    . Wisdom tooth extraction    . External fixation leg Left 06/10/2014    Procedure: EXTERNAL FIXATION LEFT PILON FRACTURE;  Surgeon: Rozanna Box, MD;  Location: [place];  Service: Orthopedics;  Laterality: Left;  . I&d extremity Left 06/10/2014    Procedure: IRRIGATION AND DEBRIDEMENT LEFT OPEN FRACTURE; INCLUDING BONE;  Surgeon: Rozanna Box, MD;  Location: [place];  Service: Orthopedics;  Laterality: Left;  . Ankle closed reduction Right 06/10/2014    Procedure: CLOSED REDUCTION AND SPLINTING RIGHT TALUS FRACTURE DISLOCATION;  Surgeon: Rozanna Box, MD;  Location: [place];  Service: Orthopedics;  Laterality: Right;  . Orif fibula fracture Left 06/10/2014    Procedure: OPEN REDUCTION INTERNAL FIXATION (ORIF) PILON FRACTURE, FIBULA ONLY;  Surgeon: Rozanna Box, MD;  Location: [place];  Service: Orthopedics;  Laterality: Left;  . Orif ankle fracture Left 06/24/2014   Procedure: OPEN REDUCTION INTERNAL FIXATION (ORIF) TIBIA PILON  FRACTURE WITH REMOVAL OF EXTERNAL FIXATURE ;  Surgeon: Rozanna Box, MD;  Location: [place];  Service: Orthopedics;  Laterality: Left;  . Cast application Right [id number]    Procedure: CAST APPLICATION;  Surgeon: Rozanna Box, MD;  Location: [place];  Service: Orthopedics;  Laterality: Right;   Family History  Problem Relation Age of Onset  . High blood pressure Mother   . High Cholesterol Mother   . Stroke Father   . High blood pressure Father    Social History  Substance Use Topics  . Smoking status: Current Some Day Smoker    Types: Cigarettes  . Smokeless tobacco: Never Used  . Alcohol Use: No   OB History    Gravida Para Term Preterm AB TAB SAB Ectopic Multiple Living   3 3 3       0 1     Review of Systems  Constitutional: Negative for fever and chills.  Gastrointestinal: Negative.  Negative for nausea, vomiting and abdominal pain.  Genitourinary: Negative for enuresis.  Musculoskeletal:       See HPI.  Neurological: Negative.  Negative for weakness and numbness.      Allergies  Ibuprofen; Tramadol; and Flexeril  Home Medications   Prior to Admission medications   Medication Sig Start Date End Date Taking? Authorizing Provider  acetaminophen (TYLENOL) 325 MG tablet Take by mouth every 6 (six) hours as needed for mild pain.     Historical  Provider, MD  acetaminophen-codeine (TYLENOL #3) 300-30 MG tablet Take 1-2 tablets by mouth every 6 (six) hours as needed for moderate pain. 09/12/15   Nona Dell, PA-C  amoxicillin (AMOXIL) 500 MG capsule Take 1 capsule (500 mg total) by mouth 3 (three) times daily. Patient not taking: Reported on 06/05/2015 05/15/15   [name], PA-C  cyclobenzaprine (FLEXERIL) 10 MG tablet Take 1 tablet (10 mg total) by mouth 2 (two) times daily as needed. 10/10/15   Olivia Canter Sam, PA-C  divalproex (DEPAKOTE ER) 500 MG 24 hr tablet TAKE 4 TABLETS (2,000 MG TOTAL) BY  MOUTH DAILY. Patient taking differently: Take 2,000 mg by mouth at bedtime.  02/18/15   Dennie Bible, NP  HYDROcodone-acetaminophen (NORCO/VICODIN) 5-325 MG per tablet Take 2 tablets by mouth every 4 (four) hours as needed. 07/03/15   Hanna Patel-Mills, PA-C  HYDROcodone-acetaminophen (NORCO/VICODIN) 5-325 MG tablet Take 1 tablet by mouth every 4 (four) hours as needed. 10/10/15   Olivia Canter Sam, PA-C  JUNEL FE 1.5/30 1.5-30 MG-MCG tablet Take 1 tablet by mouth daily. 05/09/15   Historical Provider, MD  oxyCODONE-acetaminophen (PERCOCET) 7.5-325 MG per tablet Take 1 tablet by mouth every 6 (six) hours as needed for severe pain. Patient not taking: Reported on 06/05/2015 05/24/15   Sable Feil, PA-C   Pulse 110  Temp(Src) 98 F (36.7 C) (Oral)  Ht 5\' 4"  (1.626 m)  Wt 72.122 kg  BMI 27.28 kg/m2  SpO2 100%  LMP 10/31/2015 (Approximate) Physical Exam  Constitutional: She is oriented to person, place, and time. She appears well-developed and well-nourished.  Patient is restless in the bed, unable to sit still.  Neck: Normal range of motion.  Cardiovascular: Intact distal pulses.   Pulmonary/Chest: Effort normal.  Musculoskeletal: Normal range of motion.  Normal strength bilateral lower extremities. No swelling or redness. No midline or paralumbar tenderness.   Neurological: She is alert and oriented to person, place, and time.  Ambulatory without difficulty or imbalance.   Skin: Skin is warm and dry.  Psychiatric: Her affect is angry. She is agitated.    ED Course  Procedures (including critical care time) Labs Review Labs Reviewed - No data to display  Imaging Review No results found. I have personally reviewed and evaluated these images and lab results as part of my medical decision-making.   EKG Interpretation None      MDM   Final diagnoses:  None    1. Bilateral LE pain  The patient is unable to lie still. She is easily agitated and argumentative. The [place] database  consulted and patient is found to have multiple narcotic prescriptions from multiple sources. She initially denied receiving frequent prescriptions. Question whether current symptoms are related to withdrawal.   VSS. Her neurologic exam is without deficit. No neurologic red flags on history. She is given one Ativan in the department and will provide resource list for primary care providers.      Charlann Lange, PA-C 11/14/15 0405  Ripley Fraise, MD 11/14/15 [phone]

## 2015-11-14 NOTE — ED Notes (Signed)
Patient states "I want something really strong". Explained affects of toradol. Patient refuses. EDP Eulis Foster made aware that patient would like another pain medication. No new orders at this time. Will continue to monitor.

## 2015-11-14 NOTE — Telephone Encounter (Signed)
Request received from Joellyn Quails, RN CM requesting a follow up appointment for the patient. An appointment was scheduled for 11/21/15 @ 1430 with Chari Manning, NP at [facility].  The information was placed on the AVS and an update was provided to K. Lavina Hamman, RN CM.

## 2015-11-14 NOTE — ED Notes (Signed)
Patient states that she has been having pain shooting down her legs. Restless. Feels better when she walks. Alert and oriented.

## 2015-11-14 NOTE — ED Notes (Signed)
Pt able to ambulate. Pt walked out of room to ask when she will see a doctor and to ask for a snack. Pt was escorted back to her room. Pt described her pain as a "shooting pain" rating it 8/10. Pt demonstrated full range of motion and was able to move legs without difficulty.

## 2015-11-14 NOTE — ED Notes (Signed)
MD at bedside. 

## 2015-11-14 NOTE — Progress Notes (Addendum)
This pt came back to [place] in 2013 from [place] she has a sister in [place] who she has been with for a while "I don't know where she is. She is not answering her phone."  States she is "originally from [place]"   Pt states she fell once at her sister's home and went to "another doctor"   Pt states "some Dr in [place]" stated she had "Sciatica" Cm noting pt is thrashing constantly around in bed with moments of being in a fetal position on her left side to kicking her legs out straight until there are two sheets noted on the floor at the end of the bed.  Pt states at one time during assessment "Is everyone out there whispering about me? The other doctors told me the same thing" about number of visits to ED. CM explained to her that her number of visits can be seen in [facility] by all [facility] staff with access. ED RN came to pt room while she was using a loud tone of voice. pt with 9 ED visits- ED CM spoke [facility] CM to get pt an appt for f/u care  CM spoke with pt about differences in ED and pcp care Pt voiced in loud tones at intervals she was in pain and she feels like the ED staff are all "believing I'm not in pain" "they won't give me nothing" "I'm about to snap" States pain level is now an "eight"  She tells the CM she comes to ED vs pcp because she does not money to pay her co pay at pcp CM offered pt a list of [place] resources for financial assistance and discussed this concern about her medicaid co pay to [facility] CM. CM discussed how pcp can assist her to get on a plan of care to decrease or eliminate her crisis or emergency episodes Other resources provided in writing included CM spoke with pt who confirms uninsured [employer] resident with no pcp.  CM discussed and provided written information for www.needymeds.org, www.goodrx.com, discounted pharmacies and other [employer] such as [employer], affordable care act, financial assistance, uninsured dental services, [place]  med assist, DSS and  health department  Reviewed resources for [employer] [facility] urgent care plus others, medication resources, [facility] out patient pharmacies and housing Pt voiced understanding and appreciation of resources provided  These items placed in a pt belonging bag for pt Cm noted a grocery bag of items on pt bedside chair in which pt states she will place in the pt belonging bag "later". Cm picked pt red hat off pt's floor and placed it on her bed   By the time CM completed assessment with pt she had decreased her outbursts, loud tones to talking in a regular tone  Teach back method completed and pt was able to state she has an upcoming appt 11/21/15 at 1430 at [facility] Pt states she understands differences now between EDPs and pcps and the need to have a plan of care to manage her ongoing issues.

## 2015-11-14 NOTE — Discharge Instructions (Signed)
Your hemoglobin level and iron level are low. Take an iron pill, 325 mg, twice a day for 1 month. Take Colace, 100 mg twice a day to prevent constipation when you're taking iron. For the leg pain, take Tylenol every 4 hours.   Anemia, Nonspecific Anemia is a condition in which the concentration of red blood cells or hemoglobin in the blood is below normal. Hemoglobin is a substance in red blood cells that carries oxygen to the tissues of the body. Anemia results in not enough oxygen reaching these tissues.  CAUSES  Common causes of anemia include:   Excessive bleeding. Bleeding may be internal or external. This includes excessive bleeding from periods (in women) or from the intestine.   Poor nutrition.   Chronic kidney, thyroid, and liver disease.  Bone marrow disorders that decrease red blood cell production.  Cancer and treatments for cancer.  HIV, AIDS, and their treatments.  Spleen problems that increase red blood cell destruction.  Blood disorders.  Excess destruction of red blood cells due to infection, medicines, and autoimmune disorders. SIGNS AND SYMPTOMS   Minor weakness.   Dizziness.   Headache.  Palpitations.   Shortness of breath, especially with exercise.   Paleness.  Cold sensitivity.  Indigestion.  Nausea.  Difficulty sleeping.  Difficulty concentrating. Symptoms may occur suddenly or they may develop slowly.  DIAGNOSIS  Additional blood tests are often needed. These help your health care provider determine the best treatment. Your health care provider will check your stool for blood and look for other causes of blood loss.  TREATMENT  Treatment varies depending on the cause of the anemia. Treatment can include:   Supplements of iron, vitamin 123456, or folic acid.   Hormone medicines.   A blood transfusion. This may be needed if blood loss is severe.   Hospitalization. This may be needed if there is significant continual blood  loss.   Dietary changes.  Spleen removal. HOME CARE INSTRUCTIONS Keep all follow-up appointments. It often takes many weeks to correct anemia, and having your health care provider check on your condition and your response to treatment is very important. SEEK IMMEDIATE MEDICAL CARE IF:   You develop extreme weakness, shortness of breath, or chest pain.   You become dizzy or have trouble concentrating.  You develop heavy vaginal bleeding.   You develop a rash.   You have bloody or black, tarry stools.   You faint.   You vomit up blood.   You vomit repeatedly.   You have abdominal pain.  You have a fever or persistent symptoms for more than 2-3 days.   You have a fever and your symptoms suddenly get worse.   You are dehydrated.  MAKE SURE YOU:  Understand these instructions.  Will watch your condition.  Will get help right away if you are not doing well or get worse.   This information is not intended to replace advice given to you by your health care provider. Make sure you discuss any questions you have with your health care provider.   Document Released: 11/22/2004 Document Revised: 06/17/2013 Document Reviewed: 04/10/2013 Elsevier Interactive Patient Education [employer].

## 2015-11-14 NOTE — ED Notes (Signed)
Pt reports ongoing bilateral leg pain onset 0600 yesterday. Pt describes pain "like something is crawling on them." Pt continues to report dysuria and incontinence since event. Ambulatory to triage. Discharged this morning for same.

## 2015-11-14 NOTE — ED Provider Notes (Signed)
CSN: [id number]     Arrival date & time 11/14/15  [id number] History   First MD Initiated Contact with Patient 11/14/15 0756     Chief Complaint  Patient presents with  . Leg Pain     (Consider location/radiation/quality/duration/timing/severity/associated sxs/prior Treatment) HPI   Isabella Jenkins is a 40 y.o. female who presents for evaluation of painful legs, for several days, without trauma. Pain causes her to have to move her legs frequently. The pain is better with walking and standing. She told the nurse that she was having dysuria and urinary incontinence, but denied most symptoms to me. She denies back pain, fever, chills, cough or shortness of breath. She states she got in an argument with her mother and left her home, 3 days ago, and plans on NOT returning. There are no other known modifying factors.   Past Medical History  Diagnosis Date  . Anxiety   . Mood swings ([place])   . Hypertension   . Seizures ([place])     Last seizure 8/15  . Ankle fracture   . Blood transfusion without reported diagnosis   . Gastric bypass status for obesity    Past Surgical History  Procedure Laterality Date  . Gastric bypass    . Wisdom tooth extraction    . External fixation leg Left 06/10/2014    Procedure: EXTERNAL FIXATION LEFT PILON FRACTURE;  Surgeon: Rozanna Box, MD;  Location: [place];  Service: Orthopedics;  Laterality: Left;  . I&d extremity Left 06/10/2014    Procedure: IRRIGATION AND DEBRIDEMENT LEFT OPEN FRACTURE; INCLUDING BONE;  Surgeon: Rozanna Box, MD;  Location: [place];  Service: Orthopedics;  Laterality: Left;  . Ankle closed reduction Right 06/10/2014    Procedure: CLOSED REDUCTION AND SPLINTING RIGHT TALUS FRACTURE DISLOCATION;  Surgeon: Rozanna Box, MD;  Location: [place];  Service: Orthopedics;  Laterality: Right;  . Orif fibula fracture Left 06/10/2014    Procedure: OPEN REDUCTION INTERNAL FIXATION (ORIF) PILON FRACTURE, FIBULA ONLY;  Surgeon: Rozanna Box, MD;   Location: [place];  Service: Orthopedics;  Laterality: Left;  . Orif ankle fracture Left 06/24/2014    Procedure: OPEN REDUCTION INTERNAL FIXATION (ORIF) TIBIA PILON  FRACTURE WITH REMOVAL OF EXTERNAL FIXATURE ;  Surgeon: Rozanna Box, MD;  Location: [place];  Service: Orthopedics;  Laterality: Left;  . Cast application Right [id number]    Procedure: CAST APPLICATION;  Surgeon: Rozanna Box, MD;  Location: [place];  Service: Orthopedics;  Laterality: Right;   Family History  Problem Relation Age of Onset  . High blood pressure Mother   . High Cholesterol Mother   . Stroke Father   . High blood pressure Father    Social History  Substance Use Topics  . Smoking status: Current Some Day Smoker    Types: Cigarettes  . Smokeless tobacco: Never Used  . Alcohol Use: No   OB History    Gravida Para Term Preterm AB TAB SAB Ectopic Multiple Living   3 3 3       0 1     Review of Systems  All other systems reviewed and are negative.     Allergies  Ibuprofen; Tramadol; and Flexeril  Home Medications   Prior to Admission medications   Medication Sig Start Date End Date Taking? Authorizing Provider  divalproex (DEPAKOTE ER) 500 MG 24 hr tablet TAKE 4 TABLETS (2,000 MG TOTAL) BY MOUTH DAILY. Patient taking differently: Take 2,000 mg by mouth at bedtime.  02/18/15  Yes Dennie Bible, NP  JUNEL FE 1.5/30 1.5-30 MG-MCG tablet Take 1 tablet by mouth daily. 05/09/15  Yes Historical Provider, MD  acetaminophen-codeine (TYLENOL #3) 300-30 MG tablet Take 1-2 tablets by mouth every 6 (six) hours as needed for moderate pain. Patient not taking: Reported on 11/14/2015 09/12/15   Nona Dell, PA-C  cyclobenzaprine (FLEXERIL) 10 MG tablet Take 1 tablet (10 mg total) by mouth 2 (two) times daily as needed. Patient not taking: Reported on 11/14/2015 10/10/15   Olivia Canter Sam, PA-C  HYDROcodone-acetaminophen (NORCO/VICODIN) 5-325 MG per tablet Take 2 tablets by mouth every 4 (four) hours as  needed. Patient not taking: Reported on 11/14/2015 07/03/15   Ottie Glazier, PA-C  HYDROcodone-acetaminophen (NORCO/VICODIN) 5-325 MG tablet Take 1 tablet by mouth every 4 (four) hours as needed. Patient not taking: Reported on 11/14/2015 10/10/15   Olivia Canter Sam, PA-C  oxyCODONE-acetaminophen (PERCOCET) 7.5-325 MG per tablet Take 1 tablet by mouth every 6 (six) hours as needed for severe pain. Patient not taking: Reported on 06/05/2015 05/24/15   Sable Feil, PA-C   BP 135/107 mmHg  Pulse 87  Temp(Src) 97.8 F (36.6 C) (Oral)  SpO2 100%  LMP 10/31/2015 (Approximate) Physical Exam  Constitutional: She is oriented to person, place, and time. She appears well-developed and well-nourished. She appears distressed (She is restless, fidgety, continuing moving her legs.).  HENT:  Head: Normocephalic and atraumatic.  Right Ear: External ear normal.  Left Ear: External ear normal.  Eyes: Conjunctivae and EOM are normal. Pupils are equal, round, and reactive to light.  Neck: Normal range of motion and phonation normal. Neck supple.  Cardiovascular: Normal rate, regular rhythm and normal heart sounds.   Pulmonary/Chest: Effort normal and breath sounds normal. She exhibits no bony tenderness.  Abdominal: Soft. There is no tenderness.  Musculoskeletal: Normal range of motion.  Normal range of motion. Back, legs, bilaterally. On palpation. Laser no areas of focal tenderness.  Neurological: She is alert and oriented to person, place, and time. No cranial nerve deficit or sensory deficit. She exhibits normal muscle tone. Coordination normal.  Skin: Skin is warm, dry and intact.  Psychiatric: Her behavior is normal. Judgment and thought content normal.  Anxious  Nursing note and vitals reviewed.   ED Course  Procedures (including critical care time)   Medications  ketorolac (TORADOL) injection 60 mg (not administered)    Patient Vitals for the past 24 hrs:  BP Temp Temp src Pulse SpO2   11/14/15 0740 (!) 135/107 mmHg 97.8 F (36.6 C) Oral 87 100 %    10:56 AM Reevaluation with update and discussion. After initial assessment and treatment, an updated evaluation reveals patient refused Toradol injection. She states that "it doesn't work". She demanded narcotic pain relievers. I instructed her that I did not think she needed them at this time. She continued to demand narcotics. I informed her that she had anemia, iron deficient, and that it would require treatment. She refused to talk about this at all, and continued to demand narcotics. [place] Review Labs Reviewed - No data to display  Imaging Review No results found. I have personally reviewed and evaluated these images and lab results as part of my medical decision-making.   EKG Interpretation None      MDM   Final diagnoses:  Pain of lower extremity, unspecified laterality  Iron deficiency anemia due to chronic blood loss    Nonspecific leg discomfort, with iron deficiency anemia as  an incidental finding. Patient has no history for presyncope or syncope, chest pain, shortness of breath, weakness or dizziness.  Nursing Notes Reviewed/ Care Coordinated Applicable Imaging Reviewed Interpretation of Laboratory Data incorporated into ED treatment  The patient appears reasonably screened and/or stabilized for discharge and I doubt any other medical condition or other [facility] requiring further screening, evaluation, or treatment in the ED at this time prior to discharge.  Plan: Home Medications- Tylenol, Iron; Home Treatments- rest; return here if the recommended treatment, does not improve the symptoms; Recommended follow up- PCP 1 week and prn     Daleen Bo, MD 11/14/15 1058

## 2015-11-14 NOTE — ED Notes (Signed)
PA at bedside.

## 2015-11-14 NOTE — Discharge Instructions (Signed)
°Emergency Department Resource Guide °1) Find a Doctor and Pay Out of Pocket °Although you won't have to find out who is covered by your insurance plan, it is a good idea to ask around and get recommendations. You will then need to call the office and see if the doctor you have chosen will accept you as a new patient and what types of options they offer for patients who are self-pay. Some doctors offer discounts or will set up payment plans for their patients who do not have insurance, but you will need to ask so you aren't surprised when you get to your appointment. ° °2) Contact Your Local Health Department °Not all health departments have doctors that can see patients for sick visits, but many do, so it is worth a call to see if yours does. If you don't know where your local health department is, you can check in your phone book. The CDC also has a tool to help you locate your state's health department, and many state websites also have listings of all of their local health departments. ° °3) Find a Walk-in Clinic °If your illness is not likely to be very severe or complicated, you may want to try a walk in clinic. These are popping up all over the country in pharmacies, drugstores, and shopping centers. They're usually staffed by nurse practitioners or physician assistants that have been trained to treat common illnesses and complaints. They're usually fairly quick and inexpensive. However, if you have serious medical issues or chronic medical problems, these are probably not your best option. ° °No Primary Care Doctor: °- Call [facility] at  832-8000 - they can help you locate a primary care doctor that  accepts your insurance, provides certain services, etc. °- Physician Referral Service- 1-800-533-3463 ° °Chronic Pain Problems: °Organization         Address  Phone   Notes  °[facility]  (336) 297-2271 Patients need to be referred by their primary care doctor.  ° °Medication  Assistance: °Organization         Address  Phone   Notes  °[place] [facility] [address] °[place], [place] [address] (336) 641-8030 --Must be a resident of [place] °-- Must have NO insurance coverage whatsoever (no Medicaid/ Medicare, etc.) °-- The pt. MUST have a primary care doctor that directs their care regularly and follows them in the community °  °[facility]  (866) 331-1348   °[employer]  (888) 892-1162   ° °Agencies that provide inexpensive medical care: °Organization         Address  Phone   Notes  °[facility]  (336) 832-8035   °[facility]    (336) 832-7272   °[facility] [address] °[place], [place] [address] (336) 832-4777   °[facility] [address], °[place] (336) 271-4999   °[facility]    (336) 373-0678   °[facility]    (336) 272-1050   °[facility] ° [address], [place] Phone:  (336) 832-4444, Fax:  (336) 832-4440 Hours of Operation:  9 am - 6 pm, M-F.  Also accepts Medicaid/Medicare and self-pay.  °[facility] ° [address], [place] Phone: (336) [phone], Fax: (336) [phone]. Hours of Operation:  8:30 am - 5:30 pm, M-F.  Also accepts Medicaid and self-pay.  °[facility] [place] [address], [place] Phone: (336) 878-6027   °[facility] [address], [place], [place] (336)723-1848, Ext. 123 Mondays & Thursdays: 7-9 AM.  First 15 patients are seen on a first come, first serve basis. °  ° °Medicaid-accepting [place] Providers: ° °Organization         Address  Phone   Notes  °[facility] [address], [place] (336) 641-2100 Also accepts self-pay patients.  °[facility] [address], [place] ° (336) 856-9996   °[facility] [address], [place]  (336) 288-8857   °[facility] [address] [place] [address], [place] (336) 299-7000   °Veita Bland [address], [place]  ° (336) 373-1557 Only accepts [place] Access Medicaid patients after they have their name applied to their card.  ° °Self-Pay (no insurance) in [place]: ° °Organization         Address  Phone   Notes  °Sickle Cell Patients, [facility] [address], [place] (336) 832-1970   °[facility] [address], [place] (336) 832-4400   °[facility] ° [address], [place] (336) 992-4800   °[facility]/Dr. Osei-Bonsu ° [address] [place] [address], [place] or [address], [place] (336) [phone] Phone number for both [place] and [place] locations is the same.  °[facility] [address], [place] (336) 299-0000   °[facility] [address] [place] [address], [place] or [address] (336) 852-7530 °(336) 878-2260   °[facility] [address], [place] (336) 350-1642, phone; (336) 294-5005, fax Sees patients 1st and 3rd Saturday of every month.  Must not qualify for public or private insurance (i.e. Medicaid, Medicare, [place] Health Choice, Veterans' Benefits) • Household income should be no more than 200% of the poverty level •The clinic cannot treat you if you are pregnant or think you are pregnant • Sexually transmitted diseases are not treated at the clinic.  ° ° °Dental Care: °Organization         Address  Phone  Notes  °[place] [facility] [address], [place] (336) 641-6152 Accepts children up to age 21 who are enrolled in Medicaid or [place] Health Choice; pregnant women with a Medicaid card; and children who have applied for Medicaid or [place] Health Choice, but were declined, whose parents can pay a reduced fee at time of service.  °[place]  [facility] [place]  [address], [place] (336) 641-7733 Accepts children up to age 21 who are enrolled in Medicaid or [place] Health Choice; pregnant women with a Medicaid card; and children who have applied for Medicaid or [place] Health Choice, but were declined, whose parents can pay a reduced fee at time of service.  °[facility] ° [address],  (336) 641-4533 Patients are seen by appointment only. Walk-ins are not accepted. [facility] will see patients 18 years of age and older. °Monday - Tuesday (8am-5pm) °Most Wednesdays (8:30-5pm) °$30 per visit, cash only  °[facility] ° [address], [place] (336) [phone] Patients are seen by appointment only. Walk-ins are not accepted. [facility] will see patients 18 years of age and older. °One   Wednesday Evening (Monthly: Volunteer Based).  $30 per visit, cash only  °[facility]  (919) 537-3737 for adults; Children under age 4, call Graduate Pediatric Dentistry at ([phone]) [phone]. Children aged 4-14, please call ([phone]) [phone] to request a pediatric application. ° Dental services are provided in all areas of dental care including fillings, crowns and bridges, complete and partial dentures, implants, gum treatment, root canals, and extractions. Preventive care is also provided. Treatment is provided to both adults and children. °Patients are selected via a lottery and there is often a waiting list. °  °[facility] [address], °[place] ° (336) 763-8833 [email or web address] °  °[facility] [address], [place], [place] (336)723-1848, Ext. 123 Second and Fourth Thursday of each month, opens at 6:30 AM; Clinic ends at 9 AM.  Patients are seen on a first-come first-served basis, and a limited number are seen during each clinic.  ° °[facility] ° [address], [place], [place] (336) [phone]    Eligibility Requirements °You must have lived in [place], [place], or [place] counties for at least the last three months. °  You cannot be eligible for state or federal sponsored healthcare insurance, including Veterans Administration, Medicaid, or Medicare. °  You generally cannot be eligible for healthcare insurance through your employer.  °  How to apply: °Eligibility screenings are held every Tuesday and Wednesday afternoon from 1:00 pm until 4:00 pm. You do not need an appointment for the interview!  °[facility] [address], [place], [place] 336-631-2330   °[facility] [facility]  336-342-8273   °[facility]  336-703-3100   °[facility]  336-570-6415   ° °Behavioral Health Resources in the Community: °Intensive Outpatient Programs °Organization         Address  Phone  Notes  °[facility] [address], [place], [place] 336-878-6098   °[facility] [address], [place], [place] 336-832-9800   °[facility] [address], [place], [place] ° 336-882-2125   °[facility] [address],  °[place], [place] 1-800-853-5163 or 336-641-4981   °Substance Abuse Resources °Organization         Address  Phone  Notes  °[facility]  336-882-2125   °[facility]  336-784-9470   °[facility]  336-285-9073   °[facility]  336-845-3988   °Residential & Outpatient Substance Abuse Program  1-800-659-3381   °Psychological Services °Organization         Address  Phone  Notes  °[facility]  336- 832-9600   °[facility]  336- 378-7881   °[facility] [address], [place] 1-800-853-5163 or 336-641-4981   ° °Mobile Crisis Teams °Organization         Address  Phone  Notes  °[facility], Mobile Crisis Care Unit  1-877-626-1772   °Assertive °[facility] ° [address]  [place], [place] 336-834-9664   °Sharon DeEsch [address] °[place] 336-554-5454   ° °Self-Help/Support Groups °Organization         Address  Phone             Notes  °[facility] [place] - variety of support groups  336- 373-1402 Call for more information  °Narcotics Anonymous (NA), [facility] [address], °[place]  2 meetings at this location  ° °  Residential Treatment Programs °Organization         Address  Phone  Notes  °[facility] [address],    °[place]  1-866-801-8205   °[facility] ° [address], [place], [place] 704-293-8524   °[facility] [address], [place] 336-845-3988 Admissions: 8am-3pm M-F  °[facility] [address]    °[place], [place] 336-841-1104   °[facility] [address], [place], [place] 336-379-7146   °[facility] [address]  °[place], [place] 336-285-9073   °[facility] - Intensive Outpatient [address], [place], [place] 336-852-3033   °[facility] [address]  °[place], [place] 1-877-615-2722 or 336-784-9470   °[facility] [address] [place], [place] 336-227-7417 Accepts Medicaid  °[facility] [address]  °[place] 1-800-659-3381 Substance Abuse/Addiction Treatment  ° °[place] Behavioral Health Resources °Organization         Address  Phone  Notes  °[facility]  (888) 581-9988   °Julie Brannon, PhD [address] [place], [place]   (336) 349-5553 or (336) 951-0000   °[facility]   [address] °[place], [place] (336) 349-4454   °[facility] [address], [place], [place] (336) 342-8316 Insurance/Medicaid/sponsorship through [facility]  °[facility] [address]                                    [place], [place] (336) 342-8316 Therapy/tele-psych/case    °[facility] [address]  ° [place],  (336) 349-2233    °Dr. Arfeen  (336) 349-4544   °[facility] [place]  [employer] [place] [facility] 1) [address], [place] °2) [address], [place] °3)  [address], [place] (336) 349-3220 °(336) 342-7768 ° °(336) 342-8140   °[place] [facility] (336) 342-1394 or (336) 342-3537 (After Hours)    ° ° °

## 2015-11-21 ENCOUNTER — Inpatient Hospital Stay: Payer: Medicaid Other | Admitting: Internal Medicine

## 2015-12-19 ENCOUNTER — Emergency Department (HOSPITAL_COMMUNITY)
Admission: EM | Admit: 2015-12-19 | Discharge: 2015-12-19 | Disposition: A | Discharge status: Home / Self Care | Payer: Medicaid Other | Attending: Emergency Medicine | Admitting: Emergency Medicine

## 2015-12-19 ENCOUNTER — Encounter (HOSPITAL_COMMUNITY): Payer: Self-pay | Admitting: Emergency Medicine

## 2015-12-19 DIAGNOSIS — Z9884 Bariatric surgery status: Secondary | ICD-10-CM | POA: Diagnosis not present

## 2015-12-19 DIAGNOSIS — I10 Essential (primary) hypertension: Secondary | ICD-10-CM | POA: Insufficient documentation

## 2015-12-19 DIAGNOSIS — F17210 Nicotine dependence, cigarettes, uncomplicated: Secondary | ICD-10-CM | POA: Diagnosis not present

## 2015-12-19 DIAGNOSIS — R109 Unspecified abdominal pain: Secondary | ICD-10-CM | POA: Insufficient documentation

## 2015-12-19 NOTE — ED Notes (Signed)
When informed she would be put back in waiting room, pt sts "I can't stay here."

## 2015-12-19 NOTE — ED Notes (Signed)
Pt c/o lower to mid sharp abdominal pain x 1 week. Pt has hx of gastric bypass. Pt also c/o bilateral flank pain with bladder spasms. Denies hematuria. Pt c/o burning with urination. Pt c/o nausea. A&Ox4 and ambulatory.
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
## 2016-02-07 ENCOUNTER — Encounter (HOSPITAL_COMMUNITY): Payer: Self-pay | Admitting: Emergency Medicine

## 2016-02-07 ENCOUNTER — Emergency Department (HOSPITAL_COMMUNITY)
Admission: EM | Admit: 2016-02-07 | Discharge: 2016-02-08 | Disposition: A | Discharge status: Home / Self Care | Payer: Medicaid Other | Attending: Emergency Medicine | Admitting: Emergency Medicine

## 2016-02-07 ENCOUNTER — Emergency Department (HOSPITAL_COMMUNITY): Payer: Medicaid Other

## 2016-02-07 DIAGNOSIS — S40021A Contusion of right upper arm, initial encounter: Secondary | ICD-10-CM | POA: Diagnosis not present

## 2016-02-07 DIAGNOSIS — S63502A Unspecified sprain of left wrist, initial encounter: Secondary | ICD-10-CM | POA: Diagnosis not present

## 2016-02-07 DIAGNOSIS — I10 Essential (primary) hypertension: Secondary | ICD-10-CM | POA: Diagnosis not present

## 2016-02-07 DIAGNOSIS — Y998 Other external cause status: Secondary | ICD-10-CM | POA: Insufficient documentation

## 2016-02-07 DIAGNOSIS — Z79899 Other long term (current) drug therapy: Secondary | ICD-10-CM | POA: Diagnosis not present

## 2016-02-07 DIAGNOSIS — S5011XA Contusion of right forearm, initial encounter: Secondary | ICD-10-CM | POA: Diagnosis not present

## 2016-02-07 DIAGNOSIS — Z9884 Bariatric surgery status: Secondary | ICD-10-CM | POA: Diagnosis not present

## 2016-02-07 DIAGNOSIS — Y9389 Activity, other specified: Secondary | ICD-10-CM | POA: Insufficient documentation

## 2016-02-07 DIAGNOSIS — Z8659 Personal history of other mental and behavioral disorders: Secondary | ICD-10-CM | POA: Diagnosis not present

## 2016-02-07 DIAGNOSIS — Z8639 Personal history of other endocrine, nutritional and metabolic disease: Secondary | ICD-10-CM | POA: Insufficient documentation

## 2016-02-07 DIAGNOSIS — F17210 Nicotine dependence, cigarettes, uncomplicated: Secondary | ICD-10-CM | POA: Insufficient documentation

## 2016-02-07 DIAGNOSIS — S50812A Abrasion of left forearm, initial encounter: Secondary | ICD-10-CM | POA: Diagnosis not present

## 2016-02-07 DIAGNOSIS — T148XXA Other injury of unspecified body region, initial encounter: Secondary | ICD-10-CM

## 2016-02-07 DIAGNOSIS — Z8781 Personal history of (healed) traumatic fracture: Secondary | ICD-10-CM | POA: Diagnosis not present

## 2016-02-07 DIAGNOSIS — S0990XA Unspecified injury of head, initial encounter: Secondary | ICD-10-CM

## 2016-02-07 DIAGNOSIS — Y9289 Other specified places as the place of occurrence of the external cause: Secondary | ICD-10-CM | POA: Diagnosis not present

## 2016-02-07 DIAGNOSIS — S6992XA Unspecified injury of left wrist, hand and finger(s), initial encounter: Secondary | ICD-10-CM | POA: Diagnosis present
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
## 2016-02-07 NOTE — ED Notes (Signed)
Patient c/o right hip pain with no bruising or deformity, left wrist pain with bruising and swelling and HA.

## 2016-02-08 MED ORDER — OXYCODONE-ACETAMINOPHEN 5-325 MG PO TABS
1.0000 | ORAL_TABLET | Freq: Once | ORAL | Status: AC
  Administered 2016-02-08: 1 via ORAL
  Filled 2016-02-08: qty 1

## 2016-02-08 MED ORDER — HYDROCODONE-ACETAMINOPHEN 5-325 MG PO TABS: 2.0000 | ORAL_TABLET | ORAL | Status: DC | PRN

## 2016-02-08 NOTE — ED Notes (Signed)
Pt refusing to dress out in gown

## 2016-02-08 NOTE — Discharge Instructions (Signed)
Wrist Sprain A wrist sprain is a stretch or tear in the strong, fibrous tissues (ligaments) that connect your wrist bones. The ligaments of your wrist may be easily sprained. There are three types of wrist sprains.  Grade 1. The ligament is not stretched or torn, but the sprain causes pain.  Grade 2. The ligament is stretched or partially torn. You may be able to move your wrist, but not very much.  Grade 3. The ligament or muscle completely tears. You may find it difficult or extremely painful to move your wrist even a little. CAUSES Often, wrist sprains are a result of a fall or an injury. The force of the impact causes the fibers of your ligament to stretch too much or tear. Common causes of wrist sprains include:  Overextending your wrist while catching a ball with your hands.  Repetitive or strenuous extension or bending of your wrist.  Landing on your hand during a fall. RISK FACTORS  Having previous wrist injuries.  Playing contact sports, such as boxing or wrestling.  Participating in activities in which falling is common.  Having poor wrist strength and flexibility. SIGNS AND SYMPTOMS  Wrist pain.  Wrist tenderness.  Inflammation or bruising of the wrist area.  Hearing a "pop" or feeling a tear at the time of the injury.  Decreased wrist movement due to pain, stiffness, or weakness. DIAGNOSIS Your health care provider will examine your wrist. In some cases, an X-ray will be taken to make sure you did not break any bones. If your health care provider thinks that you tore a ligament, he or she may order an MRI of your wrist. TREATMENT Treatment involves resting and icing your wrist. You may also need to take pain medicines to help lessen pain and inflammation. Your health care provider may recommend keeping your wrist still (immobilized) with a splint to help your sprain heal. When the splint is no longer necessary, you may need to perform strengthening and stretching  exercises. These exercises help you to regain strength and full range of motion in your wrist. Surgery is not usually needed for wrist sprains unless the ligament completely tears. HOME CARE INSTRUCTIONS  Rest your wrist. Do not do things that cause pain.  Wear your wrist splint as directed by your health care provider.  Take medicines only as directed by your health care provider.  To ease pain and swelling, apply ice to the injured area.  Put ice in a plastic bag.  Place a towel between your skin and the bag.  Leave the ice on for 20 minutes, 2-3 times a day. SEEK MEDICAL CARE IF:  Your pain, discomfort, or swelling gets worse even with treatment.  You feel sudden numbness in your hand.   This information is not intended to replace advice given to you by your health care provider. Make sure you discuss any questions you have with your health care provider.   Document Released: 06/18/2014 Document Reviewed: 06/18/2014 Elsevier Interactive Patient Education 2016 [place] Injury, Adult You have a head injury. Headaches and throwing up (vomiting) are common after a head injury. It should be easy to wake up from sleeping. Sometimes you must stay in the hospital. Most problems happen within the first 24 hours. Side effects may occur up to 7-10 days after the injury.  WHAT ARE THE TYPES OF HEAD INJURIES? Head injuries can be as minor as a bump. Some head injuries can be more severe. More severe head injuries include:  A jarring injury to the brain (concussion).  A bruise of the brain (contusion). This mean there is bleeding in the brain that can cause swelling.  A cracked skull (skull fracture).  Bleeding in the brain that collects, clots, and forms a bump (hematoma). WHEN SHOULD I GET HELP RIGHT AWAY?   You are confused or sleepy.  You cannot be woken up.  You feel sick to your stomach (nauseous) or keep throwing up (vomiting).  Your dizziness or unsteadiness is  getting worse.  You have very bad, lasting headaches that are not helped by medicine. Take medicines only as told by your doctor.  You cannot use your arms or legs like normal.  You cannot walk.  You notice changes in the black spots in the center of the colored part of your eye (pupil).  You have clear or bloody fluid coming from your nose or ears.  You have trouble seeing. During the next 24 hours after the injury, you must stay with someone who can watch you. This person should get help right away (call 911 in the [place] if you start to shake and are not able to control it (have seizures), you pass out, or you are unable to wake up. HOW CAN I PREVENT A HEAD INJURY IN THE FUTURE?  Wear seat belts.  Wear a helmet while bike riding and playing sports like football.  Stay away from dangerous activities around the house. WHEN CAN I RETURN TO NORMAL ACTIVITIES AND ATHLETICS? See your doctor before doing these activities. You should not do normal activities or play contact sports until 1 week after the following symptoms have stopped:  Headache that does not go away.  Dizziness.  Poor attention.  Confusion.  Memory problems.  Sickness to your stomach or throwing up.  Tiredness.  Fussiness.  Bothered by bright lights or loud noises.  Anxiousness or depression.  Restless sleep. MAKE SURE YOU:   Understand these instructions.  Will watch your condition.  Will get help right away if you are not doing well or get worse.   This information is not intended to replace advice given to you by your health care provider. Make sure you discuss any questions you have with your health care provider.   Document Released: 09/27/2008 Document Revised: 11/05/2014 Document Reviewed: 06/22/2013 Elsevier Interactive Patient Education 2016 [place] A contusion is a deep bruise. Contusions are the result of a blunt injury to tissues and muscle fibers under the skin. The  injury causes bleeding under the skin. The skin overlying the contusion may turn blue, purple, or yellow. Minor injuries will give you a painless contusion, but more severe contusions may stay painful and swollen for a few weeks.  CAUSES  This condition is usually caused by a blow, trauma, or direct force to an area of the body. SYMPTOMS  Symptoms of this condition include:  Swelling of the injured area.  Pain and tenderness in the injured area.  Discoloration. The area may have redness and then turn blue, purple, or yellow. DIAGNOSIS  This condition is diagnosed based on a physical exam and medical history. An X-ray, CT scan, or MRI may be needed to determine if there are any associated injuries, such as broken bones (fractures). TREATMENT  Specific treatment for this condition depends on what area of the body was injured. In general, the best treatment for a contusion is resting, icing, applying pressure to (compression), and elevating the injured area. This is often called the RICE  strategy. Over-the-counter anti-inflammatory medicines may also be recommended for pain control.  HOME CARE INSTRUCTIONS   Rest the injured area.  If directed, apply ice to the injured area:  Put ice in a plastic bag.  Place a towel between your skin and the bag.  Leave the ice on for 20 minutes, 2-3 times per day.  If directed, apply light compression to the injured area using an elastic bandage. Make sure the bandage is not wrapped too tightly. Remove and reapply the bandage as directed by your health care provider.  If possible, raise (elevate) the injured area above the level of your heart while you are sitting or lying down.  Take over-the-counter and prescription medicines only as told by your health care provider. SEEK MEDICAL CARE IF:  Your symptoms do not improve after several days of treatment.  Your symptoms get worse.  You have difficulty moving the injured area. SEEK IMMEDIATE MEDICAL  CARE IF:   You have severe pain.  You have numbness in a hand or foot.  Your hand or foot turns pale or cold.   This information is not intended to replace advice given to you by your health care provider. Make sure you discuss any questions you have with your health care provider.   Document Released: 07/25/2005 Document Revised: 07/06/2015 Document Reviewed: 03/02/2015 Elsevier Interactive Patient Education [employer].
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
## 2016-03-06 ENCOUNTER — Telehealth: Payer: Self-pay | Admitting: Nurse Practitioner

## 2016-03-06 NOTE — Telephone Encounter (Signed)
Patient requesting refill of divalproex (DEPAKOTE ER) 500 MG 24 hr tablet Pharmacy:[id number] - [place] - [address] AT [address]  Pt has an appt for 03/13/16

## 2016-03-07 MED ORDER — DIVALPROEX SODIUM ER 500 MG PO TB24: ORAL_TABLET | ORAL | Status: DC

## 2016-03-07 NOTE — Telephone Encounter (Signed)
1 month supply sent in

## 2016-03-13 ENCOUNTER — Ambulatory Visit (INDEPENDENT_AMBULATORY_CARE_PROVIDER_SITE_OTHER): Payer: Medicaid Other | Admitting: Nurse Practitioner

## 2016-03-13 ENCOUNTER — Encounter: Payer: Self-pay | Admitting: Nurse Practitioner

## 2016-03-13 ENCOUNTER — Ambulatory Visit: Payer: Medicaid Other | Admitting: Nurse Practitioner

## 2016-03-13 VITALS — BP 108/72 | HR 88 | Ht 64.0 in | Wt 154.4 lb

## 2016-03-13 DIAGNOSIS — G40909 Epilepsy, unspecified, not intractable, without status epilepticus: Secondary | ICD-10-CM | POA: Diagnosis not present

## 2016-03-13 DIAGNOSIS — F329 Major depressive disorder, single episode, unspecified: Secondary | ICD-10-CM

## 2016-03-13 DIAGNOSIS — F32A Depression, unspecified: Secondary | ICD-10-CM

## 2016-03-13 MED ORDER — DIVALPROEX SODIUM ER 500 MG PO TB24: ORAL_TABLET | ORAL | Status: DC

## 2016-03-13 MED ORDER — ESCITALOPRAM OXALATE 10 MG PO TABS
10.0000 mg | ORAL_TABLET | Freq: Every day | ORAL | Status: DC
Start: 2016-03-13 — End: 2019-06-30

## 2016-03-13 NOTE — Patient Instructions (Addendum)
Call [phone] and ask about getting PCP Depakote level Therapeutic on 11/14/2015  Continue Depakote at current dose will refill Lexapro 10mg  daily for depression Follow-up in1 year Call for any seizure activity
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
## 2016-03-14 NOTE — Progress Notes (Signed)
I have reviewed and agreed above plan. 

## 2016-09-13 ENCOUNTER — Ambulatory Visit: Payer: Medicaid Other | Admitting: Nurse Practitioner

## 2016-09-19 ENCOUNTER — Ambulatory Visit: Payer: Medicaid Other | Admitting: Nurse Practitioner

## 2016-10-15 ENCOUNTER — Encounter: Payer: Self-pay | Admitting: Nurse Practitioner

## 2016-10-15 ENCOUNTER — Ambulatory Visit (INDEPENDENT_AMBULATORY_CARE_PROVIDER_SITE_OTHER): Payer: Medicaid Other | Admitting: Nurse Practitioner

## 2016-10-15 VITALS — BP 127/88 | HR 87 | Ht 64.0 in | Wt 159.2 lb

## 2016-10-15 DIAGNOSIS — G40909 Epilepsy, unspecified, not intractable, without status epilepticus: Secondary | ICD-10-CM

## 2016-10-15 MED ORDER — DIVALPROEX SODIUM ER 500 MG PO TB24: ORAL_TABLET | ORAL | 0 refills | Status: DC

## 2016-10-15 NOTE — Progress Notes (Signed)
I have reviewed and agreed above plan. 

## 2016-10-15 NOTE — Patient Instructions (Addendum)
Continue Depakote at current dose will refill Will check labs today Call for any seizure activity Important to get a primary care provider [place] [facility] [phone] ,  Follow-up in 1 year
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
## 2016-10-16 ENCOUNTER — Telehealth: Payer: Self-pay | Admitting: *Deleted

## 2016-10-16 ENCOUNTER — Ambulatory Visit: Payer: Self-pay | Admitting: Nurse Practitioner

## 2016-10-16 LAB — COMPREHENSIVE METABOLIC PANEL
A/G RATIO: 1.2 (ref 1.2–2.2)
ALK PHOS: 71 IU/L (ref 39–117)
ALT: 9 IU/L (ref 0–32)
AST: 8 IU/L (ref 0–40)
Albumin: 3.8 g/dL (ref 3.5–5.5)
BUN / CREAT RATIO: 21 (ref 9–23)
BUN: 15 mg/dL (ref 6–20)
Bilirubin Total: 0.3 mg/dL (ref 0.0–1.2)
CO2: 22 mmol/L (ref 18–29)
Calcium: 8.5 mg/dL — ABNORMAL LOW (ref 8.7–10.2)
Chloride: 100 mmol/L (ref 96–106)
Creatinine, Ser: 0.72 mg/dL (ref 0.57–1.00)
GFR calc Af Amer: 124 mL/min/{1.73_m2} (ref >59)
GFR calc non Af Amer: 107 mL/min/{1.73_m2} (ref >59)
GLOBULIN, TOTAL: 3.2 g/dL (ref 1.5–4.5)
Glucose: 81 mg/dL (ref 65–99)
POTASSIUM: 4.7 mmol/L (ref 3.5–5.2)
SODIUM: 137 mmol/L (ref 134–144)
Total Protein: 7 g/dL (ref 6.0–8.5)

## 2016-10-16 LAB — CBC WITH DIFFERENTIAL/PLATELET
BASOS: 1 %
Basophils Absolute: 0 10*3/uL (ref 0.0–0.2)
EOS (ABSOLUTE): 0.3 10*3/uL (ref 0.0–0.4)
Eos: 5 %
Hematocrit: 24.6 % — ABNORMAL LOW (ref 34.0–46.6)
Hemoglobin: 6.9 g/dL — CL (ref 11.1–15.9)
IMMATURE GRANULOCYTES: 0 %
Immature Grans (Abs): 0 10*3/uL (ref 0.0–0.1)
LYMPHS ABS: 2.4 10*3/uL (ref 0.7–3.1)
Lymphs: 42 %
MCH: 17.5 pg — ABNORMAL LOW (ref 26.6–33.0)
MCHC: 28 g/dL — AB (ref 31.5–35.7)
MCV: 62 fL — AB (ref 79–97)
MONOS ABS: 0.4 10*3/uL (ref 0.1–0.9)
Monocytes: 6 %
NEUTROS PCT: 46 %
Neutrophils Absolute: 2.7 10*3/uL (ref 1.4–7.0)
PLATELETS: 344 10*3/uL (ref 150–379)
RBC: 3.95 x10E6/uL (ref 3.77–5.28)
RDW: 19.5 % — AB (ref 12.3–15.4)
WBC: 5.9 10*3/uL (ref 3.4–10.8)

## 2016-10-16 LAB — LIPASE: Lipase: 28 U/L (ref 14–72)

## 2016-10-16 LAB — VALPROIC ACID LEVEL: Valproic Acid Lvl: 96 ug/mL (ref 50–100)

## 2016-10-16 NOTE — Telephone Encounter (Signed)
-----   Message from Dennie Bible, NP sent at 10/16/2016 11:53 AM EST ----- Reviewed results with Dr. Krista Blue who agrees she needs to follow-up with her primary care regarding her hemoglobin of 6.9 which appears to be an ongoing issue. She was given the phone number and the name of her primary care yesterday on her visit

## 2016-10-16 NOTE — Telephone Encounter (Signed)
I spoke to pt and relayed her lab results.  Noted that VPA and lipase normal.  Her hemoglobin low, which has been ongoing problem.  She had not seen her pcp or contacted , Isabella Manning, MD with [facility].  She has no occult blood in stool.  She has c/o of abd pain.  I told her I would fax over results.  She will call and make appt for evaluation.

## 2016-12-01 ENCOUNTER — Other Ambulatory Visit: Payer: Self-pay | Admitting: Nurse Practitioner

## 2016-12-03 ENCOUNTER — Other Ambulatory Visit: Payer: Self-pay | Admitting: *Deleted

## 2016-12-03 MED ORDER — DIVALPROEX SODIUM ER 500 MG PO TB24: ORAL_TABLET | ORAL | 11 refills | Status: DC

## 2016-12-03 NOTE — Telephone Encounter (Signed)
Done

## 2016-12-03 NOTE — Telephone Encounter (Signed)
Pt called [facility] with refills. Pt said she is out of medication

## 2017-07-09 ENCOUNTER — Ambulatory Visit: Payer: Medicaid Other | Admitting: Family Medicine
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
## 2017-10-15 ENCOUNTER — Ambulatory Visit: Payer: Medicaid Other | Admitting: Nurse Practitioner

## 2017-10-15 ENCOUNTER — Encounter: Payer: Self-pay | Admitting: Nurse Practitioner

## 2017-10-15 VITALS — BP 154/96 | HR 82 | Ht 64.0 in | Wt 144.4 lb

## 2017-10-15 DIAGNOSIS — G40909 Epilepsy, unspecified, not intractable, without status epilepticus: Secondary | ICD-10-CM

## 2017-10-15 DIAGNOSIS — Z5181 Encounter for therapeutic drug level monitoring: Secondary | ICD-10-CM | POA: Diagnosis not present

## 2017-10-15 MED ORDER — DIVALPROEX SODIUM ER 500 MG PO TB24: ORAL_TABLET | ORAL | 11 refills | Status: DC

## 2017-10-15 NOTE — Patient Instructions (Signed)
Continue Depakote at current dose will refill  Will check labs today, CBC, CMP and lipase Call for any seizure activity Follow-up in 1 yea

## 2017-10-15 NOTE — Progress Notes (Signed)
I have reviewed and agreed above plan. 

## 2017-10-16 ENCOUNTER — Telehealth: Payer: Self-pay

## 2017-10-16 LAB — COMPREHENSIVE METABOLIC PANEL
ALBUMIN: 3.6 g/dL (ref 3.5–5.5)
ALK PHOS: 60 IU/L (ref 39–117)
ALT: 9 IU/L (ref 0–32)
AST: 10 IU/L (ref 0–40)
Albumin/Globulin Ratio: 1.3 (ref 1.2–2.2)
BILIRUBIN TOTAL: 0.3 mg/dL (ref 0.0–1.2)
BUN / CREAT RATIO: 25 — AB (ref 9–23)
BUN: 15 mg/dL (ref 6–20)
CHLORIDE: 104 mmol/L (ref 96–106)
CO2: 26 mmol/L (ref 20–29)
Calcium: 8.5 mg/dL — ABNORMAL LOW (ref 8.7–10.2)
Creatinine, Ser: 0.61 mg/dL (ref 0.57–1.00)
GFR calc Af Amer: 133 mL/min/{1.73_m2} (ref >59)
GFR calc non Af Amer: 115 mL/min/{1.73_m2} (ref >59)
GLUCOSE: 84 mg/dL (ref 65–99)
Globulin, Total: 2.7 g/dL (ref 1.5–4.5)
Potassium: 4.2 mmol/L (ref 3.5–5.2)
Sodium: 140 mmol/L (ref 134–144)
Total Protein: 6.3 g/dL (ref 6.0–8.5)

## 2017-10-16 LAB — CBC WITH DIFFERENTIAL/PLATELET
Basophils Absolute: 0.1 10*3/uL (ref 0.0–0.2)
Basos: 1 %
EOS (ABSOLUTE): 0.2 10*3/uL (ref 0.0–0.4)
EOS: 4 %
HEMATOCRIT: 31.3 % — AB (ref 34.0–46.6)
HEMOGLOBIN: 8.8 g/dL — AB (ref 11.1–15.9)
IMMATURE GRANS (ABS): 0 10*3/uL (ref 0.0–0.1)
Immature Granulocytes: 0 %
LYMPHS ABS: 2 10*3/uL (ref 0.7–3.1)
LYMPHS: 39 %
MCH: 20.9 pg — ABNORMAL LOW (ref 26.6–33.0)
MCHC: 28.1 g/dL — AB (ref 31.5–35.7)
MCV: 74 fL — ABNORMAL LOW (ref 79–97)
MONOCYTES: 8 %
Monocytes Absolute: 0.4 10*3/uL (ref 0.1–0.9)
NEUTROS ABS: 2.4 10*3/uL (ref 1.4–7.0)
Neutrophils: 48 %
Platelets: 341 10*3/uL (ref 150–379)
RBC: 4.21 x10E6/uL (ref 3.77–5.28)
RDW: 18.1 % — ABNORMAL HIGH (ref 12.3–15.4)
WBC: 5 10*3/uL (ref 3.4–10.8)

## 2017-10-16 LAB — VALPROIC ACID LEVEL: VALPROIC ACID LVL: 79 ug/mL (ref 50–100)

## 2017-10-16 NOTE — Telephone Encounter (Signed)
I left a detailed message with lab results, ok per dpr. I advised patient to call back with any questions or concerns and made her aware to contact her PCP regarding her low hemoglobin.

## 2017-10-16 NOTE — Telephone Encounter (Signed)
-----   Message from Dennie Bible, NP sent at 10/16/2017  9:40 AM EST ----- Except for low hemoglobin at 8.8 and an anemia.  Good level of Depakote.  Need to follow-up with your primary care regarding your anemia.  Please call the patient
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
## 2018-08-25 ENCOUNTER — Encounter: Payer: Self-pay | Admitting: Nurse Practitioner

## 2018-08-25 ENCOUNTER — Ambulatory Visit: Payer: Medicaid Other | Admitting: Nurse Practitioner

## 2018-08-25 VITALS — BP 123/88 | HR 91 | Ht 64.0 in | Wt 161.0 lb

## 2018-08-25 DIAGNOSIS — Z5181 Encounter for therapeutic drug level monitoring: Secondary | ICD-10-CM | POA: Diagnosis not present

## 2018-08-25 DIAGNOSIS — G40909 Epilepsy, unspecified, not intractable, without status epilepticus: Secondary | ICD-10-CM

## 2018-08-25 NOTE — Patient Instructions (Signed)
Continue Depakote at current dose will refill  Will check labs today, CBC, CMP to monitor adverse effects of Depakote Depakote level to monitor for therapeutic range/toxicity  Will check lipase  Call for any seizure activity Follow-up in 1 year Patient was encouraged to obtain  primary care physician for medical maintenance

## 2018-08-25 NOTE — Progress Notes (Signed)
I have reviewed and agreed above plan. 

## 2018-08-26 ENCOUNTER — Telehealth: Payer: Self-pay | Admitting: *Deleted

## 2018-08-26 DIAGNOSIS — D509 Iron deficiency anemia, unspecified: Secondary | ICD-10-CM

## 2018-08-26 LAB — CBC WITH DIFFERENTIAL/PLATELET
BASOS: 1 %
Basophils Absolute: 0.1 10*3/uL (ref 0.0–0.2)
EOS (ABSOLUTE): 0.3 10*3/uL (ref 0.0–0.4)
Eos: 6 %
Hematocrit: 29.7 % — ABNORMAL LOW (ref 34.0–46.6)
Hemoglobin: 8.2 g/dL — ABNORMAL LOW (ref 11.1–15.9)
IMMATURE GRANS (ABS): 0 10*3/uL (ref 0.0–0.1)
Immature Granulocytes: 0 %
Lymphocytes Absolute: 2 10*3/uL (ref 0.7–3.1)
Lymphs: 37 %
MCH: 19.3 pg — AB (ref 26.6–33.0)
MCHC: 27.6 g/dL — ABNORMAL LOW (ref 31.5–35.7)
MCV: 70 fL — AB (ref 79–97)
MONOS ABS: 0.5 10*3/uL (ref 0.1–0.9)
Monocytes: 9 %
NEUTROS ABS: 2.5 10*3/uL (ref 1.4–7.0)
Neutrophils: 47 %
PLATELETS: 296 10*3/uL (ref 150–450)
RBC: 4.24 x10E6/uL (ref 3.77–5.28)
RDW: 16.9 % — AB (ref 12.3–15.4)
WBC: 5.4 10*3/uL (ref 3.4–10.8)

## 2018-08-26 LAB — COMPREHENSIVE METABOLIC PANEL
A/G RATIO: 1.4 (ref 1.2–2.2)
ALT: 5 IU/L (ref 0–32)
AST: 8 IU/L (ref 0–40)
Albumin: 3.9 g/dL (ref 3.5–5.5)
Alkaline Phosphatase: 60 IU/L (ref 39–117)
BUN / CREAT RATIO: 19 (ref 9–23)
BUN: 13 mg/dL (ref 6–20)
Bilirubin Total: 0.4 mg/dL (ref 0.0–1.2)
CALCIUM: 8.8 mg/dL (ref 8.7–10.2)
CO2: 18 mmol/L — ABNORMAL LOW (ref 20–29)
Chloride: 106 mmol/L (ref 96–106)
Creatinine, Ser: 0.69 mg/dL (ref 0.57–1.00)
GFR calc non Af Amer: 110 mL/min/{1.73_m2} (ref >59)
GFR, EST AFRICAN AMERICAN: 127 mL/min/{1.73_m2} (ref >59)
GLOBULIN, TOTAL: 2.8 g/dL (ref 1.5–4.5)
Glucose: 48 mg/dL — ABNORMAL LOW (ref 65–99)
POTASSIUM: 4.1 mmol/L (ref 3.5–5.2)
SODIUM: 138 mmol/L (ref 134–144)
TOTAL PROTEIN: 6.7 g/dL (ref 6.0–8.5)

## 2018-08-26 LAB — VALPROIC ACID LEVEL: Valproic Acid Lvl: 101 ug/mL — ABNORMAL HIGH (ref 50–100)

## 2018-08-26 LAB — LIPASE: Lipase: 27 U/L (ref 14–72)

## 2018-08-26 NOTE — Telephone Encounter (Signed)
-----   Message from Dennie Bible, NP sent at 08/26/2018  8:31 AM EDT ----- Discussed with Dr. Krista Blue hemoglobin is low at 8.2.  Depakote level okay.  Lipase okay.  We will refer patient to hematologist please set this up [facility].  Patient does not have a primary care provider

## 2018-08-26 NOTE — Telephone Encounter (Signed)
Called mobile/home #, not able to LM (VM full).

## 2018-08-28 NOTE — Telephone Encounter (Signed)
Spoke to pt and relayed the results of her labs.  Her Hemoglobin low at 8.2, depakote and lipase ok.  Pt does not have pcp.  She did note that she has been anemic and had blood transfusions before.  I told her we are going to refer to hematologist and to be on the look out for a call.  If not heard back to let us know in about a week.  She verbalized understanding.

## 2018-08-28 NOTE — Telephone Encounter (Signed)
No she needs a workup

## 2018-08-28 NOTE — Telephone Encounter (Signed)
-----   Message from Dennie Bible, NP sent at 08/26/2018  8:31 AM EDT ----- Discussed with Dr. Krista Blue hemoglobin is low at 8.2.  Depakote level okay.  Lipase okay.  We will refer patient to hematologist please set this up [facility].  Patient does not have a primary care provider

## 2018-08-29 ENCOUNTER — Encounter: Payer: Self-pay | Admitting: Hematology and Oncology

## 2018-08-29 ENCOUNTER — Telehealth: Payer: Self-pay | Admitting: Hematology and Oncology

## 2018-08-29 NOTE — Telephone Encounter (Signed)
Pt has been cld and scheduled to see Dr. Audelia Hives on 11/15 at 11am. Pt aware to arrive 30 minutes early. Letter mailed.

## 2018-09-12 ENCOUNTER — Encounter: Payer: Self-pay | Admitting: Hematology and Oncology

## 2018-09-12 ENCOUNTER — Inpatient Hospital Stay: Payer: Medicaid Other

## 2018-09-12 ENCOUNTER — Inpatient Hospital Stay: Payer: Medicaid Other | Attending: Hematology and Oncology | Admitting: Hematology and Oncology

## 2018-09-12 ENCOUNTER — Telehealth: Payer: Self-pay | Admitting: Hematology and Oncology

## 2018-09-12 VITALS — BP 121/71 | HR 87 | Temp 97.7°F | Resp 19 | Ht 64.0 in | Wt 160.6 lb

## 2018-09-12 DIAGNOSIS — F329 Major depressive disorder, single episode, unspecified: Secondary | ICD-10-CM | POA: Diagnosis not present

## 2018-09-12 DIAGNOSIS — D509 Iron deficiency anemia, unspecified: Secondary | ICD-10-CM

## 2018-09-12 DIAGNOSIS — Z79899 Other long term (current) drug therapy: Secondary | ICD-10-CM | POA: Insufficient documentation

## 2018-09-12 DIAGNOSIS — Z9884 Bariatric surgery status: Secondary | ICD-10-CM | POA: Diagnosis not present

## 2018-09-12 DIAGNOSIS — D539 Nutritional anemia, unspecified: Secondary | ICD-10-CM

## 2018-09-12 DIAGNOSIS — I10 Essential (primary) hypertension: Secondary | ICD-10-CM

## 2018-09-12 DIAGNOSIS — D649 Anemia, unspecified: Secondary | ICD-10-CM

## 2018-09-12 DIAGNOSIS — D500 Iron deficiency anemia secondary to blood loss (chronic): Secondary | ICD-10-CM

## 2018-09-12 DIAGNOSIS — F17210 Nicotine dependence, cigarettes, uncomplicated: Secondary | ICD-10-CM | POA: Diagnosis not present

## 2018-09-12 LAB — COMPREHENSIVE METABOLIC PANEL
ALT: 7 U/L (ref 0–44)
AST: 9 U/L — ABNORMAL LOW (ref 15–41)
Albumin: 3.6 g/dL (ref 3.5–5.0)
Alkaline Phosphatase: 66 U/L (ref 38–126)
Anion gap: 7 (ref 5–15)
BUN: 13 mg/dL (ref 6–20)
CHLORIDE: 107 mmol/L (ref 98–111)
CO2: 24 mmol/L (ref 22–32)
CREATININE: 0.74 mg/dL (ref 0.44–1.00)
Calcium: 8.5 mg/dL — ABNORMAL LOW (ref 8.9–10.3)
GFR calc Af Amer: >60 mL/min (ref >60)
Glucose, Bld: 79 mg/dL (ref 70–99)
POTASSIUM: 3.8 mmol/L (ref 3.5–5.1)
SODIUM: 138 mmol/L (ref 135–145)
Total Bilirubin: 0.5 mg/dL (ref 0.3–1.2)
Total Protein: 7.3 g/dL (ref 6.5–8.1)

## 2018-09-12 LAB — FERRITIN

## 2018-09-12 LAB — CBC WITH DIFFERENTIAL (CANCER CENTER ONLY)
ABS IMMATURE GRANULOCYTES: 0.02 10*3/uL (ref 0.00–0.07)
BASOS ABS: 0.1 10*3/uL (ref 0.0–0.1)
BASOS PCT: 1 %
Eosinophils Absolute: 0.5 10*3/uL (ref 0.0–0.5)
Eosinophils Relative: 6 %
HCT: 28.7 % — ABNORMAL LOW (ref 36.0–46.0)
HEMOGLOBIN: 8.1 g/dL — AB (ref 12.0–15.0)
IMMATURE GRANULOCYTES: 0 %
LYMPHS PCT: 25 %
Lymphs Abs: 1.9 10*3/uL (ref 0.7–4.0)
MCH: 19.8 pg — AB (ref 26.0–34.0)
MCHC: 28.2 g/dL — ABNORMAL LOW (ref 30.0–36.0)
MCV: 70 fL — ABNORMAL LOW (ref 80.0–100.0)
Monocytes Absolute: 0.6 10*3/uL (ref 0.1–1.0)
Monocytes Relative: 7 %
NEUTROS ABS: 4.7 10*3/uL (ref 1.7–7.7)
NEUTROS PCT: 61 %
PLATELETS: 467 10*3/uL — AB (ref 150–400)
RBC: 4.1 MIL/uL (ref 3.87–5.11)
RDW: 19.4 % — ABNORMAL HIGH (ref 11.5–15.5)
WBC: 7.7 10*3/uL (ref 4.0–10.5)
nRBC: 0 % (ref 0.0–0.2)

## 2018-09-12 LAB — IRON AND TIBC
Iron: 13 ug/dL — ABNORMAL LOW (ref 41–142)
Saturation Ratios: 3 % — ABNORMAL LOW (ref 21–57)
TIBC: 460 ug/dL — AB (ref 236–444)
UIBC: 447 ug/dL — AB (ref 120–384)

## 2018-09-12 LAB — RETICULOCYTES
IMMATURE RETIC FRACT: 18 % — AB (ref 2.3–15.9)
RBC.: 4.1 MIL/uL (ref 3.87–5.11)
RETIC CT PCT: 1 % (ref 0.4–3.1)
Retic Count, Absolute: 41.8 10*3/uL (ref 19.0–186.0)

## 2018-09-12 LAB — LACTATE DEHYDROGENASE: LDH: 113 U/L (ref 98–192)

## 2018-09-12 NOTE — Patient Instructions (Signed)
We discussed in detail your past history with anemia.  Because of your prior gastric bypass, you are at risk for multiple nutritional deficiencies including iron, folic acid, and vitamin B12.  Blood tests will be obtained today to rule out those deficiencies.  Blood work will also be done to rule out premature destruction of cells which can also accompany your prior surgery.  Barring any unforeseen complications, your next scheduled doctor visit to discuss those results he is on December 3.  Please do not hesitate to call should any new or untoward problems arise.  Thank you! Have a happy happy Thanksgiving! Ladona Ridgel, MD Hematology/Oncology

## 2018-09-12 NOTE — Telephone Encounter (Signed)
Gave pt avs and calendar  °

## 2018-09-13 LAB — HOMOCYSTEINE: HOMOCYSTEINE-NORM: 11.3 umol/L (ref 0.0–15.0)

## 2018-09-13 LAB — HAPTOGLOBIN: HAPTOGLOBIN: 143 mg/dL (ref 34–200)

## 2018-09-14 DIAGNOSIS — D500 Iron deficiency anemia secondary to blood loss (chronic): Secondary | ICD-10-CM | POA: Insufficient documentation

## 2018-09-14 NOTE — Progress Notes (Signed)
Outpatient Hematology/Oncology Initial Consultation  Patient Name:  Isabella Jenkins  DOB: 1976-01-06   Date of Service: September 12, 2018  Referring Provider: Cecille Rubin, CNP Marcial Pacas, MD   Consulting Physician: Henreitta Leber, MD Hematology/Oncology  Patient Care Team: Bobbye Charleston, MD as Consulting Physician (Obstetrics and Gynecology) Altamese Hope, MD as Consulting Physician (Orthopedic Surgery) Marcial Pacas, MD as Consulting Physician (Neurology)   Reason for Referral: In the setting of anemia over lengthy but indefinite period of time, likely secondary to iron deficiency, she presents now for further diagnostic and therapeutic recommendations.  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  She has no primary care physician.  She was referred by Marcial Pacas, neurology.  Her obstetrician is Dr. Bobbye Charleston.  She is alone at this first visit.  Over 5 years ago, a complete blood count showed hemoglobin 11.1 hematocrit 34.6 WBC 6.4; platelets 243,000.  On June 10, 2014: A complete blood count showed hemoglobin 10.1 hematocrit 31.4 MCV 82.8 MCH 26.6 WBC 12.3; platelets 288,000.  On January 20, 2015: A complete blood count showed hemoglobin 8.8 hematocrit 28.5 MCV 78.1 MCH 24.1 WBC 5.6; platelets 243,000.  On October 15, 2017: A complete blood count showed hemoglobin 8.8 hematocrit 31.3 MCV 74 MCH 20.9 WBC 5.0; platelets 341,000.  More recently, on August 25, 2018: A complete blood count showed hemoglobin 8.2 hematocrit 29.7 MCV 70 MCH 19.3 WBC 5.7; platelets  296,000.  Isabella Jenkins is a gravida 3 para 3.  Her menarche was at age 85 years.  Her menses have been regular generally lasting 3 days with moderate flow.  She was on oral contraception at the age of 54 but stopped after several years.  For the past 3 years she has had an intrauterine device.  She denies any recent appetite or weight loss.  She has no rash or itching.  She denies visual changes or hearing deficit.  She has no cough, sore throat, orthopnea.  She denies dyspnea either at rest or on exertion.  She has no pain or difficulty in swallowing.  No fever, shaking chills, sweats, or flulike symptoms are reported.    For the past year, she has had sporadic episodes of dyspepsia for which she takesTums on a regular basis.  There is no known peptic ulcer disease.  For obesity, she had a gastric bypass performed in 2010 while residing in [place], [place].  Although instructed, she never took multivitamins or supplements.  There is no viral hepatitis, inflammatory bowel disease, or diverticulosis.  She has no nausea, vomiting, diarrhea, or constipation.  She moves her bowels once daily.  She denies melena or bright red blood per rectum.  She has no known kidney or liver disorder.  No urinary frequency, urgency, hematuria, or dysuria are reported.  She has no rheumatoid or gouty arthritis.  She has no peripheral arterial venous thromboembolic disease.  She denies numbness or tingling in the fingers or toes.  It is with this background she presents now for further diagnostic and therapeutic recommendations in the setting of anemia as outlined above.  Past Medical History:  Diagnosis Date  . Ankle fracture   .  Anxiety   . Blood transfusion without reported diagnosis   . Gastric bypass status for obesity   . Hypertension   . Mood swings   . Seizures ([facility])    Last seizure 8/15   Past Surgical History:  Procedure Laterality Date  . ANKLE CLOSED REDUCTION Right 06/10/2014   Procedure: CLOSED REDUCTION  AND SPLINTING RIGHT TALUS FRACTURE DISLOCATION;  Surgeon: Rozanna Box, MD;  Location: [facility];  Service: Orthopedics;  Laterality: Right;  . CAST APPLICATION Right 7/41/2878   Procedure: CAST APPLICATION;  Surgeon: Rozanna Box, MD;  Location: [facility];  Service: Orthopedics;  Laterality: Right;  . EXTERNAL FIXATION LEG Left 06/10/2014   Procedure: EXTERNAL FIXATION LEFT PILON FRACTURE;  Surgeon: Rozanna Box, MD;  Location: [facility];  Service: Orthopedics;  Laterality: Left;  Marland Kitchen GASTRIC BYPASS    . I&D EXTREMITY Left 06/10/2014   Procedure: IRRIGATION AND DEBRIDEMENT LEFT OPEN FRACTURE; INCLUDING BONE;  Surgeon: Rozanna Box, MD;  Location: [facility];  Service: Orthopedics;  Laterality: Left;  . ORIF ANKLE FRACTURE Left 06/24/2014   Procedure: OPEN REDUCTION INTERNAL FIXATION (ORIF) TIBIA PILON  FRACTURE WITH REMOVAL OF EXTERNAL FIXATURE ;  Surgeon: Rozanna Box, MD;  Location: [facility];  Service: Orthopedics;  Laterality: Left;  . ORIF FIBULA FRACTURE Left 06/10/2014   Procedure: OPEN REDUCTION INTERNAL FIXATION (ORIF) PILON FRACTURE, FIBULA ONLY;  Surgeon: Rozanna Box, MD;  Location: [facility];  Service: Orthopedics;  Laterality: Left;  . WISDOM TOOTH EXTRACTION     Gynecologic History: Her menarche was at age 8 years. Her last menses was last month. She is been fairly regular with menstruation lasting 3 days. There is no menorrhagia or metromenorrhagia. She began oral contraception in her teens but stopped at the age of 41 years. For the past 3 years she has used an intrauterine device. She has never had a screening mammogram. She never had a breast biopsy. Her last Pap smear was less than 6 months ago.  Family History  Problem Relation Age of Onset  . High blood pressure Mother   . High Cholesterol Mother   . Stroke Father   . High blood pressure Father   Mother: Age 35 years: HTN/DJD Father: Age 44 years: ESRD/CVA/melanoma She has no brothers. Sisters (2): HTN  Social History     Socioeconomic History  . Marital status: Single    Spouse name: Not on file  . Number of children: 2  . Years of education: 16  . Highest education level: Not on file  Occupational History    Comment: [employer]  Social Needs  . Financial resource strain: Not on file  . Food insecurity:    Worry: Not on file    Inability: Not on file  . Transportation needs:    Medical: Not on file    Non-medical: Not on file  Tobacco Use  . Smoking status: Current Some Day Smoker    Types: Cigarettes  . Smokeless tobacco: Never Used  Substance and Sexual Activity  . Alcohol use: No  . Drug use: No  . Sexual activity: Yes  Lifestyle  . Physical activity:    Days per week: Not on file    Minutes per session: Not on file  . Stress: Not on file  Relationships  . Social connections:    Talks on phone: Not on file    Gets together: Not on file    Attends religious service: Not on file    Active  member of club or organization: Not on file    Attends meetings of clubs or organizations: Not on file    Relationship status: Not on file  . Intimate partner violence:    Fear of current or ex partner: Not on file    Emotionally abused: Not on file    Physically abused: Not on file    Forced sexual activity: Not on file  Other Topics Concern  . Not on file  Social History Narrative   Patient lives at home with her children . Patient is divorcing. Patient works full time at [employer].    Education high school.   Right handed.   Caffeine one cup of coffee daily.  Latayvia Mandujano is divorced. She works at [employer]. She has 3 healthy children. She began smoking at the age of 57 years. She smoked a maximum <1/2 pack cigarettes daily She currently smokes <1/2 pack of cigarettes daily Her alcohol intake consists of beer on a daily basis She sometimes drinks vodka She reports no recreational drug use   Transfusion History: She was transfused 4 years ago  Exposure History: She has no  known exposure to toxic chemicals, radiation, or pesticides  Allergies  Allergen Reactions  . Ibuprofen Other (See Comments)    Can't take due to gastric surgery.   . Tramadol Other (See Comments)    Can't take this medication with seizure medication.   . Flexeril [Cyclobenzaprine] Anxiety  She has no food or seasonal allergies  Current Outpatient Medications on File Prior to Visit  Medication Sig  . acetaminophen (TYLENOL) 500 MG tablet Take 1,000-1,500 mg by mouth every 6 (six) hours as needed for moderate pain or headache.  . divalproex (DEPAKOTE ER) 500 MG 24 hr tablet TAKE 4 TABLETS (2,000 MG TOTAL) BY MOUTH DAILY.  [name] 0.35 MG tablet TAKE 1 TABLET(S) EVERY DAY BY ORAL ROUTE.  Marland Kitchen escitalopram (LEXAPRO) 10 MG tablet Take 1 tablet (10 mg total) by mouth daily. (Patient not taking: Reported on 09/12/2018)   No current facility-administered medications on file prior to visit.     Review of Systems: Constitutional: No fever, sweats, or shaking chills.  No appetite or weight deficit. Skin: No rash, scaling, sores, lumps, or jaundice. HEENT: No visual changes or hearing deficit. Pulmonary: No unusual cough, sore throat, or orthopnea; active smoker. Breasts: No complaints. Cardiovascular: No coronary artery disease, angina, or myocardial infarction.  No cardiac dysrhythmia.   Primary essential hypertension; no dyslipidemia. Gastrointestinal: No indigestion, dysphagia, abdominal pain, diarrhea, or constipation.  No change in bowel habits.  No melena or bright red blood per rectum. No nausea or vomiting. Gastric bypass in 2010.  Bleeding hemorrhoids in 2015. Genitourinary: No urinary frequency, urgency, hematuria, or dysuria; menses every 28-30 days. Musculoskeletal: No arthralgias or myalgias; no joint swelling, pain, or instability. Hematologic: No bleeding tendency or easy bruisability. Endocrine: No intolerance to hot or cold; no thyroid disease or diabetes mellitus. Vascular: No  peripheral arterial or venous thromboembolic disease. Psychological: Anxiety disorder; chronic depression; mood changes; alcohol and tobacco dependence. 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  Physical Examination: Vital Signs: Body surface area is 1.81 meters squared.  Vitals:   09/12/18 1100  BP: 121/71  Pulse: 87  Resp: 19  Temp: 97.7 F (36.5 C)  SpO2: 100%    Filed Weights   09/12/18  1100  Weight: 160 lb 9.6 oz (72.8 kg)  ECOG PERFORMANCE STATUS: 0 Constitutional:  Kimmy Totten is fully nourished and developed.  She looks age appropriate.  She is friendly and cooperative without respiratory compromise at rest. Skin: No rashes, scaling, dryness, jaundice, or itching. HEENT: Head is normocephalic and atraumatic.  Pupils are equal round and reactive to light and accommodation.  Sclerae are anicteric.  Conjunctivae are pale.  No sinus tenderness nor oropharyngeal lesions.  Lips without cracking or peeling; tongue without mass, inflammation, or nodularity.  Mucous membranes are moist. Neck: Supple and symmetric.  No jugular venous distention or thyromegaly.  Trachea is midline. Lymphatics: No cervical or supraclavicular lymphadenopathy.  No epitrochlear, axillary, or inguinal lymphadenopathy is appreciated. Respiratory/chest: Thorax is symmetrical.  Breath sounds are clear to auscultation and percussion.  Normal excursion and respiratory effort. Back: Symmetric without deformity or tenderness. Cardiovascular: Heart rate and rhythm are regular without murmurs. Gastrointestinal: Abdomen is soft, nontender; no organomegaly.  Bowel sounds are normoactive.  No masses are appreciated. Extremities: In the lower extremities, there is no asymmetric swelling, erythema, tenderness, or cord formation.  No clubbing, cyanosis, nor  edema. Hematologic: No petechiae, hematomas, or ecchymoses. Psychological:  She is oriented to person, place, and time; normal affect. Neurological: There are no gross neurologic deficits.  Laboratory Results: I have reviewed the data as listed: September 12, 2018  Ref Range & Units 2d ago 2wk ago 83mo ago 48yr ago 46yr ago  WBC Count 4.0 - 10.5 K/uL 7.7  5.4 R 5.0 R 5.9 R 6.2   RBC 3.87 - 5.11 MIL/uL 4.10  4.24 R 4.21 R 3.95 R 3.37Low    Hemoglobin 12.0 - 15.0 g/dL 8.1Low   8.2Low  R 8.8Low  R 6.9Low Panic  R 6.3Low Panic  CM  Comment: Reticulocyte Hemoglobin testing  may be clinically indicated,  consider ordering this additional  test [id number]   HCT 36.0 - 46.0 % 28.7Low   29.7Low  R 31.3Low  R 24.6Low  R 22.9Low    MCV 80.0 - 100.0 fL 70.0Low   70Low  R 74Low  R 62Low  R 68.0Low  R  MCH 26.0 - 34.0 pg 19.8Low   19.3Low  R 20.9Low  R 17.5Low  R 18.7Low    MCHC 30.0 - 36.0 g/dL 28.2Low   27.6Low  R 28.1Low  R 28.0Low  R 27.5Low    RDW 11.5 - 15.5 % 19.4High   16.9High  R 18.1High  R 19.5High  R 18.0High    Platelet Count 150 - 400 K/uL 467High   296 R 341 R 344 R 348   nRBC 0.0 - 0.2 % 0.0       Neutrophils Relative % % 61  47 R 48 R 46 R 55   Neutro Abs 1.7 - 7.7 K/uL 4.7  2.5 R 2.4 R 2.7 R 3.4   Lymphocytes Relative % 25     35   Lymphs Abs 0.7 - 4.0 K/uL 1.9  2.0 R 2.0 R 2.4 R 2.2   Monocytes Relative % 7     7   Monocytes Absolute 0.1 - 1.0 K/uL 0.6     0.4   Eosinophils Relative % 6     2   Eosinophils Absolute 0.0 - 0.5 K/uL 0.5     0.1 R  Basophils Relative % 1     1   Basophils Absolute 0.0 - 0.1 K/uL 0.1  0.1 R 0.1 R 0.0 R 0.1  Immature Granulocytes % 0  0 R 0 R 0 R   Abs Immature Granulocytes 0.00 - 0.07 K/uL 0.02            CMP Latest Ref Rng & Units 09/12/2018 08/25/2018 10/15/2017  Glucose 70 - 99 mg/dL 79 48(L) 84  BUN 6 - 20 mg/dL 13 13 15   Creatinine 0.44 - 1.00 mg/dL 0.74 0.69 0.61  Sodium 135 - 145 mmol/L 138 138 140  Potassium 3.5 - 5.1 mmol/L 3.8 4.1 4.2   Chloride 98 - 111 mmol/L 107 106 104  CO2 22 - 32 mmol/L 24 18(L) 26  Calcium 8.9 - 10.3 mg/dL 8.5(L) 8.8 8.5(L)  Total Protein 6.5 - 8.1 g/dL 7.3 6.7 6.3  Total Bilirubin 0.3 - 1.2 mg/dL 0.5 0.4 0.3  Alkaline Phos 38 - 126 U/L 66 60 60  AST 15 - 41 U/L 9(L) 8 10  ALT 0 - 44 U/L 7 5 9   Reticulocyte count 1.0 Haptoglobin 143 Homocysteine 11.3 Ferritin <4 Iron/TIBC 13/460 Iron saturation 3%  Summary/Assessment: In the setting of anemia over lengthy but indefinite period of time, likely secondary to iron deficiency, she presents now for further diagnostic and therapeutic recommendations.  Over 5 years ago, a complete blood count showed hemoglobin 11.1 hematocrit 34.6 WBC 6.4; platelets 243,000.  On June 10, 2014: A complete blood count showed hemoglobin 10.1 hematocrit 31.4 MCV 82.8 MCH 26.6 WBC 12.3; platelets 288,000.  On January 20, 2015: A complete blood count showed hemoglobin 8.8 hematocrit 28.5 MCV 78.1 MCH 24.1 WBC 5.6; platelets 243,000.  On October 15, 2017: A complete blood count showed hemoglobin 8.8 hematocrit 31.3 MCV 74 MCH 20.9 WBC 5.0; platelets 341,000.  More recently, on August 25, 2018: A complete blood count showed hemoglobin 8.2 hematocrit 29.7 MCV 70 MCH 19.3 WBC 5.7; platelets 296,000.  Isabella Jenkins is a gravida 3 para 3.  Her menarche was at age 56 years.  Her menses have been regular generally lasting 3 days with moderate flow.  She was on oral contraception in her teens but stopped at the age of 33 years.  For the past 3 years she has had an intrauterine device.  She denies any recent appetite or weight loss.  She has no rash or itching.  She denies visual changes or hearing deficit.  She has no cough, sore throat, orthopnea.  She denies dyspnea either at rest or on exertion.  She has no pain or difficulty in swallowing.  No fever, shaking chills, sweats, or flulike symptoms are reported.    For the past year, she has had sporadic episodes of dyspepsia for which she  takesTums on a regular basis.  There is no known peptic ulcer disease. For obesity, she had a gastric bypass performed in 2010 while residing in [place], [place].  Although instructed, she never took multivitamins or supplements.  There is no viral hepatitis, inflammatory bowel disease, or diverticulosis.  She has no nausea, vomiting, diarrhea, or constipation.  She moves her bowels once daily.  She denies melena or bright red blood per rectum.  She has no known kidney or liver disorder.  No urinary frequency, urgency, hematuria, or dysuria are reported.  She has no rheumatoid or gouty arthritis.  She has no peripheral arterial or venous thromboembolic disease.  She denies numbness or tingling in the fingers or toes.  Her other comorbid problems include anxiety disorder, gastric bypass surgery in 2010 while residing in [place], [place]; gastroesophageal reflux disease; a long-standing history of  grand mal seizures since the age of 79 years (last seizure 4 years ago); primary hypertension; chronic depression; bleeding hemorrhoids in 2015; active tobacco dependence; and mood swings.  Select Homocysteine  Recommendation/Plan: The results of her laboratory studies were not available at the time of discharge.  They will be discussed in detail the time of her next visit.  Laboratory studies were obtained to exclude an underlying iron deficiency, nutritional deficiency, hemolytic process, or hemoglobinopathy.  Those preliminary results suggest iron deficiency.  Because of her previous gastric bypass and daily alcohol use, there may be a component of vitamin N46 or folic acid deficiency.  Those results are pending.  Barring any unforeseen complications, her next scheduled doctor visit to discuss those results is on September 30, 2018  Angeleah was advised to call us in the interim should any new or untoward problems arise.  The total time spent discussing her previous laboratory studies, rationale and  methodology for evaluating anemia with recommendations was 50 minutes.  At least 50% of that time was spent in face-to-face discussion, reviewing outside records, laboratory studies, counseling, and answering questions.  There was ample time allotted to answer all questions.  This note was dictated using voice activated technology/software.  Unfortunately, typographical errors are not uncommon, and transcription is subject to mistakes and regrettably misinterpretation.  If necessary, clarification of the above information can be discussed with me at any time.  Thank you Dr. Aliene Beams and Hoyle Sauer for allowing my participation in the care of [facility]. I will keep you closely informed as the results of her preliminary laboratory data become available.  Please do not hesitate to call should any questions arise regarding this initial consultation and discussion.  FOLLOW UP: AS DIRECTED   cc:            Cecille Rubin, CNP                  Marcial Pacas, MD          Henreitta Leber, MD  Hematology/Oncology [facility] [address] [place], [place] [address] Office: [phone] BZJI: [phone]

## 2018-09-15 LAB — METHYLMALONIC ACID, SERUM: METHYLMALONIC ACID, QUANTITATIVE: 125 nmol/L (ref 0–378)

## 2018-09-18 LAB — HEMOGLOBINOPATHY EVALUATION
HGB A: 98.3 % (ref 96.4–98.8)
HGB F QUANT: 0 % (ref 0.0–2.0)
HGB S QUANTITAION: 0 %
HGB VARIANT: 0 %
Hgb A2 Quant: 1.7 % — ABNORMAL LOW (ref 1.8–3.2)
Hgb C: 0 %

## 2018-09-21 ENCOUNTER — Other Ambulatory Visit: Payer: Self-pay | Admitting: Nurse Practitioner

## 2018-09-28 HISTORY — PX: DILATION AND CURETTAGE OF UTERUS: SHX78

## 2018-09-29 ENCOUNTER — Telehealth: Payer: Self-pay | Admitting: Hematology and Oncology

## 2018-09-29 NOTE — Telephone Encounter (Signed)
Patient called to reschedule  °

## 2018-09-30 ENCOUNTER — Inpatient Hospital Stay: Payer: Medicaid Other | Admitting: Hematology and Oncology

## 2018-10-07 ENCOUNTER — Telehealth: Payer: Self-pay | Admitting: Hematology and Oncology

## 2018-10-07 NOTE — Telephone Encounter (Signed)
R/s appt from 12/11 due to provider leaving office - pt is aware of new appt date and time

## 2018-10-08 ENCOUNTER — Inpatient Hospital Stay: Payer: Medicaid Other | Admitting: Hematology and Oncology

## 2018-10-14 ENCOUNTER — Inpatient Hospital Stay: Payer: Medicaid Other | Attending: Hematology and Oncology | Admitting: Hematology

## 2019-04-23 ENCOUNTER — Emergency Department (HOSPITAL_COMMUNITY)
Admission: EM | Admit: 2019-04-23 | Discharge: 2019-04-23 | Disposition: A | Discharge status: Home / Self Care | Payer: Medicaid Other | Attending: Emergency Medicine | Admitting: Emergency Medicine

## 2019-04-23 ENCOUNTER — Encounter (HOSPITAL_COMMUNITY): Payer: Self-pay

## 2019-04-23 ENCOUNTER — Other Ambulatory Visit: Payer: Self-pay

## 2019-04-23 DIAGNOSIS — R1013 Epigastric pain: Secondary | ICD-10-CM | POA: Insufficient documentation

## 2019-04-23 DIAGNOSIS — Z9884 Bariatric surgery status: Secondary | ICD-10-CM | POA: Diagnosis not present

## 2019-04-23 DIAGNOSIS — I10 Essential (primary) hypertension: Secondary | ICD-10-CM | POA: Insufficient documentation

## 2019-04-23 DIAGNOSIS — F17210 Nicotine dependence, cigarettes, uncomplicated: Secondary | ICD-10-CM | POA: Insufficient documentation

## 2019-04-23 LAB — CBC
HCT: 30.5 % — ABNORMAL LOW (ref 36.0–46.0)
Hemoglobin: 8.8 g/dL — ABNORMAL LOW (ref 12.0–15.0)
MCH: 20.7 pg — ABNORMAL LOW (ref 26.0–34.0)
MCHC: 28.9 g/dL — ABNORMAL LOW (ref 30.0–36.0)
MCV: 71.6 fL — ABNORMAL LOW (ref 80.0–100.0)
Platelets: 341 10*3/uL (ref 150–400)
RBC: 4.26 MIL/uL (ref 3.87–5.11)
RDW: 18.9 % — ABNORMAL HIGH (ref 11.5–15.5)
WBC: 8.4 10*3/uL (ref 4.0–10.5)
nRBC: 0 % (ref 0.0–0.2)

## 2019-04-23 LAB — COMPREHENSIVE METABOLIC PANEL
ALT: 14 U/L (ref 0–44)
AST: 18 U/L (ref 15–41)
Albumin: 3.6 g/dL (ref 3.5–5.0)
Alkaline Phosphatase: 45 U/L (ref 38–126)
Anion gap: 9 (ref 5–15)
BUN: 19 mg/dL (ref 6–20)
CO2: 22 mmol/L (ref 22–32)
Calcium: 8.9 mg/dL (ref 8.9–10.3)
Chloride: 105 mmol/L (ref 98–111)
Creatinine, Ser: 0.89 mg/dL (ref 0.44–1.00)
GFR calc Af Amer: >60 mL/min (ref >60)
GFR calc non Af Amer: >60 mL/min (ref >60)
Glucose, Bld: 86 mg/dL (ref 70–99)
Potassium: 3.9 mmol/L (ref 3.5–5.1)
Sodium: 136 mmol/L (ref 135–145)
Total Bilirubin: 0.8 mg/dL (ref 0.3–1.2)
Total Protein: 7.1 g/dL (ref 6.5–8.1)

## 2019-04-23 LAB — LIPASE, BLOOD: Lipase: 29 U/L (ref 11–51)

## 2019-04-23 MED ORDER — SUCRALFATE 1 G PO TABS: 1.0000 g | ORAL_TABLET | Freq: Four times a day (QID) | ORAL | 0 refills | Status: DC | PRN

## 2019-04-23 MED ORDER — ALUM & MAG HYDROXIDE-SIMETH 200-200-20 MG/5ML PO SUSP
30.0000 mL | Freq: Once | ORAL | Status: AC
  Administered 2019-04-23: 30 mL via ORAL
  Filled 2019-04-23: qty 30

## 2019-04-23 MED ORDER — LIDOCAINE VISCOUS HCL 2 % MT SOLN
15.0000 mL | Freq: Once | OROMUCOSAL | Status: AC
  Administered 2019-04-23: 15 mL via ORAL
  Filled 2019-04-23: qty 15

## 2019-04-23 MED ORDER — OMEPRAZOLE 20 MG PO CPDR: 20.0000 mg | DELAYED_RELEASE_CAPSULE | Freq: Every day | ORAL | 1 refills | Status: DC

## 2019-04-23 NOTE — ED Triage Notes (Addendum)
Pt had gastric bypass 10 years ago, about two years ago her upper abd began having sharp pains making her feel nauseated. It lasts until she takes a Tums and then it will ease off but can be triggered by her anxiety & if she stands up to move around. She feels that it could be related to her bypass since she's not had it checked since it was done. She also denies food triggering any of her symptoms.

## 2019-04-23 NOTE — ED Provider Notes (Signed)
[facility] Emergency Department Provider Note MRN:  [id number]  Arrival date & time: 04/23/19     Chief Complaint   Abdominal Pain   History of Present Illness   Isabella Jenkins is a 43 y.o. year-old female with a history of gastric bypass presenting to the ED with chief complaint of abdominal pain.  Pain is been present for years, occurs intermittently.  Located in the epigastrium, nonradiating, moderate in severity, here today because she is tired of having to take Tums almost every day to help with the pain.  Denies fever, no vomiting, mild nausea, no chest pain or shortness of breath, no lower abdominal pain, no fever.  No other exacerbating or alleviating factors.  Review of Systems  A complete 10 system review of systems was obtained and all systems are negative except as noted in the HPI and PMH.   Patient's Health History    Past Medical History:  Diagnosis Date  . Ankle fracture   . Anxiety   . Blood transfusion without reported diagnosis   . Gastric bypass status for obesity   . Hypertension   . Mood swings   . Seizures ([place])    Last seizure 8/15    Past Surgical History:  Procedure Laterality Date  . ANKLE CLOSED REDUCTION Right 06/10/2014   Procedure: CLOSED REDUCTION AND SPLINTING RIGHT TALUS FRACTURE DISLOCATION;  Surgeon: Rozanna Box, MD;  Location: [place];  Service: Orthopedics;  Laterality: Right;  . CAST APPLICATION Right 5/85/2778   Procedure: CAST APPLICATION;  Surgeon: Rozanna Box, MD;  Location: [place];  Service: Orthopedics;  Laterality: Right;  . EXTERNAL FIXATION LEG Left 06/10/2014   Procedure: EXTERNAL FIXATION LEFT PILON FRACTURE;  Surgeon: Rozanna Box, MD;  Location: [place];  Service: Orthopedics;  Laterality: Left;  Marland Kitchen GASTRIC BYPASS    . I&D EXTREMITY Left 06/10/2014   Procedure: IRRIGATION AND DEBRIDEMENT LEFT OPEN FRACTURE; INCLUDING BONE;  Surgeon: Rozanna Box, MD;  Location: [place];  Service: Orthopedics;   Laterality: Left;  . ORIF ANKLE FRACTURE Left 06/24/2014   Procedure: OPEN REDUCTION INTERNAL FIXATION (ORIF) TIBIA PILON  FRACTURE WITH REMOVAL OF EXTERNAL FIXATURE ;  Surgeon: Rozanna Box, MD;  Location: [place];  Service: Orthopedics;  Laterality: Left;  . ORIF FIBULA FRACTURE Left 06/10/2014   Procedure: OPEN REDUCTION INTERNAL FIXATION (ORIF) PILON FRACTURE, FIBULA ONLY;  Surgeon: Rozanna Box, MD;  Location: [place];  Service: Orthopedics;  Laterality: Left;  . WISDOM TOOTH EXTRACTION      Family History  Problem Relation Age of Onset  . High blood pressure Mother   . High Cholesterol Mother   . Stroke Father   . High blood pressure Father     Social History   Socioeconomic History  . Marital status: Single    Spouse name: Not on file  . Number of children: 2  . Years of education: 75  . Highest education level: Not on file  Occupational History    Comment: [employer]  Social Needs  . Financial resource strain: Not on file  . Food insecurity    Worry: Not on file    Inability: Not on file  . Transportation needs    Medical: Not on file    Non-medical: Not on file  Tobacco Use  . Smoking status: Current Some Day Smoker    Types: Cigarettes  . Smokeless tobacco: Never Used  Substance and Sexual Activity  . Alcohol use: No  .  Drug use: No  . Sexual activity: Yes  Lifestyle  . Physical activity    Days per week: Not on file    Minutes per session: Not on file  . Stress: Not on file  Relationships  . Social Herbalist on phone: Not on file    Gets together: Not on file    Attends religious service: Not on file    Active member of club or organization: Not on file    Attends meetings of clubs or organizations: Not on file    Relationship status: Not on file  . Intimate partner violence    Fear of current or ex partner: Not on file    Emotionally abused: Not on file    Physically abused: Not on file    Forced sexual activity: Not on file  Other  Topics Concern  . Not on file  Social History Narrative   Patient lives at home with her children . Patient is divorcing. Patient works full time at [employer].    Education high school.   Right handed.   Caffeine one cup of coffee daily.     Physical Exam  Vital Signs and Nursing Notes reviewed Vitals:   04/23/19 1330 04/23/19 1400  BP: (!) 145/98 (!) 139/96  Pulse:  69  Resp:  18  Temp:    SpO2:  100%    CONSTITUTIONAL: Well-appearing, NAD NEURO:  Alert and oriented x 3, no focal deficits EYES:  eyes equal and reactive ENT/NECK:  no LAD, no JVD CARDIO: Regular rate, well-perfused, normal S1 and S2 PULM:  CTAB no wheezing or rhonchi GI/GU:  normal bowel sounds, non-distended, non-tender MSK/SPINE:  No gross deformities, no edema SKIN:  no rash, atraumatic PSYCH:  Appropriate speech and behavior  Diagnostic and Interventional Summary    Labs Reviewed  CBC - Abnormal; Notable for the following components:      Result Value   Hemoglobin 8.8 (*)    HCT 30.5 (*)    MCV 71.6 (*)    MCH 20.7 (*)    MCHC 28.9 (*)    RDW 18.9 (*)    All other components within normal limits  COMPREHENSIVE METABOLIC PANEL  LIPASE, BLOOD    No orders to display    Medications  alum & mag hydroxide-simeth (MAALOX/MYLANTA) 200-200-20 MG/5ML suspension 30 mL (30 mLs Oral Given 04/23/19 1400)    And  lidocaine (XYLOCAINE) 2 % viscous mouth solution 15 mL (15 mLs Oral Given 04/23/19 1400)     Procedures Critical Care  ED Course and Medical Decision Making  I have reviewed the triage vital signs and the nursing notes.  Pertinent labs & imaging results that were available during my care of the patient were reviewed by me and considered in my medical decision making (see below for details).  Suspect GERD given that this is epigastric pain that improves with Tums at home.  Patient has normal vital signs, benign abdomen.  She does have a history of gastric bypass 10 years ago.  Will screen  labs, provide GI cocktail.  If labs are reassuring and patient's pain is improved, we should be able to avoid CT imaging today and provide her with a more robust GERD medication regimen.  Labs are reassuring, patient feeling better after GI cocktail, appropriate for discharge.  After the discussed management above, the patient was determined to be safe for discharge.  The patient was in agreement with this plan and all questions regarding their  care were answered.  ED return precautions were discussed and the patient will return to the ED with any significant worsening of condition.  Barth Kirks. Sedonia Small, MD [place] [email or web address]  Final Clinical Impressions(s) / ED Diagnoses     ICD-10-CM   1. Epigastric pain  R10.13     ED Discharge Orders         Ordered    omeprazole (PRILOSEC) 20 MG capsule  Daily     04/23/19 1407    sucralfate (CARAFATE) 1 g tablet  4 times daily PRN     04/23/19 1407             Maudie Flakes, MD 04/23/19 1912

## 2019-04-23 NOTE — Discharge Instructions (Addendum)
You were evaluated in the Emergency Department and after careful evaluation, we did not find any emergent condition requiring admission or further testing in the hospital.  Your symptoms today seem to be due to pain related to acid in the stomach, possibly a small ulcer.  Your testing today was overall reassuring.  If you have a small ulcer, it is likely that we can heal it with medications.  Please use the omeprazole and Carafate medications as directed.  We recommend follow-up with a primary care doctor to discuss the symptoms as well as discuss your low iron and anemia.  Please return to the Emergency Department if you experience any worsening of your condition.  We encourage you to follow up with a primary care provider.  Thank you for allowing [place] to be a part of your care.

## 2019-04-23 NOTE — ED Notes (Signed)
Pt verbalized understanding of discharge paperwork, prescriptions and follow-up care 

## 2019-06-30 ENCOUNTER — Ambulatory Visit: Payer: Medicaid Other | Admitting: Family Medicine

## 2019-06-30 ENCOUNTER — Encounter: Payer: Self-pay | Admitting: Gastroenterology

## 2019-06-30 ENCOUNTER — Encounter: Payer: Self-pay | Admitting: Family Medicine

## 2019-06-30 ENCOUNTER — Other Ambulatory Visit: Payer: Self-pay

## 2019-06-30 VITALS — BP 136/68 | HR 102 | Wt 164.8 lb

## 2019-06-30 DIAGNOSIS — K219 Gastro-esophageal reflux disease without esophagitis: Secondary | ICD-10-CM | POA: Diagnosis not present

## 2019-06-30 DIAGNOSIS — Z720 Tobacco use: Secondary | ICD-10-CM | POA: Diagnosis not present

## 2019-06-30 DIAGNOSIS — D500 Iron deficiency anemia secondary to blood loss (chronic): Secondary | ICD-10-CM

## 2019-06-30 DIAGNOSIS — Z309 Encounter for contraceptive management, unspecified: Secondary | ICD-10-CM | POA: Diagnosis not present

## 2019-06-30 DIAGNOSIS — Z9884 Bariatric surgery status: Secondary | ICD-10-CM | POA: Diagnosis not present

## 2019-06-30 DIAGNOSIS — R2231 Localized swelling, mass and lump, right upper limb: Secondary | ICD-10-CM | POA: Diagnosis not present

## 2019-06-30 DIAGNOSIS — Z0000 Encounter for general adult medical examination without abnormal findings: Secondary | ICD-10-CM | POA: Diagnosis present

## 2019-06-30 MED ORDER — NORETHIN ACE-ETH ESTRAD-FE 1-20 MG-MCG PO TABS: 1.0000 | ORAL_TABLET | Freq: Every day | ORAL | 11 refills | Status: DC

## 2019-06-30 MED ORDER — IRON (FERROUS SULFATE) 325 (65 FE) MG PO TABS: 1.0000 | ORAL_TABLET | ORAL | 3 refills | Status: DC

## 2019-06-30 MED ORDER — OMEPRAZOLE 20 MG PO CPDR: 20.0000 mg | DELAYED_RELEASE_CAPSULE | Freq: Every day | ORAL | 3 refills | Status: DC

## 2019-06-30 NOTE — Assessment & Plan Note (Signed)
Low iron, high TIBC, low ferritin, upon check in November 2019, indicating iron deficiency anemia.  Patient did not follow-up with hematology, therefore given they had started work-up, recommend patient return to them for further work-up.  Patient also advised to use iron every other day for better GI tolerance.  Will obtain CBC today.  Given patient's history of gastric bypass surgery and no history of GI work-up, will also refer to GI, as could likely be a cause of patient's iron deficiency anemia.  We will also order celiac panel to rule out celiac disease as cause.

## 2019-06-30 NOTE — Patient Instructions (Addendum)
Thank you for coming to see me today. It was a pleasure. Today we talked about:   Your abdominal pain: I have refilled your medication.  Your anemia: We will check labs.  Call the hematology office you went to previously and make another appointment for follow up.  Take iron every other day.  I will also check for celiac disease markers and refer you to gastroenterology to assess if your gastric bypass is contributing to your anemia.  Your ultrasound: we will give you a date a time for this.  Depending on the results, we will likely refer you to surgery.  Call 1800-QUIT-NOW for help with stopping smoking. They can assist with free resources such as patches, check-in calls, and counseling.   Please follow-up with me in 1 month.  If you have any questions or concerns, please do not hesitate to call the office at [phone].  Best,   [place] Constable, DO

## 2019-06-30 NOTE — Assessment & Plan Note (Signed)
Symptoms appear well controlled with omeprazole.  Will continue omeprazole 20 mg daily.  Patient also with referral to GI for iron deficiency anemia.
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
## 2019-06-30 NOTE — Assessment & Plan Note (Signed)
Past medical, surgical, social history, medications, new patient packet reviewed with patient.  All appropriate findings in epic updated.  Problem list updated.

## 2019-07-01 ENCOUNTER — Telehealth: Payer: Self-pay

## 2019-07-01 NOTE — Telephone Encounter (Signed)
-----   Message from Cleophas Dunker, DO sent at 07/01/2019 11:32 AM EDT ----- Please inform patient that her hemoglobin is stable and there is no change to the plan from yesterday.  She should still call the hematologist to make an appointment and she take iron every other day.  Her Hep C and HIV were also negative.  We are still waiting on the Celiac Disease labs to come back (will likely take a few days).

## 2019-07-01 NOTE — Assessment & Plan Note (Signed)
Likely contributing to patient's iron deficiency anemia.  Plan per above.

## 2019-07-01 NOTE — Telephone Encounter (Signed)
Spoke with and informed pt of the information below. Ottis Stain, CMA

## 2019-07-01 NOTE — Assessment & Plan Note (Signed)
Exam consistent with likely lipoma.  Will obtain ultrasound to further visualize.  It is proximal to region of epitrochlear lymph node therefore do not believe this is a lymph node.  Pending ultrasound results, will refer to general surgery.

## 2019-07-01 NOTE — Assessment & Plan Note (Signed)
Discussed with patient she has been thinking about quitting smoking.  She says that she would like to do this on her own.  She was given information about the quit line and advised to come back if she would like to discuss options for helping her quit smoking.  She was also encouraged to decrease her alcohol intake.

## 2019-07-02 ENCOUNTER — Telehealth: Payer: Self-pay | Admitting: *Deleted

## 2019-07-02 LAB — CBC
Hematocrit: 29.8 % — ABNORMAL LOW (ref 34.0–46.6)
Hemoglobin: 8.4 g/dL — ABNORMAL LOW (ref 11.1–15.9)
MCH: 20.2 pg — ABNORMAL LOW (ref 26.6–33.0)
MCHC: 28.2 g/dL — ABNORMAL LOW (ref 31.5–35.7)
MCV: 72 fL — ABNORMAL LOW (ref 79–97)
Platelets: 411 10*3/uL (ref 150–450)
RBC: 4.16 x10E6/uL (ref 3.77–5.28)
RDW: 17.6 % — ABNORMAL HIGH (ref 11.7–15.4)
WBC: 7.7 10*3/uL (ref 3.4–10.8)

## 2019-07-02 LAB — HEPATITIS C ANTIBODY: Hep C Virus Ab: <0.1 s/co ratio (ref 0.0–0.9)

## 2019-07-02 LAB — HIV ANTIBODY (ROUTINE TESTING W REFLEX): HIV Screen 4th Generation wRfx: NONREACTIVE

## 2019-07-02 LAB — CELIAC DISEASE COMPREHENSIVE PANEL WITH REFLEXES
IgA/Immunoglobulin A, Serum: 300 mg/dL (ref 87–352)
Transglutaminase IgA: <2 U/mL (ref 0–3)

## 2019-07-02 NOTE — Telephone Encounter (Signed)
Pt informed.Isabella Jenkins, CMA ° °

## 2019-07-02 NOTE — Telephone Encounter (Signed)
-----   Message from Cleophas Dunker, DO sent at 07/02/2019  8:45 AM EDT ----- Celiac test negative.  If you have not called patient yet for other labs, please include this when you do.  Thanks!

## 2019-07-03 ENCOUNTER — Ambulatory Visit
Admission: RE | Admit: 2019-07-03 | Discharge: 2019-07-03 | Disposition: A | Discharge status: Home / Self Care | Payer: Medicaid Other | Source: Ambulatory Visit | Attending: Family Medicine | Admitting: Family Medicine

## 2019-07-03 DIAGNOSIS — R2231 Localized swelling, mass and lump, right upper limb: Secondary | ICD-10-CM

## 2019-07-07 ENCOUNTER — Telehealth: Payer: Self-pay | Admitting: Family Medicine

## 2019-07-07 DIAGNOSIS — D1721 Benign lipomatous neoplasm of skin and subcutaneous tissue of right arm: Secondary | ICD-10-CM

## 2019-07-07 NOTE — Telephone Encounter (Signed)
No answer.  VM full and unable to leave message.  Attempted to call regarding [place] results.  Appears to be lipoma.  Will place referral to generally surgery.  Please advise her if she calls back that if their office does not contact her in the next 2 weeks, to give [place] a call.

## 2019-07-09 ENCOUNTER — Telehealth: Payer: Self-pay | Admitting: Nurse Practitioner

## 2019-07-09 ENCOUNTER — Ambulatory Visit: Payer: Medicaid Other | Admitting: Gastroenterology

## 2019-07-09 NOTE — Telephone Encounter (Signed)
Received a new hem referral for IDA. MS. Latney has been cld and scheduled to see Cira Rue on 10/5 at 1:45pm. She's been made aware to arrive 15 minutes eary.

## 2019-08-03 ENCOUNTER — Other Ambulatory Visit: Payer: Self-pay

## 2019-08-03 ENCOUNTER — Inpatient Hospital Stay: Payer: Medicaid Other | Attending: Nurse Practitioner | Admitting: Nurse Practitioner

## 2019-08-03 ENCOUNTER — Inpatient Hospital Stay: Payer: Medicaid Other

## 2019-08-03 VITALS — BP 151/90 | HR 83 | Temp 97.4°F | Resp 18 | Wt 171.1 lb

## 2019-08-03 DIAGNOSIS — D509 Iron deficiency anemia, unspecified: Secondary | ICD-10-CM

## 2019-08-03 DIAGNOSIS — Z9884 Bariatric surgery status: Secondary | ICD-10-CM | POA: Insufficient documentation

## 2019-08-03 DIAGNOSIS — F17210 Nicotine dependence, cigarettes, uncomplicated: Secondary | ICD-10-CM | POA: Insufficient documentation

## 2019-08-03 DIAGNOSIS — G40802 Other epilepsy, not intractable, without status epilepticus: Secondary | ICD-10-CM | POA: Diagnosis not present

## 2019-08-03 LAB — CBC WITH DIFFERENTIAL (CANCER CENTER ONLY)
Abs Immature Granulocytes: 0.02 10*3/uL (ref 0.00–0.07)
Basophils Absolute: 0.1 10*3/uL (ref 0.0–0.1)
Basophils Relative: 1 %
Eosinophils Absolute: 0.3 10*3/uL (ref 0.0–0.5)
Eosinophils Relative: 4 %
HCT: 30 % — ABNORMAL LOW (ref 36.0–46.0)
Hemoglobin: 8.8 g/dL — ABNORMAL LOW (ref 12.0–15.0)
Immature Granulocytes: 0 %
Lymphocytes Relative: 28 %
Lymphs Abs: 2.4 10*3/uL (ref 0.7–4.0)
MCH: 21 pg — ABNORMAL LOW (ref 26.0–34.0)
MCHC: 29.3 g/dL — ABNORMAL LOW (ref 30.0–36.0)
MCV: 71.4 fL — ABNORMAL LOW (ref 80.0–100.0)
Monocytes Absolute: 0.7 10*3/uL (ref 0.1–1.0)
Monocytes Relative: 9 %
Neutro Abs: 4.8 10*3/uL (ref 1.7–7.7)
Neutrophils Relative %: 58 %
Platelet Count: 408 10*3/uL — ABNORMAL HIGH (ref 150–400)
RBC: 4.2 MIL/uL (ref 3.87–5.11)
RDW: 19.7 % — ABNORMAL HIGH (ref 11.5–15.5)
WBC Count: 8.4 10*3/uL (ref 4.0–10.5)
nRBC: 0 % (ref 0.0–0.2)

## 2019-08-03 LAB — CMP (CANCER CENTER ONLY)
ALT: 9 U/L (ref 0–44)
AST: 15 U/L (ref 15–41)
Albumin: 3.1 g/dL — ABNORMAL LOW (ref 3.5–5.0)
Alkaline Phosphatase: 48 U/L (ref 38–126)
Anion gap: 8 (ref 5–15)
BUN: 21 mg/dL — ABNORMAL HIGH (ref 6–20)
CO2: 25 mmol/L (ref 22–32)
Calcium: 8.2 mg/dL — ABNORMAL LOW (ref 8.9–10.3)
Chloride: 104 mmol/L (ref 98–111)
Creatinine: 0.74 mg/dL (ref 0.44–1.00)
GFR, Est AFR Am: >60 mL/min (ref >60)
GFR, Estimated: >60 mL/min (ref >60)
Glucose, Bld: 82 mg/dL (ref 70–99)
Potassium: 4.3 mmol/L (ref 3.5–5.1)
Sodium: 137 mmol/L (ref 135–145)
Total Bilirubin: 0.3 mg/dL (ref 0.3–1.2)
Total Protein: 6.9 g/dL (ref 6.5–8.1)

## 2019-08-03 LAB — VITAMIN B12: Vitamin B-12: 183 pg/mL (ref 180–914)

## 2019-08-04 ENCOUNTER — Encounter: Payer: Self-pay | Admitting: Family Medicine

## 2019-08-04 ENCOUNTER — Encounter: Payer: Self-pay | Admitting: Nurse Practitioner

## 2019-08-04 ENCOUNTER — Other Ambulatory Visit: Payer: Self-pay

## 2019-08-04 ENCOUNTER — Telehealth: Payer: Self-pay | Admitting: Nurse Practitioner

## 2019-08-04 ENCOUNTER — Ambulatory Visit (INDEPENDENT_AMBULATORY_CARE_PROVIDER_SITE_OTHER): Payer: Medicaid Other | Admitting: Family Medicine

## 2019-08-04 VITALS — BP 120/72 | HR 91 | Ht 65.0 in | Wt 158.0 lb

## 2019-08-04 DIAGNOSIS — D1721 Benign lipomatous neoplasm of skin and subcutaneous tissue of right arm: Secondary | ICD-10-CM

## 2019-08-04 DIAGNOSIS — D509 Iron deficiency anemia, unspecified: Secondary | ICD-10-CM | POA: Insufficient documentation

## 2019-08-04 DIAGNOSIS — D508 Other iron deficiency anemias: Secondary | ICD-10-CM | POA: Diagnosis not present

## 2019-08-04 LAB — FERRITIN: Ferritin: <4 ng/mL — ABNORMAL LOW (ref 11–307)

## 2019-08-04 LAB — IRON AND TIBC
Iron: 15 ug/dL — ABNORMAL LOW (ref 41–142)
Saturation Ratios: 3 % — ABNORMAL LOW (ref 21–57)
TIBC: 508 ug/dL — ABNORMAL HIGH (ref 236–444)
UIBC: 493 ug/dL — ABNORMAL HIGH (ref 120–384)

## 2019-08-04 LAB — FOLATE RBC
Folate, Hemolysate: 282 ng/mL
Folate, RBC: 925 ng/mL (ref >498)
Hematocrit: 30.5 % — ABNORMAL LOW (ref 34.0–46.6)

## 2019-08-04 NOTE — Progress Notes (Signed)
[place] Telemedicine Visit  Patient consented to have virtual visit. Method of visit: Telephone  Encounter participants: Patient: Isabella Jenkins - located at care Provider: Cleophas Dunker - located at [facility] Others (if applicable): none  Chief Complaint: f/u anemia and lipoma  HPI: Isabella Jenkins is a 42 yo female who presents today to discuss the following:  Anemia Has a history of IDA. Previously seen in 08/2018 by hematology and did not follow up.  Was started on po iron QOD and has been tolerating well.  Saw hematology yesterday and is planning to start IV iron weekly x2 and B12 monthly and planning to repeat labs in 2 and 4 months.    Lipoma  Had attempting to contact patient regarding [place] results, but was unsuccessful.  She had [place] last month to evaluate possible lipoma and would like these results.  HM: Patient did not have HPV co-testing at last pap, therefore is due.     ROS: per HPI  Pertinent PMHx: Hx Gastric Bypass Surgery, IDA, Seizure Disorder   Exam:  Respiratory: Speaking in complete sentences, no evidence of respiratory distress on the phone  Assessment/Plan:  Iron deficiency anemia Hematology note reviewed.  Following with Heme and planning for IV feraheme x2 and B12 qmth injections.    Lipoma of right upper extremity US results discussed with patient.  Advised of referral to [facility] and given number to call to schedule appointment, as she would like to have removed.   HM: advised that she is due for a Pap and to make appointment at her convenience  Time spent during visit with patient: 12 minutes

## 2019-08-04 NOTE — Telephone Encounter (Signed)
Scheduled appt per 10/5 los.  Spoke with patient and she is aware of the appt date and time.
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
## 2019-08-05 ENCOUNTER — Encounter: Payer: Self-pay | Admitting: Family Medicine

## 2019-08-05 ENCOUNTER — Telehealth: Payer: Self-pay

## 2019-08-05 DIAGNOSIS — D1721 Benign lipomatous neoplasm of skin and subcutaneous tissue of right arm: Secondary | ICD-10-CM | POA: Insufficient documentation

## 2019-08-05 NOTE — Telephone Encounter (Signed)
Attempted to call patient, her mail box is full, wanted to let her know her lab results and Cira Rue NP recommended IV iron, which has been scheduled.

## 2019-08-05 NOTE — Assessment & Plan Note (Signed)
Hematology note reviewed.  Following with Heme and planning for IV feraheme x2 and B12 qmth injections.

## 2019-08-05 NOTE — Progress Notes (Signed)
I will like to show you how to enter the charge accurately. Will you be available tomorrow afternoon?

## 2019-08-05 NOTE — Telephone Encounter (Signed)
-----   Message from Alla Feeling, NP sent at 08/04/2019  3:04 PM EDT ----- Please let her know labs. Iron remains very low, has not improved much on oral iron. We will proceed with IV iron weekly x2 starting next week. I also recommend B12 injection monthly, her level is 183 which is borderline low. If she agrees, will do it with iron next week.  Thanks, Regan Rakers

## 2019-08-05 NOTE — Assessment & Plan Note (Signed)
[place] results discussed with patient.  Advised of referral to [facility] and given number to call to schedule appointment, as she would like to have removed.

## 2019-08-06 NOTE — Addendum Note (Signed)
Addended by: Cleophas Dunker on: 08/06/2019 08:40 AM   Modules accepted: Level of Service

## 2019-08-10 ENCOUNTER — Inpatient Hospital Stay: Payer: Medicaid Other

## 2019-08-17 ENCOUNTER — Other Ambulatory Visit: Payer: Self-pay

## 2019-08-17 ENCOUNTER — Inpatient Hospital Stay: Payer: Medicaid Other

## 2019-08-17 VITALS — BP 130/91 | HR 77 | Temp 98.6°F | Resp 18

## 2019-08-17 DIAGNOSIS — D509 Iron deficiency anemia, unspecified: Secondary | ICD-10-CM | POA: Diagnosis not present

## 2019-08-17 MED ORDER — SODIUM CHLORIDE 0.9 % IV SOLN
510.0000 mg | Freq: Once | INTRAVENOUS | Status: AC
  Administered 2019-08-17: 510 mg via INTRAVENOUS
  Filled 2019-08-17: qty 510

## 2019-08-17 MED ORDER — CYANOCOBALAMIN 1000 MCG/ML IJ SOLN
INTRAMUSCULAR | Status: AC
  Filled 2019-08-17: qty 1

## 2019-08-17 MED ORDER — SODIUM CHLORIDE 0.9 % IV SOLN
Freq: Once | INTRAVENOUS | Status: AC
  Administered 2019-08-17: 09:00:00 via INTRAVENOUS
  Filled 2019-08-17: qty 250

## 2019-08-17 MED ORDER — CYANOCOBALAMIN 1000 MCG/ML IJ SOLN
1000.0000 ug | Freq: Once | INTRAMUSCULAR | Status: AC
  Administered 2019-08-17: 1000 ug via INTRAMUSCULAR

## 2019-08-17 NOTE — Patient Instructions (Signed)
Ferumoxytol injection What is this medicine? FERUMOXYTOL is an iron complex. Iron is used to make healthy red blood cells, which carry oxygen and nutrients throughout the body. This medicine is used to treat iron deficiency anemia. This medicine may be used for other purposes; ask your health care provider or pharmacist if you have questions. COMMON BRAND NAME(S): Feraheme What should I tell my health care provider before I take this medicine? They need to know if you have any of these conditions:  anemia not caused by low iron levels  high levels of iron in the blood  magnetic resonance imaging (MRI) test scheduled  an unusual or allergic reaction to iron, other medicines, foods, dyes, or preservatives  pregnant or trying to get pregnant  breast-feeding How should I use this medicine? This medicine is for injection into a vein. It is given by a health care professional in a hospital or clinic setting. Talk to your pediatrician regarding the use of this medicine in children. Special care may be needed. Overdosage: If you think you have taken too much of this medicine contact a poison control center or emergency room at once. NOTE: This medicine is only for you. Do not share this medicine with others. What if I miss a dose? It is important not to miss your dose. Call your doctor or health care professional if you are unable to keep an appointment. What may interact with this medicine? This medicine may interact with the following medications:  other iron products This list may not describe all possible interactions. Give your health care provider a list of all the medicines, herbs, non-prescription drugs, or dietary supplements you use. Also tell them if you smoke, drink alcohol, or use illegal drugs. Some items may interact with your medicine. What should I watch for while using this medicine? Visit your doctor or healthcare professional regularly. Tell your doctor or healthcare  professional if your symptoms do not start to get better or if they get worse. You may need blood work done while you are taking this medicine. You may need to follow a special diet. Talk to your doctor. Foods that contain iron include: whole grains/cereals, dried fruits, beans, or peas, leafy green vegetables, and organ meats (liver, kidney). What side effects may I notice from receiving this medicine? Side effects that you should report to your doctor or health care professional as soon as possible:  allergic reactions like skin rash, itching or hives, swelling of the face, lips, or tongue  breathing problems  changes in blood pressure  feeling faint or lightheaded, falls  fever or chills  flushing, sweating, or hot feelings  swelling of the ankles or feet Side effects that usually do not require medical attention (report to your doctor or health care professional if they continue or are bothersome):  diarrhea  headache  nausea, vomiting  stomach pain This list may not describe all possible side effects. Call your doctor for medical advice about side effects. You may report side effects to FDA at 1-800-FDA-1088. Where should I keep my medicine? This drug is given in a hospital or clinic and will not be stored at home. NOTE: This sheet is a summary. It may not cover all possible information. If you have questions about this medicine, talk to your doctor, pharmacist, or health care provider.  2020 Elsevier/Gold Standard (2016-12-03 20:21:10)  Cyanocobalamin, Vitamin B12 injection What is this medicine? CYANOCOBALAMIN (sye an oh koe BAL a min) is a man made form of vitamin  B12. Vitamin B12 is used in the growth of healthy blood cells, nerve cells, and proteins in the body. It also helps with the metabolism of fats and carbohydrates. This medicine is used to treat people who can not absorb vitamin B12. This medicine may be used for other purposes; ask your health care provider or  pharmacist if you have questions. COMMON BRAND NAME(S): B-12 Compliance Kit, B-12 Injection Kit, Cyomin, LA-12, Nutri-Twelve, Physicians EZ Use B-12, Primabalt What should I tell my health care provider before I take this medicine? They need to know if you have any of these conditions:  kidney disease  Leber's disease  megaloblastic anemia  an unusual or allergic reaction to cyanocobalamin, cobalt, other medicines, foods, dyes, or preservatives  pregnant or trying to get pregnant  breast-feeding How should I use this medicine? This medicine is injected into a muscle or deeply under the skin. It is usually given by a health care professional in a clinic or doctor's office. However, your doctor may teach you how to inject yourself. Follow all instructions. Talk to your pediatrician regarding the use of this medicine in children. Special care may be needed. Overdosage: If you think you have taken too much of this medicine contact a poison control center or emergency room at once. NOTE: This medicine is only for you. Do not share this medicine with others. What if I miss a dose? If you are given your dose at a clinic or doctor's office, call to reschedule your appointment. If you give your own injections and you miss a dose, take it as soon as you can. If it is almost time for your next dose, take only that dose. Do not take double or extra doses. What may interact with this medicine?  colchicine  heavy alcohol intake This list may not describe all possible interactions. Give your health care provider a list of all the medicines, herbs, non-prescription drugs, or dietary supplements you use. Also tell them if you smoke, drink alcohol, or use illegal drugs. Some items may interact with your medicine. What should I watch for while using this medicine? Visit your doctor or health care professional regularly. You may need blood work done while you are taking this medicine. You may need to  follow a special diet. Talk to your doctor. Limit your alcohol intake and avoid smoking to get the best benefit. What side effects may I notice from receiving this medicine? Side effects that you should report to your doctor or health care professional as soon as possible:  allergic reactions like skin rash, itching or hives, swelling of the face, lips, or tongue  blue tint to skin  chest tightness, pain  difficulty breathing, wheezing  dizziness  red, swollen painful area on the leg Side effects that usually do not require medical attention (report to your doctor or health care professional if they continue or are bothersome):  diarrhea  headache This list may not describe all possible side effects. Call your doctor for medical advice about side effects. You may report side effects to FDA at 1-800-FDA-1088. Where should I keep my medicine? Keep out of the reach of children. Store at room temperature between 15 and 30 degrees C (59 and 85 degrees F). Protect from light. Throw away any unused medicine after the expiration date. NOTE: This sheet is a summary. It may not cover all possible information. If you have questions about this medicine, talk to your doctor, pharmacist, or health care provider.  2020 Elsevier/Gold  Standard (2008-01-26 22:10:20)

## 2019-08-18 ENCOUNTER — Telehealth: Payer: Self-pay | Admitting: Nurse Practitioner

## 2019-08-18 NOTE — Telephone Encounter (Signed)
Scheduled appt per 10/19 sch message - unable to reach pt and unable to leave message - vmail full . Message sent back to RN Letting her know

## 2019-08-24 ENCOUNTER — Inpatient Hospital Stay: Payer: Medicaid Other

## 2019-08-31 ENCOUNTER — Ambulatory Visit: Payer: Medicaid Other | Admitting: Adult Health

## 2019-08-31 ENCOUNTER — Other Ambulatory Visit: Payer: Self-pay

## 2019-08-31 ENCOUNTER — Encounter: Payer: Self-pay | Admitting: Adult Health

## 2019-08-31 VITALS — BP 139/90 | HR 89 | Temp 97.7°F | Ht 65.0 in | Wt 167.2 lb

## 2019-08-31 DIAGNOSIS — Z5181 Encounter for therapeutic drug level monitoring: Secondary | ICD-10-CM

## 2019-08-31 DIAGNOSIS — G40909 Epilepsy, unspecified, not intractable, without status epilepticus: Secondary | ICD-10-CM | POA: Diagnosis not present

## 2019-08-31 NOTE — Patient Instructions (Addendum)
Your Plan:  Continue Depakote ER 2000 mg daily Blood work today If you have any seizure events please let us know.    Thank you for coming to see [place] at [facility]. I hope we have been able to provide you high quality care today.  You may receive a patient satisfaction survey over the next few weeks. We would appreciate your feedback and comments so that we may continue to improve ourselves and the health of our patients.

## 2019-08-31 NOTE — Progress Notes (Signed)
PATIENT: Lylith Kertis DOB: 30-Jul-1976  REASON FOR VISIT: follow up HISTORY FROM: patient  HISTORY OF PRESENT ILLNESS: Today 08/31/19: Ms. Crose is a 43 year old female with a history of seizures.  She returns today for follow-up.  She is currently on Depakote 2000 mg extended release daily.  She reports that she had a "mild seizure" on Friday.  She states that she was up Thursday night with diarrhea.  She reports that she is lactose intolerance and had dairy product.  She states in the past she is only had a seizure if she missed her medication.  She states that the seizure was not witnessed but when she woke up she was in her bed and her muscles were sore and tight.  She did not bite her gum or tongue.  No loss of urine.  She states that she did feel like she had a seizure as this is how she is felt before.  Reports that she tolerates the medication well.  She is able to complete all ADLs independently.  She operates a Teacher, music without difficulty.  She returns today for an evaluation.   HISTORY 0/28/2019CM Ms. Umble, 43 year old female returns for follow-up with history of seizure disorder.  She thinks she may have had a seizure back in the summer in the middle of the night.  Apparently she had some jerking activity and woke  her son up.  She says she may have missed a dose of her medication also she is not sure.  She is currently on Depakote 2000 extended release daily.  She needs labs and refills.  She does not have a primary care provider.  She complains with intermittent stomach pain she returns for reevaluation  REVIEW OF SYSTEMS: Out of a complete 14 system review of symptoms, the patient complains only of the following symptoms, and all other reviewed systems are negative.  Headache seizure weakness tremors  ALLERGIES: Allergies  Allergen Reactions  . Ibuprofen Other (See Comments)    Can't take due to gastric surgery.   . Tramadol Other (See Comments)    Can't take  this medication with seizure medication.   [name] [Cyclobenzaprine] Anxiety    HOME MEDICATIONS: Outpatient Medications Prior to Visit  Medication Sig Dispense Refill  . acetaminophen (TYLENOL) 500 MG tablet Take 1,000-1,500 mg by mouth every 6 (six) hours as needed for moderate pain or headache.    . divalproex (DEPAKOTE ER) 500 MG 24 hr tablet TAKE 4 TABLETS (2,000 MG TOTAL) BY MOUTH DAILY. 120 tablet 11  . Iron, Ferrous Sulfate, 325 (65 Fe) MG TABS Take 1 tablet by mouth every other day. 30 tablet 3  . norethindrone-ethinyl estradiol (JUNEL FE 1/20) 1-20 MG-MCG tablet Take 1 tablet by mouth daily. 1 Package 11  . omeprazole (PRILOSEC) 20 MG capsule Take 1 capsule (20 mg total) by mouth daily. 30 capsule 3   No facility-administered medications prior to visit.     PAST MEDICAL HISTORY: Past Medical History:  Diagnosis Date  . Ankle fracture   . Anxiety   . Blood transfusion without reported diagnosis   . Gastric bypass status for obesity   . Hypertension   . Mood swings   . Seizures ([place])    Last seizure 8/15    PAST SURGICAL HISTORY: Past Surgical History:  Procedure Laterality Date  . ANKLE CLOSED REDUCTION Right 06/10/2014   Procedure: CLOSED REDUCTION AND SPLINTING RIGHT TALUS FRACTURE DISLOCATION;  Surgeon: Rozanna Box, MD;  Location: [facility];  Service: Orthopedics;  Laterality: Right;  . CAST APPLICATION Right [id number]   Procedure: CAST APPLICATION;  Surgeon: Rozanna Box, MD;  Location: [facility];  Service: Orthopedics;  Laterality: Right;  . DILATION AND CURETTAGE OF UTERUS  09/2018   retained products of conception  . EXTERNAL FIXATION LEG Left 06/10/2014   Procedure: EXTERNAL FIXATION LEFT PILON FRACTURE;  Surgeon: Rozanna Box, MD;  Location: [facility];  Service: Orthopedics;  Laterality: Left;  Marland Kitchen GASTRIC BYPASS    . I&D EXTREMITY Left 06/10/2014   Procedure: IRRIGATION AND DEBRIDEMENT LEFT OPEN FRACTURE; INCLUDING BONE;  Surgeon: Rozanna Box, MD;   Location: [facility];  Service: Orthopedics;  Laterality: Left;  . ORIF ANKLE FRACTURE Left 06/24/2014   Procedure: OPEN REDUCTION INTERNAL FIXATION (ORIF) TIBIA PILON  FRACTURE WITH REMOVAL OF EXTERNAL FIXATURE ;  Surgeon: Rozanna Box, MD;  Location: [facility];  Service: Orthopedics;  Laterality: Left;  . ORIF FIBULA FRACTURE Left 06/10/2014   Procedure: OPEN REDUCTION INTERNAL FIXATION (ORIF) PILON FRACTURE, FIBULA ONLY;  Surgeon: Rozanna Box, MD;  Location: [facility];  Service: Orthopedics;  Laterality: Left;  . WISDOM TOOTH EXTRACTION      FAMILY HISTORY: Family History  Problem Relation Age of Onset  . High blood pressure Mother   . High Cholesterol Mother   . Stroke Father   . High blood pressure Father   . Hypertension Sister     SOCIAL HISTORY: Social History   Socioeconomic History  . Marital status: Single    Spouse name: Not on file  . Number of children: 3  . Years of education: 72  . Highest education level: 12th grade  Occupational History  . Occupation: Chemical engineer [employer]  Social Needs  . Financial resource strain: Not on file  . Food insecurity    Worry: Not on file    Inability: Not on file  . Transportation needs    Medical: Not on file    Non-medical: Not on file  Tobacco Use  . Smoking status: Current Every Day Smoker    Packs/day: 0.10    Types: Cigarettes  . Smokeless tobacco: Never Used  Substance and Sexual Activity  . Alcohol use: Yes    Alcohol/week: 3.0 standard drinks    Types: 3 Cans of beer per week    Comment: Usually 3-4 a night  . Drug use: Never  . Sexual activity: Yes    Birth control/protection: Pill  Lifestyle  . Physical activity    Days per week: Not on file    Minutes per session: Not on file  . Stress: Not on file  Relationships  . Social Herbalist on phone: Not on file    Gets together: Not on file    Attends religious service: Not on file    Active member of club or organization: Not on file    Attends meetings  of clubs or organizations: Not on file    Relationship status: Not on file  . Intimate partner violence    Fear of current or ex partner: Not on file    Emotionally abused: Not on file    Physically abused: Not on file    Forced sexual activity: Not on file  Other Topics Concern  . Not on file  Social History Narrative   Patient lives at home with her children . Patient is divorcing. Patient works full time at [employer].    Education high school.   Right handed.  Caffeine one cup of coffee daily.      PHYSICAL EXAM  Vitals:   08/31/19 1129  BP: 139/90  Pulse: 89  Temp: 97.7 F (36.5 C)  TempSrc: Oral  Weight: 167 lb 3.2 oz (75.8 kg)  Height: 5\' 5"  (1.651 m)   Body mass index is 26.29 kg/m.  Generalized: Well developed, in no acute distress   Neurological examination  Mentation: Alert oriented to time, place, history taking. Follows all commands speech and language fluent Cranial nerve II-XII: Pupils were equal round reactive to light. Extraocular movements were full, visual field were full on confrontational test.. Head turning and shoulder shrug  were normal and symmetric. Motor: The motor testing reveals 5 over 5 strength of all 4 extremities. Good symmetric motor tone is noted throughout.  Sensory: Sensory testing is intact to soft touch on all 4 extremities. No evidence of extinction is noted.  Coordination: Cerebellar testing reveals good finger-nose-finger and heel-to-shin bilaterally.  Gait and station: Gait is normal. Tandem gait is normal. Romberg is negative. No drift is seen.  Reflexes: Deep tendon reflexes are symmetric and normal bilaterally.   DIAGNOSTIC DATA (LABS, IMAGING, TESTING) - I reviewed patient records, labs, notes, testing and imaging myself where available.  Lab Results  Component Value Date   WBC 8.4 08/03/2019   HGB 8.8 (L) 08/03/2019   HCT 30.0 (L) 08/03/2019   HCT 30.5 (L) 08/03/2019   MCV 71.4 (L) 08/03/2019   PLT 408 (H)  08/03/2019      Component Value Date/Time   NA 137 08/03/2019 1521   NA 138 08/25/2018 1203   K 4.3 08/03/2019 1521   CL 104 08/03/2019 1521   CO2 25 08/03/2019 1521   GLUCOSE 82 08/03/2019 1521   BUN 21 (H) 08/03/2019 1521   BUN 13 08/25/2018 1203   CREATININE 0.74 08/03/2019 1521   CALCIUM 8.2 (L) 08/03/2019 1521   PROT 6.9 08/03/2019 1521   PROT 6.7 08/25/2018 1203   ALBUMIN 3.1 (L) 08/03/2019 1521   ALBUMIN 3.9 08/25/2018 1203   AST 15 08/03/2019 1521   ALT 9 08/03/2019 1521   ALKPHOS 48 08/03/2019 1521   BILITOT 0.3 08/03/2019 1521   GFRNONAA >60 08/03/2019 1521   GFRAA >60 08/03/2019 1521   No results found for: CHOL, HDL, LDLCALC, LDLDIRECT, TRIG, CHOLHDL Lab Results  Component Value Date   HGBA1C 4.9 03/04/2014   Lab Results  Component Value Date   VITAMINB12 183 08/03/2019   No results found for: TSH    ASSESSMENT AND PLAN 43 y.o. year old female  has a past medical history of Ankle fracture, Anxiety, Blood transfusion without reported diagnosis, Gastric bypass status for obesity, Hypertension, Mood swings, and Seizures ([place]). here with:  Seizures  -Continue Depakote extended release 2000 mg daily -Blood work today -Possible recent seizure event?  Once we obtain blood work may consider increasing Depakote extended release to 2500 milligrams daily.  She potentially had a seizure due to lack of sleep and stomach upset?    She was advised that if she has additional seizure events she should let us know.  She will follow-up in 1 year or sooner if needed.   I spent 15 minutes with the patient. 50% of this time was spent discussing plan of care   Ward Givens, MSN, NP-C 08/31/2019, 11:33 AM [facility] [address] [place], [place] [address] [phone]

## 2019-09-01 LAB — VALPROIC ACID LEVEL: Valproic Acid Lvl: 50 ug/mL (ref 50–100)

## 2019-09-03 ENCOUNTER — Telehealth: Payer: Self-pay | Admitting: Adult Health

## 2019-09-03 MED ORDER — DIVALPROEX SODIUM ER 500 MG PO TB24: 2500.0000 mg | ORAL_TABLET | Freq: Every day | ORAL | 11 refills | Status: DC

## 2019-09-03 NOTE — Telephone Encounter (Signed)
I called the patient.  Her Depakote level was 50.  I will increase Depakote extended release to 2500 mg daily since she had a breakthrough seizure.

## 2019-09-10 ENCOUNTER — Other Ambulatory Visit: Payer: Self-pay

## 2019-09-10 ENCOUNTER — Inpatient Hospital Stay: Payer: Medicaid Other | Attending: Nurse Practitioner

## 2019-09-10 VITALS — BP 153/98 | HR 85 | Temp 97.9°F | Resp 18

## 2019-09-10 DIAGNOSIS — D519 Vitamin B12 deficiency anemia, unspecified: Secondary | ICD-10-CM | POA: Insufficient documentation

## 2019-09-10 DIAGNOSIS — D509 Iron deficiency anemia, unspecified: Secondary | ICD-10-CM

## 2019-09-10 MED ORDER — CYANOCOBALAMIN 1000 MCG/ML IJ SOLN
1000.0000 ug | Freq: Once | INTRAMUSCULAR | Status: AC
  Administered 2019-09-10: 1000 ug via INTRAMUSCULAR

## 2019-09-10 NOTE — Patient Instructions (Signed)
Cyanocobalamin, Pyridoxine, and Folate What is this medicine? A multivitamin containing folic acid, vitamin B6, and vitamin B12. This medicine may be used for other purposes; ask your health care provider or pharmacist if you have questions. COMMON BRAND NAME(S): AllanFol RX, AllanTex, Av-Vite FB, B Complex with Folic Acid, ComBgen, FaBB, Folamin, Folastin, Folbalin, Folbee, Folbic, Folcaps, Folgard, Folgard RX, Folgard RX 2.2, Folplex, Folplex 2.2, Foltabs 800, Foltx, Homocysteine Formula, Niva-Fol, NuFol, TL Gard RX, Virt-Gard, Virt-Vite, Virt-Vite Forte, Vita-Respa What should I tell my health care provider before I take this medicine? They need to know if you have any of these conditions:  bleeding or clotting disorder  history of anemia of any type  other chronic health condition  an unusual or allergic reaction to vitamins, other medicines, foods, dyes, or preservatives  pregnant or trying to get pregnant  breast-feeding How should I use this medicine? Take by mouth with a glass of water. May take with food. Follow the directions on the prescription label. It is usually given once a day. Do not take your medicine more often than directed. Contact your pediatrician regarding the use of this medicine in children. Special care may be needed. Overdosage: If you think you have taken too much of this medicine contact a poison control center or emergency room at once. NOTE: This medicine is only for you. Do not share this medicine with others. What if I miss a dose? If you miss a dose, take it as soon as you can. If it is almost time for your next dose, take only that dose. Do not take double or extra doses. What may interact with this medicine?  levodopa This list may not describe all possible interactions. Give your health care provider a list of all the medicines, herbs, non-prescription drugs, or dietary supplements you use. Also tell them if you smoke, drink alcohol, or use illegal  drugs. Some items may interact with your medicine. What should I watch for while using this medicine? See your health care professional for regular checks on your progress. Remember that vitamin supplements do not replace the need for good nutrition from a balanced diet. What side effects may I notice from receiving this medicine? Side effects that you should report to your doctor or health care professional as soon as possible:  allergic reaction such as skin rash or difficulty breathing  vomiting Side effects that usually do not require medical attention (report to your doctor or health care professional if they continue or are bothersome):  nausea  stomach upset This list may not describe all possible side effects. Call your doctor for medical advice about side effects. You may report side effects to FDA at 1-800-FDA-1088. Where should I keep my medicine? Keep out of the reach of children. Most vitamins should be stored at controlled room temperature. Check your specific product directions. Protect from heat and moisture. Throw away any unused medicine after the expiration date. NOTE: This sheet is a summary. It may not cover all possible information. If you have questions about this medicine, talk to your doctor, pharmacist, or health care provider.  2020 Elsevier/Gold Standard (2007-12-06 00:59:55)  

## 2019-10-05 ENCOUNTER — Inpatient Hospital Stay: Payer: Medicaid Other | Attending: Nurse Practitioner

## 2019-10-05 ENCOUNTER — Inpatient Hospital Stay: Payer: Medicaid Other

## 2019-11-05 ENCOUNTER — Inpatient Hospital Stay: Payer: Medicaid Other | Attending: Nurse Practitioner

## 2019-11-12 ENCOUNTER — Other Ambulatory Visit: Payer: Self-pay | Admitting: Family Medicine

## 2019-11-12 DIAGNOSIS — K219 Gastro-esophageal reflux disease without esophagitis: Secondary | ICD-10-CM

## 2019-11-25 ENCOUNTER — Encounter: Payer: Self-pay | Admitting: Adult Health

## 2019-11-25 ENCOUNTER — Ambulatory Visit: Payer: Medicaid Other | Admitting: Adult Health

## 2019-11-25 ENCOUNTER — Other Ambulatory Visit: Payer: Self-pay

## 2019-11-25 VITALS — BP 140/90 | HR 85 | Temp 97.0°F | Ht 65.0 in | Wt 173.0 lb

## 2019-11-25 DIAGNOSIS — G40909 Epilepsy, unspecified, not intractable, without status epilepticus: Secondary | ICD-10-CM | POA: Diagnosis not present

## 2019-11-25 DIAGNOSIS — Z5181 Encounter for therapeutic drug level monitoring: Secondary | ICD-10-CM | POA: Diagnosis not present

## 2019-11-25 NOTE — Progress Notes (Signed)
PATIENT: Isabella Jenkins DOB: 20-Aug-1976  REASON FOR VISIT: follow up HISTORY FROM: patient  HISTORY OF PRESENT ILLNESS: Today 11/25/19:  Isabella Jenkins is a 44 year old female with a history of seizures.  She returns today for follow-up.  At the last visit Depakote was increased to 2500 milligrams daily.  She reports that she has had 2 seizures in the last month.  They have both occurred at night.  She states that she woke up around 3 AM to go the bathroom.  She came back and got the bed.  She woke up around 5 AM.  She felt spacey, she saw that stuff was off her dresser and on the floor.  She had a scrape on her arm.  She had urinated in the bed.  And she states that she felt weak.  She states that she drinks 3-6 beers a night.  She returns today for an evaluation  HISTORY  08/31/19: Isabella Jenkins is a 44 year old female with a history of seizures.  She returns today for follow-up.  She is currently on Depakote 2000 mg extended release daily.  She reports that she had a "mild seizure" on Friday.  She states that she was up Thursday night with diarrhea.  She reports that she is lactose intolerance and had dairy product.  She states in the past she is only had a seizure if she missed her medication.  She states that the seizure was not witnessed but when she woke up she was in her bed and her muscles were sore and tight.  She did not bite her gum or tongue.  No loss of urine.  She states that she did feel like she had a seizure as this is how she is felt before.  Reports that she tolerates the medication well.  She is able to complete all ADLs independently.  She operates a Teacher, music without difficulty.  She returns today for an evaluation.    REVIEW OF SYSTEMS: Out of a complete 14 system review of symptoms, the patient complains only of the following symptoms, and all other reviewed systems are negative.  See HPI  ALLERGIES: Allergies  Allergen Reactions  . Ibuprofen Other (See Comments)     Can't take due to gastric surgery.   . Tramadol Other (See Comments)    Can't take this medication with seizure medication.   [name] [Cyclobenzaprine] Anxiety    HOME MEDICATIONS: Outpatient Medications Prior to Visit  Medication Sig Dispense Refill  . acetaminophen (TYLENOL) 500 MG tablet Take 1,000-1,500 mg by mouth every 6 (six) hours as needed for moderate pain or headache.    . divalproex (DEPAKOTE ER) 500 MG 24 hr tablet Take 5 tablets (2,500 mg total) by mouth daily. 150 tablet 11  . Iron, Ferrous Sulfate, 325 (65 Fe) MG TABS Take 1 tablet by mouth every other day. 30 tablet 3  . norethindrone-ethinyl estradiol (JUNEL FE 1/20) 1-20 MG-MCG tablet Take 1 tablet by mouth daily. 1 Package 11  . omeprazole (PRILOSEC) 20 MG capsule TAKE 1 CAPSULE BY MOUTH EVERY DAY 30 capsule 6   No facility-administered medications prior to visit.    PAST MEDICAL HISTORY: Past Medical History:  Diagnosis Date  . Ankle fracture   . Anxiety   . Blood transfusion without reported diagnosis   . Gastric bypass status for obesity   . Hypertension   . Mood swings   . Seizures ([place])    Last seizure 8/15    PAST SURGICAL HISTORY:  Past Surgical History:  Procedure Laterality Date  . ANKLE CLOSED REDUCTION Right 06/10/2014   Procedure: CLOSED REDUCTION AND SPLINTING RIGHT TALUS FRACTURE DISLOCATION;  Surgeon: Rozanna Box, MD;  Location: [facility];  Service: Orthopedics;  Laterality: Right;  . CAST APPLICATION Right [id number]   Procedure: CAST APPLICATION;  Surgeon: Rozanna Box, MD;  Location: [facility];  Service: Orthopedics;  Laterality: Right;  . DILATION AND CURETTAGE OF UTERUS  09/2018   retained products of conception  . EXTERNAL FIXATION LEG Left 06/10/2014   Procedure: EXTERNAL FIXATION LEFT PILON FRACTURE;  Surgeon: Rozanna Box, MD;  Location: [facility];  Service: Orthopedics;  Laterality: Left;  Marland Kitchen GASTRIC BYPASS    . I & D EXTREMITY Left 06/10/2014   Procedure: IRRIGATION AND  DEBRIDEMENT LEFT OPEN FRACTURE; INCLUDING BONE;  Surgeon: Rozanna Box, MD;  Location: [facility];  Service: Orthopedics;  Laterality: Left;  . ORIF ANKLE FRACTURE Left 06/24/2014   Procedure: OPEN REDUCTION INTERNAL FIXATION (ORIF) TIBIA PILON  FRACTURE WITH REMOVAL OF EXTERNAL FIXATURE ;  Surgeon: Rozanna Box, MD;  Location: [facility];  Service: Orthopedics;  Laterality: Left;  . ORIF FIBULA FRACTURE Left 06/10/2014   Procedure: OPEN REDUCTION INTERNAL FIXATION (ORIF) PILON FRACTURE, FIBULA ONLY;  Surgeon: Rozanna Box, MD;  Location: [place];  Service: Orthopedics;  Laterality: Left;  . WISDOM TOOTH EXTRACTION      FAMILY HISTORY: Family History  Problem Relation Age of Onset  . High blood pressure Mother   . High Cholesterol Mother   . Stroke Father   . High blood pressure Father   . Hypertension Sister     SOCIAL HISTORY: Social History   Socioeconomic History  . Marital status: Single    Spouse name: Not on file  . Number of children: 3  . Years of education: 24  . Highest education level: 12th grade  Occupational History  . Occupation: Chemical engineer [employer]  Tobacco Use  . Smoking status: Current Every Day Smoker    Packs/day: 0.10    Types: Cigarettes  . Smokeless tobacco: Never Used  Substance and Sexual Activity  . Alcohol use: Yes    Alcohol/week: 3.0 standard drinks    Types: 3 Cans of beer per week    Comment: Usually 3-4 a night  . Drug use: Never  . Sexual activity: Yes    Birth control/protection: Pill  Other Topics Concern  . Not on file  Social History Narrative   Patient lives at home with her children . Patient is divorcing. Patient works full time at [employer].    Education high school.   Right handed.   Caffeine one cup of coffee daily.   Social Determinants of Health   Financial Resource Strain:   . Difficulty of Paying Living Expenses: Not on file  Food Insecurity:   . Worried About Charity fundraiser in the Last Year: Not on file  . Ran Out of  Food in the Last Year: Not on file  Transportation Needs:   . Lack of Transportation (Medical): Not on file  . Lack of Transportation (Non-Medical): Not on file  Physical Activity:   . Days of Exercise per Week: Not on file  . Minutes of Exercise per Session: Not on file  Stress:   . Feeling of Stress : Not on file  Social Connections:   . Frequency of Communication with Friends and Family: Not on file  . Frequency of Social Gatherings with Friends and  Family: Not on file  . Attends Religious Services: Not on file  . Active Member of Clubs or Organizations: Not on file  . Attends Archivist Meetings: Not on file  . Marital Status: Not on file  Intimate Partner Violence:   . Fear of Current or Ex-Partner: Not on file  . Emotionally Abused: Not on file  . Physically Abused: Not on file  . Sexually Abused: Not on file      PHYSICAL EXAM  Vitals:   11/25/19 1258  BP: 140/90  Pulse: 85  Temp: (!) 97 F (36.1 C)  SpO2: 99%  Weight: 173 lb (78.5 kg)  Height: 5\' 5"  (1.651 m)   Body mass index is 28.79 kg/m.  Generalized: Well developed, in no acute distress   Neurological examination  Mentation: Alert oriented to time, place, history taking. Follows all commands speech and language fluent Cranial nerve II-XII: Pupils were equal round reactive to light. Extraocular movements were full, visual field were full on confrontational test. Head turning and shoulder shrug  were normal and symmetric. Motor: The motor testing reveals 5 over 5 strength of all 4 extremities. Good symmetric motor tone is noted throughout.  Sensory: Sensory testing is intact to soft touch on all 4 extremities. No evidence of extinction is noted.  Coordination: Cerebellar testing reveals good finger-nose-finger and heel-to-shin bilaterally.  Gait and station: Gait is normal. Tandem gait is normal. Romberg is negative. No drift is seen.  Reflexes: Deep tendon reflexes are symmetric and normal  bilaterally.   DIAGNOSTIC DATA (LABS, IMAGING, TESTING) - I reviewed patient records, labs, notes, testing and imaging myself where available.  Lab Results  Component Value Date   WBC 8.4 08/03/2019   HGB 8.8 (L) 08/03/2019   HCT 30.0 (L) 08/03/2019   HCT 30.5 (L) 08/03/2019   MCV 71.4 (L) 08/03/2019   PLT 408 (H) 08/03/2019      Component Value Date/Time   NA 137 08/03/2019 1521   NA 138 08/25/2018 1203   K 4.3 08/03/2019 1521   CL 104 08/03/2019 1521   CO2 25 08/03/2019 1521   GLUCOSE 82 08/03/2019 1521   BUN 21 (H) 08/03/2019 1521   BUN 13 08/25/2018 1203   CREATININE 0.74 08/03/2019 1521   CALCIUM 8.2 (L) 08/03/2019 1521   PROT 6.9 08/03/2019 1521   PROT 6.7 08/25/2018 1203   ALBUMIN 3.1 (L) 08/03/2019 1521   ALBUMIN 3.9 08/25/2018 1203   AST 15 08/03/2019 1521   ALT 9 08/03/2019 1521   ALKPHOS 48 08/03/2019 1521   BILITOT 0.3 08/03/2019 1521   GFRNONAA >60 08/03/2019 1521   GFRAA >60 08/03/2019 1521      ASSESSMENT AND PLAN 44 y.o. year old female  has a past medical history of Ankle fracture, Anxiety, Blood transfusion without reported diagnosis, Gastric bypass status for obesity, Hypertension, Mood swings, and Seizures ([place]). here with:  1.  Seizures  -Continue Depakote extended release 2500 mg daily -Blood work today, CBC, CMP and Depakote level -EEG ordered -Patient is cautioned that she should not be drinking alcohol while on Depakote. -Patient advised that she should not be operating a motor vehicle. -Patient also advised that she must be seizure-free for 6 months before she can resume driving.  She will follow-up in 6 months or sooner if needed  I spent 15 minutes with the patient. 50% of this time was spent   Ward Givens, MSN, NP-C 11/25/2019, 1:08 PM [facility] [address]  [place], [place] [address] [phone]

## 2019-11-25 NOTE — Patient Instructions (Signed)
Continue Depakote Blood work today Pending blood work we may consider adding or adjusting medication Limit Acholol intake If you have any seizure events please let us know.

## 2019-11-26 ENCOUNTER — Telehealth: Payer: Self-pay

## 2019-11-26 LAB — COMPREHENSIVE METABOLIC PANEL
ALT: 8 IU/L (ref 0–32)
AST: 16 IU/L (ref 0–40)
Albumin/Globulin Ratio: 1.4 (ref 1.2–2.2)
Albumin: 3.9 g/dL (ref 3.8–4.8)
Alkaline Phosphatase: 64 IU/L (ref 39–117)
BUN/Creatinine Ratio: 19 (ref 9–23)
BUN: 15 mg/dL (ref 6–24)
Bilirubin Total: 0.5 mg/dL (ref 0.0–1.2)
CO2: 23 mmol/L (ref 20–29)
Calcium: 8.8 mg/dL (ref 8.7–10.2)
Chloride: 104 mmol/L (ref 96–106)
Creatinine, Ser: 0.79 mg/dL (ref 0.57–1.00)
GFR calc Af Amer: 108 mL/min/{1.73_m2} (ref >59)
GFR calc non Af Amer: 93 mL/min/{1.73_m2} (ref >59)
Globulin, Total: 2.7 g/dL (ref 1.5–4.5)
Glucose: 78 mg/dL (ref 65–99)
Potassium: 4.7 mmol/L (ref 3.5–5.2)
Sodium: 138 mmol/L (ref 134–144)
Total Protein: 6.6 g/dL (ref 6.0–8.5)

## 2019-11-26 LAB — CBC WITH DIFFERENTIAL/PLATELET
Basophils Absolute: 0.1 10*3/uL (ref 0.0–0.2)
Basos: 2 %
EOS (ABSOLUTE): 0.3 10*3/uL (ref 0.0–0.4)
Eos: 4 %
Hematocrit: 36.8 % (ref 34.0–46.6)
Hemoglobin: 11.8 g/dL (ref 11.1–15.9)
Immature Grans (Abs): 0 10*3/uL (ref 0.0–0.1)
Immature Granulocytes: 0 %
Lymphocytes Absolute: 2 10*3/uL (ref 0.7–3.1)
Lymphs: 28 %
MCH: 29.3 pg (ref 26.6–33.0)
MCHC: 32.1 g/dL (ref 31.5–35.7)
MCV: 91 fL (ref 79–97)
Monocytes Absolute: 0.5 10*3/uL (ref 0.1–0.9)
Monocytes: 8 %
Neutrophils Absolute: 4.1 10*3/uL (ref 1.4–7.0)
Neutrophils: 58 %
Platelets: 329 10*3/uL (ref 150–450)
RBC: 4.03 x10E6/uL (ref 3.77–5.28)
RDW: 13.9 % (ref 11.7–15.4)
WBC: 7 10*3/uL (ref 3.4–10.8)

## 2019-11-26 LAB — VALPROIC ACID LEVEL: Valproic Acid Lvl: 75 ug/mL (ref 50–100)

## 2019-11-26 NOTE — Telephone Encounter (Signed)
-----   Message from Ward Givens, NP sent at 11/26/2019 11:20 AM EST ----- Labs results are unremarkable. Please call patient with results. Will wait for results of EEG

## 2019-11-26 NOTE — Telephone Encounter (Signed)
Spoke with the patient and she verbalized understanding her results. No questions or concerns at this time.   

## 2019-12-06 NOTE — Progress Notes (Deleted)
[place]   [phone]) [phone] [phone]   Clinic Follow up Note   Patient Care Team: Cleophas Dunker, DO as PCP - General (Family Medicine) Bobbye Charleston, MD as Consulting Physician (Obstetrics and Gynecology) Altamese Elkton, MD as Consulting Physician (Orthopedic Surgery) Marcial Pacas, MD as Consulting Physician (Neurology) 12/06/2019  CHIEF COMPLAINT: F/u B12 and iron deficiency anemia   CURRENT THERAPY: B12 injection and IV Feraheme PRN  INTERVAL HISTORY:   REVIEW OF SYSTEMS:   Constitutional: Denies fevers, chills or abnormal weight loss Eyes: Denies blurriness of vision Ears, nose, mouth, throat, and face: Denies mucositis or sore throat Respiratory: Denies cough, dyspnea or wheezes Cardiovascular: Denies palpitation, chest discomfort or lower extremity swelling Gastrointestinal:  Denies nausea, heartburn or change in bowel habits Skin: Denies abnormal skin rashes Lymphatics: Denies new lymphadenopathy or easy bruising Neurological:Denies numbness, tingling or new weaknesses Behavioral/Psych: Mood is stable, no new changes  All other systems were reviewed with the patient and are negative.  MEDICAL HISTORY:  Past Medical History:  Diagnosis Date  . Ankle fracture   . Anxiety   . Blood transfusion without reported diagnosis   . Gastric bypass status for obesity   . Hypertension   . Mood swings   . Seizures ([place])    Last seizure 8/15    SURGICAL HISTORY: Past Surgical History:  Procedure Laterality Date  . ANKLE CLOSED REDUCTION Right 06/10/2014   Procedure: CLOSED REDUCTION AND SPLINTING RIGHT TALUS FRACTURE DISLOCATION;  Surgeon: Rozanna Box, MD;  Location: [place];  Service: Orthopedics;  Laterality: Right;  . CAST APPLICATION Right [id number]   Procedure: CAST APPLICATION;  Surgeon: Rozanna Box, MD;  Location: [place];  Service: Orthopedics;  Laterality: Right;  . DILATION AND CURETTAGE OF UTERUS  09/2018   retained  products of conception  . EXTERNAL FIXATION LEG Left 06/10/2014   Procedure: EXTERNAL FIXATION LEFT PILON FRACTURE;  Surgeon: Rozanna Box, MD;  Location: [place];  Service: Orthopedics;  Laterality: Left;  Marland Kitchen GASTRIC BYPASS    . I & D EXTREMITY Left 06/10/2014   Procedure: IRRIGATION AND DEBRIDEMENT LEFT OPEN FRACTURE; INCLUDING BONE;  Surgeon: Rozanna Box, MD;  Location: [place];  Service: Orthopedics;  Laterality: Left;  . ORIF ANKLE FRACTURE Left 06/24/2014   Procedure: OPEN REDUCTION INTERNAL FIXATION (ORIF) TIBIA PILON  FRACTURE WITH REMOVAL OF EXTERNAL FIXATURE ;  Surgeon: Rozanna Box, MD;  Location: [place];  Service: Orthopedics;  Laterality: Left;  . ORIF FIBULA FRACTURE Left 06/10/2014   Procedure: OPEN REDUCTION INTERNAL FIXATION (ORIF) PILON FRACTURE, FIBULA ONLY;  Surgeon: Rozanna Box, MD;  Location: [place];  Service: Orthopedics;  Laterality: Left;  . WISDOM TOOTH EXTRACTION      I have reviewed the social history and family history with the patient and they are unchanged from previous note.  ALLERGIES:  is allergic to ibuprofen; tramadol; and flexeril [cyclobenzaprine].  MEDICATIONS:  Current Outpatient Medications  Medication Sig Dispense Refill  . acetaminophen (TYLENOL) 500 MG tablet Take 1,000-1,500 mg by mouth every 6 (six) hours as needed for moderate pain or headache.    . divalproex (DEPAKOTE ER) 500 MG 24 hr tablet Take 5 tablets (2,500 mg total) by mouth daily. 150 tablet 11  . Iron, Ferrous Sulfate, 325 (65 Fe) MG TABS Take 1 tablet by mouth every other day. 30 tablet 3  . norethindrone-ethinyl estradiol (JUNEL FE 1/20) 1-20 MG-MCG tablet Take 1 tablet by mouth daily. 1 Package 11  .  omeprazole (PRILOSEC) 20 MG capsule TAKE 1 CAPSULE BY MOUTH EVERY DAY 30 capsule 6   No current facility-administered medications for this visit.    PHYSICAL EXAMINATION: ECOG PERFORMANCE STATUS: {CHL ONC ECOG PS:[phone]}  There were no vitals filed for this visit. There  were no vitals filed for this visit.  GENERAL:alert, no distress and comfortable SKIN: skin color, texture, turgor are normal, no rashes or significant lesions EYES: normal, Conjunctiva are pink and non-injected, sclera clear OROPHARYNX:no exudate, no erythema and lips, buccal mucosa, and tongue normal  NECK: supple, thyroid normal size, non-tender, without nodularity LYMPH:  no palpable lymphadenopathy in the cervical, axillary or inguinal LUNGS: clear to auscultation and percussion with normal breathing effort HEART: regular rate & rhythm and no murmurs and no lower extremity edema ABDOMEN:abdomen soft, non-tender and normal bowel sounds Musculoskeletal:no cyanosis of digits and no clubbing  NEURO: alert & oriented x 3 with fluent speech, no focal motor/sensory deficits  LABORATORY DATA:  I have reviewed the data as listed CBC Latest Ref Rng & Units 11/25/2019 08/03/2019 08/03/2019  WBC 3.4 - 10.8 x10E3/uL 7.0 8.4 -  Hemoglobin 11.1 - 15.9 g/dL 11.8 8.8(L) -  Hematocrit 34.0 - 46.6 % 36.8 30.0(L) 30.5(L)  Platelets 150 - 450 x10E3/uL 329 408(H) -     CMP Latest Ref Rng & Units 11/25/2019 08/03/2019 04/23/2019  Glucose 65 - 99 mg/dL 78 82 86  BUN 6 - 24 mg/dL 15 21(H) 19  Creatinine 0.57 - 1.00 mg/dL 0.79 0.74 0.89  Sodium 134 - 144 mmol/L 138 137 136  Potassium 3.5 - 5.2 mmol/L 4.7 4.3 3.9  Chloride 96 - 106 mmol/L 104 104 105  CO2 20 - 29 mmol/L 23 25 22   Calcium 8.7 - 10.2 mg/dL 8.8 8.2(L) 8.9  Total Protein 6.0 - 8.5 g/dL 6.6 6.9 7.1  Total Bilirubin 0.0 - 1.2 mg/dL 0.5 0.3 0.8  Alkaline Phos 39 - 117 IU/L 64 48 45  AST 0 - 40 IU/L 16 15 18   ALT 0 - 32 IU/L 8 9 14       RADIOGRAPHIC STUDIES: I have personally reviewed the radiological images as listed and agreed with the findings in the report. No results found.   ASSESSMENT & PLAN:  No problem-specific Assessment & Plan notes found for this encounter.   No orders of the defined types were placed in this  encounter.  All questions were answered. The patient knows to call the clinic with any problems, questions or concerns. No barriers to learning was detected. I spent {CHL ONC TIME VISIT - [id number] counseling the patient face to face. The total time spent in the appointment was {CHL ONC TIME VISIT - [id number] and more than 50% was on counseling and review of test results     Alla Feeling, NP 12/06/19

## 2019-12-07 ENCOUNTER — Inpatient Hospital Stay: Payer: Medicaid Other

## 2019-12-07 ENCOUNTER — Telehealth: Payer: Self-pay

## 2019-12-07 ENCOUNTER — Other Ambulatory Visit: Payer: Self-pay

## 2019-12-07 ENCOUNTER — Inpatient Hospital Stay: Payer: Medicaid Other | Attending: Nurse Practitioner | Admitting: Nurse Practitioner

## 2019-12-07 ENCOUNTER — Ambulatory Visit: Payer: Medicaid Other | Admitting: Neurology

## 2019-12-07 DIAGNOSIS — G40909 Epilepsy, unspecified, not intractable, without status epilepticus: Secondary | ICD-10-CM

## 2019-12-07 NOTE — Telephone Encounter (Signed)
TC to pt to check on her since she missed her appointment this morning at 9:15am. Patient stated that she did not come because she feels like the B12 injections aren't doing anything for her and that she only wants to come to appointments if it is to see the doctor. I made Isabella Rue NP aware of patient statement. Also sent over schedule message to get her rescheduled with [facility]

## 2019-12-10 NOTE — Progress Notes (Signed)
   [facility]  EEG (ELECTROENCEPHALOGRAM) REPORT   STUDY DATE: 12/07/2019 PATIENT NAME: Isabella Jenkins DOB: 1976-05-24 MRN: [id number]  ORDERING CLINICIAN: Ward Givens, NP  TECHNOLOGIST: Nonda Lou, RPSGT TECHNIQUE: Electroencephalogram was recorded utilizing standard 10-20 system of lead placement and reformatted into average and bipolar montages.  RECORDING TIME: 25 minutes 12 seconds  CLINICAL INFORMATION: 44 year old woman with seizures  FINDINGS: A digital EEG was performed while the patient was awake and drowsy. While awake and most alert there was a 10 hz posterior dominant rhythm. Voltages and frequencies were symmetric.  There were no focal, lateralizing, epileptiform activity or seizures seen.  Photic stimulation had a normal driving response. Hyperventilation and recovery did not change the underlying rhythms. EKG channel shows normal sinus rhythm.  She became drowsy at one point but no sleep stages were recorded..  IMPRESSION: This is a normal EEG while the patient was awake and asleep.   INTERPRETING PHYSICIAN:   Zoiey Christy A. Felecia Shelling, MD, PhD, [facility] Certified in Neurology, Clinical Neurophysiology, Sleep Medicine, Pain Medicine and Neuroimaging  [facility] [address], [place], [place] [address] [phone]

## 2019-12-16 ENCOUNTER — Telehealth: Payer: Self-pay | Admitting: Nurse Practitioner

## 2019-12-16 NOTE — Telephone Encounter (Signed)
R/s appt on 2/18 due to inclement weather . Pt aware of new appt date and time

## 2019-12-17 ENCOUNTER — Ambulatory Visit: Payer: Medicaid Other | Admitting: Family Medicine

## 2019-12-17 ENCOUNTER — Inpatient Hospital Stay: Payer: Medicaid Other

## 2019-12-17 ENCOUNTER — Inpatient Hospital Stay: Payer: Medicaid Other | Admitting: Nurse Practitioner

## 2019-12-22 ENCOUNTER — Inpatient Hospital Stay: Payer: Medicaid Other

## 2019-12-22 ENCOUNTER — Inpatient Hospital Stay: Payer: Medicaid Other | Admitting: Nurse Practitioner

## 2019-12-23 ENCOUNTER — Telehealth: Payer: Self-pay | Admitting: Hematology

## 2019-12-23 NOTE — Telephone Encounter (Signed)
Called pt per 2/23 sch message -pt is aware of new appt date and time

## 2019-12-24 ENCOUNTER — Inpatient Hospital Stay: Payer: Medicaid Other

## 2019-12-24 ENCOUNTER — Inpatient Hospital Stay: Payer: Medicaid Other | Admitting: Nurse Practitioner

## 2019-12-28 ENCOUNTER — Telehealth: Payer: Self-pay | Admitting: Nurse Practitioner

## 2019-12-28 NOTE — Telephone Encounter (Signed)
Per 3/1 sch msg left voicemail to see if pt wanted to reschedule her missed appts.

## 2019-12-29 ENCOUNTER — Ambulatory Visit: Payer: Medicaid Other | Admitting: Family Medicine

## 2020-02-09 ENCOUNTER — Telehealth: Payer: Self-pay | Admitting: Neurology

## 2020-02-09 NOTE — Telephone Encounter (Signed)
Patient called stating she is needing a letter for her employer in regards to her seizure disorder and that she is okay to work and instructions in case pt were to have a seizure at work

## 2020-02-10 NOTE — Telephone Encounter (Signed)
Isabella Jenkins- can you get more information about her job duties

## 2020-02-10 NOTE — Telephone Encounter (Signed)
LMVM for pt to return call when can about her need for letter.

## 2020-02-11 ENCOUNTER — Encounter: Payer: Self-pay | Admitting: *Deleted

## 2020-02-11 NOTE — Telephone Encounter (Signed)
I spoke to pt and she relayed that she is working at a [employer] as a Scientist, water quality.  She states that she may use a 2 step step stool at the most.  She has never been asked about this before.  She will pick up to day if possible.

## 2020-02-11 NOTE — Telephone Encounter (Signed)
I called pt and LMVM for her that note is ready and place up front for pick up at her convenience.

## 2020-02-29 ENCOUNTER — Ambulatory Visit: Payer: Medicaid Other | Admitting: Adult Health

## 2020-06-13 ENCOUNTER — Other Ambulatory Visit: Payer: Self-pay

## 2020-06-13 ENCOUNTER — Ambulatory Visit (INDEPENDENT_AMBULATORY_CARE_PROVIDER_SITE_OTHER): Payer: Medicaid Other | Admitting: Family Medicine

## 2020-06-13 ENCOUNTER — Encounter: Payer: Self-pay | Admitting: Family Medicine

## 2020-06-13 VITALS — BP 132/88 | HR 95 | Wt 159.8 lb

## 2020-06-13 DIAGNOSIS — G43009 Migraine without aura, not intractable, without status migrainosus: Secondary | ICD-10-CM | POA: Diagnosis not present

## 2020-06-13 DIAGNOSIS — Z0000 Encounter for general adult medical examination without abnormal findings: Secondary | ICD-10-CM

## 2020-06-13 NOTE — Patient Instructions (Signed)
It was wonderful to meet you today.  I have provided you a work note, additionally you can continue to use Tylenol or ibuprofen (please use as limitedly due to your previous bypass) to help with your headaches.  We can always discuss using an abortive medication such as sumatriptan in the future if your headaches are persistent.  Encourage you to make sure that you are drinking plenty of water and stepwise to decrease your amount of soda intake to avoid significant withdrawal.

## 2020-06-13 NOTE — Progress Notes (Signed)
    SUBJECTIVE:   CHIEF COMPLAINT / HPI: Migraines, note for work   Isabella Jenkins is a 44 year old female presenting to discuss the following:  Migraines: Recently had a 3-day episode of recurrent headaches from Wednesday-Friday of last week.  Predominantly behind her left eye when occurred, associated photophobia without any phonophobia, nausea, vomiting, visual changes, or weakness.  Was able to take a Tylenol/ibuprofen with improvement throughout the day each day.  Had to call the work due to the pain, currently works at a [employer] as a Scientist, water quality, and needs a work note for the time that she missed.  No further headaches since then.  Reports an intermittent history of migraines for the past several years, this was similar to previous episodes.  About 2/month.  Not on any abortive or prophylactic medication as often OTC analgesics will do the trick. Gets good sleep. Drinks ~4 20oz Dr. Samson Frederic daily. Minimal H20.  Otherwise has felt like herself, no fever, malaise, or sick contacts.  Health maintenance: Shows she is due for her Pap smear, however reports she recently had one through [facility].   PERTINENT  PMH / PSH: Seizure disorder currently follows with [facility], GERD, iron deficiency anemia with history of gastric bypass, tobacco use  OBJECTIVE:   BP 132/88   Pulse 95   Wt 159 lb 12.8 oz (72.5 kg)   SpO2 98%   BMI 26.59 kg/m   General: Alert, NAD HEENT: NCAT, MMM Cardiac: RRR  Lungs: Clear bilaterally, no increased WOB  Neuro: Alert and oriented.  EOMI, PERRLA.  CN II-XII intact.  Follows commands appropriately, speech easily understandable.  Able to move all extremities spontaneously with 5/5 upper and lower extremity strength bilaterally.  Rapid hand movement intact.  Normal gait. MSK: Tense trapezius musculature predominantly on the left, full cervical ROM Ext: Warm, dry, 2+ radial pulses  ASSESSMENT/PLAN:   Migraine without aura Acute resolved episode on  chronic.  Neurologically intact on exam. Provided with work note to excuse from 8/11-8/13 due to recurrent migraine.  Additionally discussed monitoring for possible triggers, encouraged to decreasing soda and increasing water intake.  Tylenol/ibuprofen (encourage mindfulness due to previous bypass) PRN as this has been effective, can discuss prophylactic/abortive therapy in the future if become more frequent/uncontrolled with OTC.  Health care maintenance Recommended follow-up with her established OB/GYN provider for gynecological exam.    Follow-up if above recurrent/increase in frequency, otherwise regular follow-up with PCP.  Patriciaann Clan, [place]

## 2020-06-14 ENCOUNTER — Encounter: Payer: Self-pay | Admitting: Family Medicine

## 2020-06-14 DIAGNOSIS — Z0000 Encounter for general adult medical examination without abnormal findings: Secondary | ICD-10-CM | POA: Insufficient documentation

## 2020-06-14 DIAGNOSIS — G43009 Migraine without aura, not intractable, without status migrainosus: Secondary | ICD-10-CM | POA: Insufficient documentation

## 2020-06-14 NOTE — Assessment & Plan Note (Signed)
Acute resolved episode on chronic.  Neurologically intact on exam. Provided with work note to excuse from 8/11-8/13 due to recurrent migraine.  Additionally discussed monitoring for possible triggers, encouraged to decreasing soda and increasing water intake.  Tylenol/ibuprofen (encourage mindfulness due to previous bypass) PRN as this has been effective, can discuss prophylactic/abortive therapy in the future if become more frequent/uncontrolled with OTC.

## 2020-06-14 NOTE — Assessment & Plan Note (Signed)
Recommended follow-up with her established OB/GYN provider for gynecological exam.

## 2020-07-31 ENCOUNTER — Other Ambulatory Visit: Payer: Self-pay | Admitting: Adult Health

## 2021-01-28 ENCOUNTER — Other Ambulatory Visit: Payer: Self-pay | Admitting: Adult Health

## 2021-02-01 ENCOUNTER — Telehealth: Payer: Self-pay | Admitting: Adult Health

## 2021-02-01 MED ORDER — DIVALPROEX SODIUM ER 500 MG PO TB24: 2500.0000 mg | ORAL_TABLET | Freq: Every day | ORAL | 5 refills | Status: DC

## 2021-02-01 NOTE — Telephone Encounter (Signed)
Depakote refilled x 6 months. Her FU is in Aug.

## 2021-02-01 NOTE — Addendum Note (Signed)
Addended by: Minna Antis on: 02/01/2021 01:41 PM   Modules accepted: Orders

## 2021-02-01 NOTE — Telephone Encounter (Signed)
Pt has scheduled her 6 month f/u, she is requesting a refill on her divalproex (DEPAKOTE ER) 500 MG 24 hr tablet to [id number]

## 2021-04-19 ENCOUNTER — Encounter: Payer: Self-pay | Admitting: Nurse Practitioner

## 2021-06-07 ENCOUNTER — Encounter: Payer: Self-pay | Admitting: Nurse Practitioner

## 2021-06-14 ENCOUNTER — Ambulatory Visit: Payer: Medicaid Other | Admitting: Adult Health

## 2021-06-14 ENCOUNTER — Encounter: Payer: Self-pay | Admitting: Adult Health

## 2021-06-14 VITALS — BP 154/92 | HR 85 | Ht 64.0 in | Wt 175.8 lb

## 2021-06-14 DIAGNOSIS — G40909 Epilepsy, unspecified, not intractable, without status epilepticus: Secondary | ICD-10-CM

## 2021-06-14 DIAGNOSIS — Z5181 Encounter for therapeutic drug level monitoring: Secondary | ICD-10-CM | POA: Diagnosis not present

## 2021-06-14 MED ORDER — DIVALPROEX SODIUM ER 500 MG PO TB24: 2500.0000 mg | ORAL_TABLET | Freq: Every day | ORAL | 12 refills | Status: DC

## 2021-06-14 NOTE — Patient Instructions (Signed)
Continue Depakote- Blood work today If you have any seizure events please let us know.   

## 2021-06-14 NOTE — Progress Notes (Signed)
PATIENT: Isabella Jenkins DOB: 1976/07/31  REASON FOR VISIT: follow up HISTORY FROM: patient Primary neurologist: Dr. Krista Blue  HISTORY OF PRESENT ILLNESS: Today 06/14/21:  Isabella Jenkins is a 45 year old female with a history of seizures.  She returns today for follow-up.  The patient is currently taking Depakote 2500 mg daily.  She denies any seizure events.  Reports that she is tolerating her medication well.  No change in her gait or balance.  She does report in June her 71-year-old daughter contracted meningitis and passed away.  She returns today for an evaluation.   11/25/19: Isabella Jenkins is a 45 year old female with a history of seizures.  She returns today for follow-up.  At the last visit Depakote was increased to 2500 milligrams daily.  She reports that she has had 2 seizures in the last month.  They have both occurred at night.  She states that she woke up around 3 AM to go the bathroom.  She came back and got the bed.  She woke up around 5 AM.  She felt spacey, she saw that stuff was off her dresser and on the floor.  She had a scrape on her arm.  She had urinated in the bed.  And she states that she felt weak.  She states that she drinks 3-6 beers a night.  She returns today for an evaluation  HISTORY  08/31/19: Isabella Jenkins is a 45 year old female with a history of seizures.  She returns today for follow-up.  She is currently on Depakote 2000 mg extended release daily.  She reports that she had a "mild seizure" on Friday.  She states that she was up Thursday night with diarrhea.  She reports that she is lactose intolerance and had dairy product.  She states in the past she is only had a seizure if she missed her medication.  She states that the seizure was not witnessed but when she woke up she was in her bed and her muscles were sore and tight.  She did not bite her gum or tongue.  No loss of urine.  She states that she did feel like she had a seizure as this is how she is felt before.   Reports that she tolerates the medication well.  She is able to complete all ADLs independently.  She operates a Teacher, music without difficulty.  She returns today for an evaluation.     REVIEW OF SYSTEMS: Out of a complete 14 system review of symptoms, the patient complains only of the following symptoms, and all other reviewed systems are negative.  See HPI  ALLERGIES: Allergies  Allergen Reactions   Ibuprofen Other (See Comments)    Can't take due to gastric surgery.    Tramadol Other (See Comments)    Can't take this medication with seizure medication.    Flexeril [Cyclobenzaprine] Anxiety    HOME MEDICATIONS: Outpatient Medications Prior to Visit  Medication Sig Dispense Refill   acetaminophen (TYLENOL) 500 MG tablet Take 1,000-1,500 mg by mouth every 6 (six) hours as needed for moderate pain or headache.     divalproex (DEPAKOTE ER) 500 MG 24 hr tablet Take 5 tablets (2,500 mg total) by mouth daily. 150 tablet 5   Iron, Ferrous Sulfate, 325 (65 Fe) MG TABS Take 1 tablet by mouth every other day. 30 tablet 3   norethindrone-ethinyl estradiol (JUNEL FE 1/20) 1-20 MG-MCG tablet Take 1 tablet by mouth daily. 1 Package 11   omeprazole (PRILOSEC) 20 MG capsule TAKE  1 CAPSULE BY MOUTH EVERY DAY 30 capsule 6   No facility-administered medications prior to visit.    PAST MEDICAL HISTORY: Past Medical History:  Diagnosis Date   Ankle fracture    Anxiety    Blood transfusion without reported diagnosis    Gastric bypass status for obesity    Hypertension    Mood swings    Seizures ([place])    Last seizure 8/15    PAST SURGICAL HISTORY: Past Surgical History:  Procedure Laterality Date   ANKLE CLOSED REDUCTION Right 06/10/2014   Procedure: CLOSED REDUCTION AND SPLINTING RIGHT TALUS FRACTURE DISLOCATION;  Surgeon: Rozanna Box, MD;  Location: [place];  Service: Orthopedics;  Laterality: Right;   CAST APPLICATION Right [id number]   Procedure: CAST APPLICATION;  Surgeon: Rozanna Box, MD;  Location: [place];  Service: Orthopedics;  Laterality: Right;   DILATION AND CURETTAGE OF UTERUS  09/2018   retained products of conception   EXTERNAL FIXATION LEG Left 06/10/2014   Procedure: EXTERNAL FIXATION LEFT PILON FRACTURE;  Surgeon: Rozanna Box, MD;  Location: [place];  Service: Orthopedics;  Laterality: Left;   GASTRIC BYPASS     I & D EXTREMITY Left 06/10/2014   Procedure: IRRIGATION AND DEBRIDEMENT LEFT OPEN FRACTURE; INCLUDING BONE;  Surgeon: Rozanna Box, MD;  Location: [place];  Service: Orthopedics;  Laterality: Left;   ORIF ANKLE FRACTURE Left 06/24/2014   Procedure: OPEN REDUCTION INTERNAL FIXATION (ORIF) TIBIA PILON  FRACTURE WITH REMOVAL OF EXTERNAL FIXATURE ;  Surgeon: Rozanna Box, MD;  Location: [place];  Service: Orthopedics;  Laterality: Left;   ORIF FIBULA FRACTURE Left 06/10/2014   Procedure: OPEN REDUCTION INTERNAL FIXATION (ORIF) PILON FRACTURE, FIBULA ONLY;  Surgeon: Rozanna Box, MD;  Location: [place];  Service: Orthopedics;  Laterality: Left;   WISDOM TOOTH EXTRACTION      FAMILY HISTORY: Family History  Problem Relation Age of Onset   High blood pressure Mother    High Cholesterol Mother    Stroke Father    High blood pressure Father    Hypertension Sister    Seizures Maternal Grandmother     SOCIAL HISTORY: Social History   Socioeconomic History   Marital status: Single    Spouse name: Not on file   Number of children: 3   Years of education: 12   Highest education level: 12th grade  Occupational History   Occupation: Chemical engineer [employer]  Tobacco Use   Smoking status: Some Days    Packs/day: 0.10    Types: Cigarettes   Smokeless tobacco: Never  Vaping Use   Vaping Use: Never used  Substance and Sexual Activity   Alcohol use: Yes    Alcohol/week: 3.0 standard drinks    Types: 3 Cans of beer per week    Comment: Usually 3-4 a night   Drug use: Never   Sexual activity: Yes    Birth control/protection: Pill  Other Topics  Concern   Not on file  Social History Narrative   Patient lives at home with her children . Patient is divorcing. Patient works full time at [employer].    Education high school.   Right handed.   Caffeine one cup of coffee daily.   Social Determinants of Health   Financial Resource Strain: Not on file  Food Insecurity: Not on file  Transportation Needs: Not on file  Physical Activity: Not on file  Stress: Not on file  Social Connections: Not on file  Intimate Partner  Violence: Not on file      PHYSICAL EXAM  Vitals:   06/14/21 1318  BP: (!) 154/92  Pulse: 85  Weight: 175 lb 12.8 oz (79.7 kg)  Height: '5\' 4"'$  (1.626 m)   Body mass index is 30.18 kg/m.  Generalized: Well developed, in no acute distress   Neurological examination  Mentation: Alert oriented to time, place, history taking. Follows all commands speech and language fluent Cranial nerve II-XII: Pupils were equal round reactive to light. Extraocular movements were full, visual field were full on confrontational test. Head turning and shoulder shrug  were normal and symmetric. Motor: The motor testing reveals 5 over 5 strength of all 4 extremities. Good symmetric motor tone is noted throughout.  Sensory: Sensory testing is intact to soft touch on all 4 extremities. No evidence of extinction is noted.  Coordination: Cerebellar testing reveals good finger-nose-finger and heel-to-shin bilaterally.  Gait and station: Gait is normal. Reflexes: Deep tendon reflexes are symmetric and normal bilaterally.   DIAGNOSTIC DATA (LABS, IMAGING, TESTING) - I reviewed patient records, labs, notes, testing and imaging myself where available.  Lab Results  Component Value Date   WBC 7.0 11/25/2019   HGB 11.8 11/25/2019   HCT 36.8 11/25/2019   MCV 91 11/25/2019   PLT 329 11/25/2019      Component Value Date/Time   NA 138 11/25/2019 1336   K 4.7 11/25/2019 1336   CL 104 11/25/2019 1336   CO2 23 11/25/2019 1336   GLUCOSE  78 11/25/2019 1336   GLUCOSE 82 08/03/2019 1521   BUN 15 11/25/2019 1336   CREATININE 0.79 11/25/2019 1336   CREATININE 0.74 08/03/2019 1521   CALCIUM 8.8 11/25/2019 1336   PROT 6.6 11/25/2019 1336   ALBUMIN 3.9 11/25/2019 1336   AST 16 11/25/2019 1336   AST 15 08/03/2019 1521   ALT 8 11/25/2019 1336   ALT 9 08/03/2019 1521   ALKPHOS 64 11/25/2019 1336   BILITOT 0.5 11/25/2019 1336   BILITOT 0.3 08/03/2019 1521   GFRNONAA 93 11/25/2019 1336   GFRNONAA >60 08/03/2019 1521   GFRAA 108 11/25/2019 1336   GFRAA >60 08/03/2019 1521      ASSESSMENT AND PLAN 45 y.o. year old female  has a past medical history of Ankle fracture, Anxiety, Blood transfusion without reported diagnosis, Gastric bypass status for obesity, Hypertension, Mood swings, and Seizures ([place]). here with:  1.  Seizures  -Continue Depakote extended release 2500 mg daily -Blood work today, CBC, CMP and Depakote level -Advised if she has any seizure event she should let us know -Follow-up in 1 year or sooner if needed   I spent 30 minutes of face-to-face and non-face-to-face time with patient.  This included previsit chart review, medication review and discussing with the patient the recent passing of her daughter and the effect she has had from this.  Ward Givens, MSN, NP-C 06/14/2021, 1:31 PM [facility] [address] [place],  [address] [phone]

## 2021-06-15 LAB — COMPREHENSIVE METABOLIC PANEL
ALT: 12 IU/L (ref 0–32)
AST: 17 IU/L (ref 0–40)
Albumin/Globulin Ratio: 1.3 (ref 1.2–2.2)
Albumin: 4 g/dL (ref 3.8–4.8)
Alkaline Phosphatase: 91 IU/L (ref 44–121)
BUN/Creatinine Ratio: 16 (ref 9–23)
BUN: 14 mg/dL (ref 6–24)
Bilirubin Total: 0.7 mg/dL (ref 0.0–1.2)
CO2: 24 mmol/L (ref 20–29)
Calcium: 8.7 mg/dL (ref 8.7–10.2)
Chloride: 104 mmol/L (ref 96–106)
Creatinine, Ser: 0.89 mg/dL (ref 0.57–1.00)
Globulin, Total: 3 g/dL (ref 1.5–4.5)
Glucose: 87 mg/dL (ref 65–99)
Potassium: 4.3 mmol/L (ref 3.5–5.2)
Sodium: 141 mmol/L (ref 134–144)
Total Protein: 7 g/dL (ref 6.0–8.5)
eGFR: 83 mL/min/{1.73_m2} (ref >59)

## 2021-06-15 LAB — CBC WITH DIFFERENTIAL/PLATELET
Basophils Absolute: 0.1 10*3/uL (ref 0.0–0.2)
Basos: 1 %
EOS (ABSOLUTE): 0.2 10*3/uL (ref 0.0–0.4)
Eos: 4 %
Hematocrit: 39.2 % (ref 34.0–46.6)
Hemoglobin: 13.3 g/dL (ref 11.1–15.9)
Immature Grans (Abs): 0 10*3/uL (ref 0.0–0.1)
Immature Granulocytes: 0 %
Lymphocytes Absolute: 1.6 10*3/uL (ref 0.7–3.1)
Lymphs: 23 %
MCH: 33 pg (ref 26.6–33.0)
MCHC: 33.9 g/dL (ref 31.5–35.7)
MCV: 97 fL (ref 79–97)
Monocytes Absolute: 0.6 10*3/uL (ref 0.1–0.9)
Monocytes: 8 %
Neutrophils Absolute: 4.3 10*3/uL (ref 1.4–7.0)
Neutrophils: 64 %
Platelets: 202 10*3/uL (ref 150–450)
RBC: 4.03 x10E6/uL (ref 3.77–5.28)
RDW: 13.1 % (ref 11.7–15.4)
WBC: 6.7 10*3/uL (ref 3.4–10.8)

## 2021-06-15 LAB — VALPROIC ACID LEVEL: Valproic Acid Lvl: 59 ug/mL (ref 50–100)

## 2022-04-03 ENCOUNTER — Encounter: Payer: Self-pay | Admitting: *Deleted

## 2022-06-14 ENCOUNTER — Ambulatory Visit (INDEPENDENT_AMBULATORY_CARE_PROVIDER_SITE_OTHER): Payer: Medicaid Other | Admitting: Adult Health

## 2022-06-14 ENCOUNTER — Encounter: Payer: Self-pay | Admitting: Adult Health

## 2022-06-14 VITALS — BP 158/96 | HR 74 | Ht 64.0 in | Wt 149.4 lb

## 2022-06-14 DIAGNOSIS — Z5181 Encounter for therapeutic drug level monitoring: Secondary | ICD-10-CM

## 2022-06-14 DIAGNOSIS — G40909 Epilepsy, unspecified, not intractable, without status epilepticus: Secondary | ICD-10-CM

## 2022-06-14 MED ORDER — DIVALPROEX SODIUM ER 500 MG PO TB24: 2500.0000 mg | ORAL_TABLET | Freq: Every day | ORAL | 12 refills | Status: DC

## 2022-06-14 NOTE — Progress Notes (Signed)
PATIENT: Isabella Jenkins DOB: January 03, 1976  REASON FOR VISIT: follow up HISTORY FROM: patient Primary neurologist: Dr. Krista Blue  Chief Complaint  Patient presents with   Rm 19    Pt in 19  Pt here for seizure f/u  Pt states no seizures in the last year  Pt states her BP has been elevated . Pt states she bruises  easily      HISTORY OF PRESENT ILLNESS: Today 06/14/22:  Isabella Jenkins is a 46 year old female with a history of seizures.  She returns today for follow-up.  She denies any seizure events.  Continues on Depakote 2500 mg daily.  No change in her gait or balance.  Able to complete all ADLs independently.  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 She returns today for evaluation.  06/14/21: Isabella Jenkins is a 46 year old female with a history of seizures.  She returns today for follow-up.  The patient is currently taking Depakote 2500 mg daily.  She denies any seizure events.  Reports that she is tolerating her medication well.  No change in her gait or balance.  She does report in June her 70-year-old daughter contracted meningitis and passed away.  She returns today for an evaluation.   11/25/19: Isabella Jenkins is a 46 year old female with a history of seizures.  She returns today for follow-up.  At the last visit Depakote was increased to 2500 milligrams daily.  She reports that she has had 2 seizures in the last month.  They have both occurred at night.  She states that she woke up around 3 AM to go the bathroom.  She came back and got the bed.  She woke up around 5 AM.  She felt spacey, she saw that stuff was off her dresser and on the floor.  She had a scrape on her arm.  She had urinated in the bed.  And she states that she felt weak.  She states that she drinks 3-6 beers a night.  She returns today for an evaluation  HISTORY  08/31/19: Isabella Jenkins is a 46 year old female with a history of seizures.  She returns today for follow-up.  She is currently on Depakote 2000 mg extended release  daily.  She reports that she had a "mild seizure" on Friday.  She states that she was up Thursday night with diarrhea.  She reports that she is lactose intolerance and had dairy product.  She states in the past she is only had a seizure if she missed her medication.  She states that the seizure was not witnessed but when she woke up she was in her bed and her muscles were sore and tight.  She did not bite her gum or tongue.  No loss of urine.  She states that she did feel like she had a seizure as this is how she is felt before.  Reports that she tolerates the medication well.  She is able to complete all ADLs independently.  She operates a Teacher, music without difficulty.  She returns today for an evaluation.     REVIEW OF SYSTEMS: Out of a complete 14 system review of symptoms, the patient complains only of the following symptoms, and all other reviewed systems are negative.  See HPI  ALLERGIES: Allergies  Allergen Reactions   Ibuprofen Other (See Comments)    Can't take due to gastric surgery.    Tramadol Other (See Comments)    Can't take this medication with seizure medication.    Flexeril [Cyclobenzaprine]  Anxiety    HOME MEDICATIONS: Outpatient Medications Prior to Visit  Medication Sig Dispense Refill   acetaminophen (TYLENOL) 500 MG tablet Take 1,000-1,500 mg by mouth every 6 (six) hours as needed for moderate pain or headache.     divalproex (DEPAKOTE ER) 500 MG 24 hr tablet Take 5 tablets (2,500 mg total) by mouth daily. 150 tablet 12   Iron, Ferrous Sulfate, 325 (65 Fe) MG TABS Take 1 tablet by mouth every other day. 30 tablet 3   norethindrone-ethinyl estradiol (JUNEL FE 1/20) 1-20 MG-MCG tablet Take 1 tablet by mouth daily. 1 Package 11   omeprazole (PRILOSEC) 20 MG capsule TAKE 1 CAPSULE BY MOUTH EVERY DAY 30 capsule 6   No facility-administered medications prior to visit.    PAST MEDICAL HISTORY: Past Medical History:  Diagnosis Date   Ankle fracture    Anxiety     Blood transfusion without reported diagnosis    Gastric bypass status for obesity    Hypertension    Mood swings    Seizures ([place])    Last seizure 8/15    PAST SURGICAL HISTORY: Past Surgical History:  Procedure Laterality Date   ANKLE CLOSED REDUCTION Right 06/10/2014   Procedure: CLOSED REDUCTION AND SPLINTING RIGHT TALUS FRACTURE DISLOCATION;  Surgeon: Rozanna Box, MD;  Location: [place];  Service: Orthopedics;  Laterality: Right;   CAST APPLICATION Right 1/49/7026   Procedure: CAST APPLICATION;  Surgeon: Rozanna Box, MD;  Location: [place];  Service: Orthopedics;  Laterality: Right;   DILATION AND CURETTAGE OF UTERUS  09/2018   retained products of conception   EXTERNAL FIXATION LEG Left 06/10/2014   Procedure: EXTERNAL FIXATION LEFT PILON FRACTURE;  Surgeon: Rozanna Box, MD;  Location: [place];  Service: Orthopedics;  Laterality: Left;   GASTRIC BYPASS     I & D EXTREMITY Left 06/10/2014   Procedure: IRRIGATION AND DEBRIDEMENT LEFT OPEN FRACTURE; INCLUDING BONE;  Surgeon: Rozanna Box, MD;  Location: [place];  Service: Orthopedics;  Laterality: Left;   ORIF ANKLE FRACTURE Left 06/24/2014   Procedure: OPEN REDUCTION INTERNAL FIXATION (ORIF) TIBIA PILON  FRACTURE WITH REMOVAL OF EXTERNAL FIXATURE ;  Surgeon: Rozanna Box, MD;  Location: [place];  Service: Orthopedics;  Laterality: Left;   ORIF FIBULA FRACTURE Left 06/10/2014   Procedure: OPEN REDUCTION INTERNAL FIXATION (ORIF) PILON FRACTURE, FIBULA ONLY;  Surgeon: Rozanna Box, MD;  Location: [place];  Service: Orthopedics;  Laterality: Left;   WISDOM TOOTH EXTRACTION      FAMILY HISTORY: Family History  Problem Relation Age of Onset   High blood pressure Mother    High Cholesterol Mother    Stroke Father    High blood pressure Father    Hypertension Sister    Seizures Maternal Grandmother     SOCIAL HISTORY: Social History   Socioeconomic History   Marital status: Single    Spouse name: Not on file   Number of  children: 3   Years of education: 12   Highest education level: 12th grade  Occupational History   Occupation: Chemical engineer [employer]  Tobacco Use   Smoking status: Some Days    Packs/day: 0.10    Types: Cigarettes   Smokeless tobacco: Never  Vaping Use   Vaping Use: Never used  Substance and Sexual Activity   Alcohol use: Yes    Alcohol/week: 3.0 standard drinks of alcohol    Types: 3 Cans of beer per week    Comment: Usually 3-4 a  night   Drug use: Never   Sexual activity: Yes    Birth control/protection: Pill  Other Topics Concern   Not on file  Social History Narrative   Patient lives at home with her children . Patient is divorcing. Patient works full time at [employer].    Education high school.   Right handed.   Caffeine one cup of coffee daily.   Social Determinants of Health   Financial Resource Strain: Not on file  Food Insecurity: Not on file  Transportation Needs: Not on file  Physical Activity: Not on file  Stress: Not on file  Social Connections: Not on file  Intimate Partner Violence: Not on file      PHYSICAL EXAM  Vitals:   06/14/22 1406  BP: (!) 158/96  Pulse: 74  Weight: 149 lb 6.4 oz (67.8 kg)  Height: '5\' 4"'$  (1.626 m)    Body mass index is 25.64 kg/m.  Generalized: Well developed, in no acute distress   Neurological examination  Mentation: Alert oriented to time, place, history taking. Follows all commands speech and language fluent Cranial nerve II-XII: Pupils were equal round reactive to light. Extraocular movements were full, visual field were full on confrontational test. Head turning and shoulder shrug  were normal and symmetric. Motor: The motor testing reveals 5 over 5 strength of all 4 extremities. Good symmetric motor tone is noted throughout.  Sensory: Sensory testing is intact to soft touch on all 4 extremities. No evidence of extinction is noted.  Coordination: Cerebellar testing reveals good finger-nose-finger and heel-to-shin  bilaterally.  Gait and station: Gait is normal. Reflexes: Deep tendon reflexes are symmetric and normal bilaterally.   DIAGNOSTIC DATA (LABS, IMAGING, TESTING) - I reviewed patient records, labs, notes, testing and imaging myself where available.  Lab Results  Component Value Date   WBC 6.7 06/14/2021   HGB 13.3 06/14/2021   HCT 39.2 06/14/2021   MCV 97 06/14/2021   PLT 202 06/14/2021      Component Value Date/Time   NA 141 06/14/2021 1403   K 4.3 06/14/2021 1403   CL 104 06/14/2021 1403   CO2 24 06/14/2021 1403   GLUCOSE 87 06/14/2021 1403   GLUCOSE 82 08/03/2019 1521   BUN 14 06/14/2021 1403   CREATININE 0.89 06/14/2021 1403   CREATININE 0.74 08/03/2019 1521   CALCIUM 8.7 06/14/2021 1403   PROT 7.0 06/14/2021 1403   ALBUMIN 4.0 06/14/2021 1403   AST 17 06/14/2021 1403   AST 15 08/03/2019 1521   ALT 12 06/14/2021 1403   ALT 9 08/03/2019 1521   ALKPHOS 91 06/14/2021 1403   BILITOT 0.7 06/14/2021 1403   BILITOT 0.3 08/03/2019 1521   GFRNONAA 93 11/25/2019 1336   GFRNONAA >60 08/03/2019 1521   GFRAA 108 11/25/2019 1336   GFRAA >60 08/03/2019 1521      ASSESSMENT AND PLAN 46 y.o. year old female  has a past medical history of Ankle fracture, Anxiety, Blood transfusion without reported diagnosis, Gastric bypass status for obesity, Hypertension, Mood swings, and Seizures ([place]). here with:  1.  Seizures  -Continue Depakote extended release 2500 mg daily -Blood work today, CBC, CMP and Depakote level -Advised if she has any seizure event she should let us know -Follow-up in 1 year or sooner if needed   Ward Givens, MSN, NP-C 06/14/2022, 2:02 PM [facility] [address] [place], [place] [address] [phone]

## 2022-06-14 NOTE — Patient Instructions (Signed)
Your Plan:  Continue Depakote Blood work today    Thank you for coming to see [place] at [facility]. I hope we have been able to provide you high quality care today.  You may receive a patient satisfaction survey over the next few weeks. We would appreciate your feedback and comments so that we may continue to improve ourselves and the health of our patients.

## 2022-06-15 LAB — CBC WITH DIFFERENTIAL/PLATELET
Basophils Absolute: 0.1 10*3/uL (ref 0.0–0.2)
Basos: 1 %
EOS (ABSOLUTE): 0.2 10*3/uL (ref 0.0–0.4)
Eos: 3 %
Hematocrit: 40.8 % (ref 34.0–46.6)
Hemoglobin: 13.6 g/dL (ref 11.1–15.9)
Immature Grans (Abs): 0 10*3/uL (ref 0.0–0.1)
Immature Granulocytes: 0 %
Lymphocytes Absolute: 1.3 10*3/uL (ref 0.7–3.1)
Lymphs: 24 %
MCH: 33.6 pg — ABNORMAL HIGH (ref 26.6–33.0)
MCHC: 33.3 g/dL (ref 31.5–35.7)
MCV: 101 fL — ABNORMAL HIGH (ref 79–97)
Monocytes Absolute: 0.4 10*3/uL (ref 0.1–0.9)
Monocytes: 7 %
Neutrophils Absolute: 3.4 10*3/uL (ref 1.4–7.0)
Neutrophils: 65 %
Platelets: 222 10*3/uL (ref 150–450)
RBC: 4.05 x10E6/uL (ref 3.77–5.28)
RDW: 12 % (ref 11.7–15.4)
WBC: 5.3 10*3/uL (ref 3.4–10.8)

## 2022-06-15 LAB — COMPREHENSIVE METABOLIC PANEL
ALT: 38 IU/L — ABNORMAL HIGH (ref 0–32)
AST: 68 IU/L — ABNORMAL HIGH (ref 0–40)
Albumin/Globulin Ratio: 1.6 (ref 1.2–2.2)
Albumin: 4.3 g/dL (ref 3.9–4.9)
Alkaline Phosphatase: 124 IU/L — ABNORMAL HIGH (ref 44–121)
BUN/Creatinine Ratio: 15 (ref 9–23)
BUN: 14 mg/dL (ref 6–24)
Bilirubin Total: 1.1 mg/dL (ref 0.0–1.2)
CO2: 22 mmol/L (ref 20–29)
Calcium: 8.9 mg/dL (ref 8.7–10.2)
Chloride: 101 mmol/L (ref 96–106)
Creatinine, Ser: 0.92 mg/dL (ref 0.57–1.00)
Globulin, Total: 2.7 g/dL (ref 1.5–4.5)
Glucose: 90 mg/dL (ref 70–99)
Potassium: 4.1 mmol/L (ref 3.5–5.2)
Sodium: 137 mmol/L (ref 134–144)
Total Protein: 7 g/dL (ref 6.0–8.5)
eGFR: 79 mL/min/{1.73_m2} (ref >59)

## 2022-06-15 LAB — VALPROIC ACID LEVEL: Valproic Acid Lvl: 66 ug/mL (ref 50–100)

## 2022-06-18 ENCOUNTER — Telehealth: Payer: Self-pay | Admitting: *Deleted

## 2022-06-18 NOTE — Telephone Encounter (Signed)
Spoke to patient gave lab results . Informed patient per Megan,NP please return in a month to get labs rechecked. Pt expressed understanding and thanked me for calling

## 2022-06-18 NOTE — Telephone Encounter (Signed)
-----   Message from Ward Givens, NP sent at 06/18/2022  9:44 AM EDT ----- Please call patient and let her know that alkaline phosphatase, AST and ALT is slightly elevated.  This may be due to Depakote.  We will need to recheck lab work in 1 month.

## 2022-07-23 ENCOUNTER — Encounter: Payer: Self-pay | Admitting: Adult Health

## 2022-07-23 ENCOUNTER — Other Ambulatory Visit: Payer: Self-pay | Admitting: Adult Health

## 2022-07-23 DIAGNOSIS — G40909 Epilepsy, unspecified, not intractable, without status epilepticus: Secondary | ICD-10-CM

## 2022-07-23 DIAGNOSIS — Z5181 Encounter for therapeutic drug level monitoring: Secondary | ICD-10-CM

## 2022-08-07 NOTE — Telephone Encounter (Signed)
Tried to call the patient, but VM was full.

## 2022-08-07 NOTE — Telephone Encounter (Signed)
Pt called back. Stated she will come by Monday for labs on her off day.

## 2023-06-09 NOTE — Progress Notes (Unsigned)
PATIENT: Isabella Jenkins DOB: June 11, 1976  REASON FOR VISIT: follow up HISTORY FROM: patient Primary neurologist: Dr. Terrace Arabia  No chief complaint on file.    HISTORY OF PRESENT ILLNESS: Today 06/09/23:  06/14/22: Ms. Isabella Jenkins is a 47 year old female with a history of seizures.  She returns today for follow-up.  She denies any seizure events.  Continues on Depakote 2500 mg daily.  No change in her gait or balance.  Able to complete all ADLs independently.  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 She returns today for evaluation.  06/14/21: Ms. Isabella Jenkins is a 47 year old female with a history of seizures.  She returns today for follow-up.  The patient is currently taking Depakote 2500 mg daily.  She denies any seizure events.  Reports that she is tolerating her medication well.  No change in her gait or balance.  She does report in June her 18-year-old daughter contracted meningitis and passed away.  She returns today for an evaluation.   11/25/19: Ms. Isabella Jenkins is a 47 year old female with a history of seizures.  She returns today for follow-up.  At the last visit Depakote was increased to 2500 milligrams daily.  She reports that she has had 2 seizures in the last month.  They have both occurred at night.  She states that she woke up around 3 AM to go the bathroom.  She came back and got the bed.  She woke up around 5 AM.  She felt spacey, she saw that stuff was off her dresser and on the floor.  She had a scrape on her arm.  She had urinated in the bed.  And she states that she felt weak.  She states that she drinks 3-6 beers a night.  She returns today for an evaluation  HISTORY  08/31/19: Ms. Isabella Jenkins is a 47 year old female with a history of seizures.  She returns today for follow-up.  She is currently on Depakote 2000 mg extended release daily.  She reports that she had a "mild seizure" on Friday.  She states that she was up Thursday night with diarrhea.  She reports that she is lactose  intolerance and had dairy product.  She states in the past she is only had a seizure if she missed her medication.  She states that the seizure was not witnessed but when she woke up she was in her bed and her muscles were sore and tight.  She did not bite her gum or tongue.  No loss of urine.  She states that she did feel like she had a seizure as this is how she is felt before.  Reports that she tolerates the medication well.  She is able to complete all ADLs independently.  She operates a Librarian, academic without difficulty.  She returns today for an evaluation.     REVIEW OF SYSTEMS: Out of a complete 14 system review of symptoms, the patient complains only of the following symptoms, and all other reviewed systems are negative.  See HPI  ALLERGIES: Allergies  Allergen Reactions   Ibuprofen Other (See Comments)    Can't take due to gastric surgery.    Tramadol Other (See Comments)    Can't take this medication with seizure medication.    Flexeril [Cyclobenzaprine] Anxiety    HOME MEDICATIONS: Outpatient Medications Prior to Visit  Medication Sig Dispense Refill   acetaminophen (TYLENOL) 500 MG tablet Take 1,000-1,500 mg by mouth every 6 (six) hours as needed for moderate pain or headache.  divalproex (DEPAKOTE ER) 500 MG 24 hr tablet Take 5 tablets (2,500 mg total) by mouth daily. 150 tablet 12   Iron, Ferrous Sulfate, 325 (65 Fe) MG TABS Take 1 tablet by mouth every other day. 30 tablet 3   norethindrone-ethinyl estradiol (JUNEL FE 1/20) 1-20 MG-MCG tablet Take 1 tablet by mouth daily. (Patient not taking: Reported on 06/14/2022) 1 Package 11   omeprazole (PRILOSEC) 20 MG capsule TAKE 1 CAPSULE BY MOUTH EVERY DAY (Patient not taking: Reported on 06/14/2022) 30 capsule 6   No facility-administered medications prior to visit.    PAST MEDICAL HISTORY: Past Medical History:  Diagnosis Date   Ankle fracture    Anxiety    Blood transfusion without reported diagnosis    Gastric bypass  status for obesity    Hypertension    Mood swings    Seizures ([facility])    Last seizure 8/15    PAST SURGICAL HISTORY: Past Surgical History:  Procedure Laterality Date   ANKLE CLOSED REDUCTION Right 06/10/2014   Procedure: CLOSED REDUCTION AND SPLINTING RIGHT TALUS FRACTURE DISLOCATION;  Surgeon: Budd Palmer, MD;  [facility];  Service: Orthopedics;  Laterality: Right;   CAST APPLICATION Right 06/24/2014   Procedure: CAST APPLICATION;  Surgeon: Budd Palmer, MD;  Location[facility];  Service: Orthopedics;  Laterality: Right;   DILATION AND CURETTAGE OF UTERUS  09/2018   retained products of conception   EXTERNAL FIXATION LEG Left 06/10/2014   Procedure: EXTERNAL FIXATION LEFT PILON FRACTURE;  Surgeon: Budd Palmer, MD;  Location: [facility];  Service: Orthopedics;  Laterality: Left;   GASTRIC BYPASS     I & D EXTREMITY Left 06/10/2014   Procedure: IRRIGATION AND DEBRIDEMENT LEFT OPEN FRACTURE; INCLUDING BONE;  Surgeon: Budd Palmer, MD;  Location[facility];  Service: Orthopedics;  Laterality: Left;   ORIF ANKLE FRACTURE Left 06/24/2014   Procedure: OPEN REDUCTION INTERNAL FIXATION (ORIF) TIBIA PILON  FRACTURE WITH REMOVAL OF EXTERNAL FIXATURE ;  Surgeon: Budd Palmer, MD;  [facility];  Service: Orthopedics;  Laterality: Left;   ORIF FIBULA FRACTURE Left 06/10/2014   Procedure: OPEN REDUCTION INTERNAL FIXATION (ORIF) PILON FRACTURE, FIBULA ONLY;  Surgeon: Budd Palmer, MD;  [facility];  Service: Orthopedics;  Laterality: Left;   WISDOM TOOTH EXTRACTION      FAMILY HISTORY: Family History  Problem Relation Age of Onset   High blood pressure Mother    High Cholesterol Mother    Stroke Father    High blood pressure Father    Hypertension Sister    Seizures Maternal Grandmother     SOCIAL HISTORY: Social History   Socioeconomic History   Marital status: Single    Spouse name: Not on file   Number of children: 3   Years of education: 12   Highest education level:  12th grade  Occupational History   Occupation: Dietitian [employer]  Tobacco Use   Smoking status: Some Days    Current packs/day: 0.10    Types: Cigarettes   Smokeless tobacco: Never  Vaping Use   Vaping status: Never Used  Substance and Sexual Activity   Alcohol use: Yes    Alcohol/week: 3.0 standard drinks of alcohol    Types: 3 Cans of beer per week    Comment: Usually 3-4 a night   Drug use: Never   Sexual activity: Yes    Birth control/protection: Pill  Other Topics Concern   Not on file  Social History Narrative  Patient lives at home with her children . Patient is divorcing. Patient works full time at [employer].    Education high school.   Right handed.   Caffeine one cup of coffee daily.   Social Determinants of Health   Financial Resource Strain: Not on file  Food Insecurity: Not on file  Transportation Needs: Not on file  Physical Activity: Not on file  Stress: Not on file  Social Connections: Not on file  Intimate Partner Violence: Not on file      PHYSICAL EXAM  There were no vitals filed for this visit.   There is no height or weight on file to calculate BMI.  Generalized: Well developed, in no acute distress   Neurological examination  Mentation: Alert oriented to time, place, history taking. Follows all commands speech and language fluent Cranial nerve II-XII: Pupils were equal round reactive to light. Extraocular movements were full, visual field were full on confrontational test. Head turning and shoulder shrug  were normal and symmetric. Motor: The motor testing reveals 5 over 5 strength of all 4 extremities. Good symmetric motor tone is noted throughout.  Sensory: Sensory testing is intact to soft touch on all 4 extremities. No evidence of extinction is noted.  Coordination: Cerebellar testing reveals good finger-nose-finger and heel-to-shin bilaterally.  Gait and station: Gait is normal. Reflexes: Deep tendon reflexes are symmetric and normal  bilaterally.   DIAGNOSTIC DATA (LABS, IMAGING, TESTING) - I reviewed patient records, labs, notes, testing and imaging myself where available.  Lab Results  Component Value Date   WBC 5.3 06/14/2022   HGB 13.6 06/14/2022   HCT 40.8 06/14/2022   MCV 101 (H) 06/14/2022   PLT 222 06/14/2022      Component Value Date/Time   NA 137 06/14/2022 1425   K 4.1 06/14/2022 1425   CL 101 06/14/2022 1425   CO2 22 06/14/2022 1425   GLUCOSE 90 06/14/2022 1425   GLUCOSE 82 08/03/2019 1521   BUN 14 06/14/2022 1425   CREATININE 0.92 06/14/2022 1425   CREATININE 0.74 08/03/2019 1521   CALCIUM 8.9 06/14/2022 1425   PROT 7.0 06/14/2022 1425   ALBUMIN 4.3 06/14/2022 1425   AST 68 (H) 06/14/2022 1425   AST 15 08/03/2019 1521   ALT 38 (H) 06/14/2022 1425   ALT 9 08/03/2019 1521   ALKPHOS 124 (H) 06/14/2022 1425   BILITOT 1.1 06/14/2022 1425   BILITOT 0.3 08/03/2019 1521   GFRNONAA 93 11/25/2019 1336   GFRNONAA >60 08/03/2019 1521   GFRAA 108 11/25/2019 1336   GFRAA >60 08/03/2019 1521      ASSESSMENT AND PLAN 47 y.o. year old female  has a past medical history of Ankle fracture, Anxiety, Blood transfusion without reported diagnosis, Gastric bypass status for obesity, Hypertension, Mood swings, and Seizures ([facility]). here with:  1.  Seizures  -Continue Depakote extended release 2500 mg daily -Blood work today, CBC, CMP and Depakote level -Advised if she has any seizure event she should let us know -Follow-up in 1 year or sooner if needed   Butch Penny, MSN, NP-C 06/09/2023, 3:40 PM [facility] [address], [address] [place], [place] [address] [phone]

## 2023-06-10 ENCOUNTER — Ambulatory Visit (INDEPENDENT_AMBULATORY_CARE_PROVIDER_SITE_OTHER): Payer: Medicaid Other | Admitting: Adult Health

## 2023-06-10 ENCOUNTER — Encounter: Payer: Self-pay | Admitting: Adult Health

## 2023-06-10 VITALS — BP 145/94 | HR 87 | Ht 64.0 in | Wt 140.0 lb

## 2023-06-10 DIAGNOSIS — G40909 Epilepsy, unspecified, not intractable, without status epilepticus: Secondary | ICD-10-CM

## 2023-06-10 DIAGNOSIS — Z5181 Encounter for therapeutic drug level monitoring: Secondary | ICD-10-CM

## 2023-06-10 NOTE — Patient Instructions (Signed)
Your Plan:  Continue Depakote Blood work today Make an appt with PCP If your symptoms worsen or you develop new symptoms please let us know.    Thank you for coming to see [place] at [facility]. I hope we have been able to provide you high quality care today.  You may receive a patient satisfaction survey over the next few weeks. We would appreciate your feedback and comments so that we may continue to improve ourselves and the health of our patients.

## 2023-06-10 NOTE — Progress Notes (Signed)
Chart reviewed, agree above plan ?

## 2023-06-30 ENCOUNTER — Other Ambulatory Visit: Payer: Self-pay | Admitting: Adult Health

## 2023-07-03 NOTE — Telephone Encounter (Signed)
Rx refilled per last office visit note.

## 2023-07-17 ENCOUNTER — Telehealth: Payer: Self-pay | Admitting: *Deleted

## 2023-07-17 DIAGNOSIS — R748 Abnormal levels of other serum enzymes: Secondary | ICD-10-CM

## 2023-07-17 NOTE — Telephone Encounter (Signed)
Spoke to patient. She will come next Monday 9/23 around 10 am for liver function recheck.

## 2023-07-17 NOTE — Telephone Encounter (Signed)
-----   Message from [facility] sent at 07/15/2023  8:45 AM EDT ----- Please have patient come back in to check liver function ----- Message ----- From: Butch Penny, NP Sent: 07/14/2023  12:00 AM EDT To: Butch Penny, NP  Liver function

## 2023-07-22 ENCOUNTER — Other Ambulatory Visit (INDEPENDENT_AMBULATORY_CARE_PROVIDER_SITE_OTHER): Payer: Self-pay

## 2023-07-22 DIAGNOSIS — R748 Abnormal levels of other serum enzymes: Secondary | ICD-10-CM

## 2023-07-22 DIAGNOSIS — Z0289 Encounter for other administrative examinations: Secondary | ICD-10-CM

## 2023-07-23 LAB — HEPATIC FUNCTION PANEL
ALT: 32 IU/L (ref 0–32)
AST: 52 IU/L — ABNORMAL HIGH (ref 0–40)
Albumin: 3.7 g/dL — ABNORMAL LOW (ref 3.9–4.9)
Alkaline Phosphatase: 120 IU/L (ref 44–121)
Bilirubin Total: 0.6 mg/dL (ref 0.0–1.2)
Bilirubin, Direct: 0.2 mg/dL (ref 0.00–0.40)
Total Protein: 6.5 g/dL (ref 6.0–8.5)

## 2023-12-24 ENCOUNTER — Telehealth (INDEPENDENT_AMBULATORY_CARE_PROVIDER_SITE_OTHER): Payer: Self-pay | Admitting: Primary Care

## 2023-12-24 NOTE — Telephone Encounter (Signed)
 Called pt to confirm atp. Pt did not answer and could not leave VM.

## 2023-12-30 ENCOUNTER — Telehealth (INDEPENDENT_AMBULATORY_CARE_PROVIDER_SITE_OTHER): Payer: Self-pay | Admitting: Primary Care

## 2023-12-30 NOTE — Telephone Encounter (Signed)
 Called pt to remind them about atp. Pt will be present

## 2023-12-31 ENCOUNTER — Ambulatory Visit (INDEPENDENT_AMBULATORY_CARE_PROVIDER_SITE_OTHER): Payer: Medicaid Other | Admitting: Primary Care

## 2023-12-31 VITALS — BP 122/82 | HR 90 | Temp 98.0°F | Resp 14 | Ht 64.0 in | Wt 135.8 lb

## 2023-12-31 DIAGNOSIS — F332 Major depressive disorder, recurrent severe without psychotic features: Secondary | ICD-10-CM | POA: Diagnosis not present

## 2023-12-31 DIAGNOSIS — K921 Melena: Secondary | ICD-10-CM | POA: Diagnosis not present

## 2023-12-31 DIAGNOSIS — Z7689 Persons encountering health services in other specified circumstances: Secondary | ICD-10-CM

## 2023-12-31 NOTE — Progress Notes (Signed)
 New Patient Office Visit  Subjective    Patient ID: Isabella Jenkins female  DOB: [birth year]  Age: 48 y.o. MRN: [id number]   CC:  Establish care HPI  Started crying daughter passed she was 35 years old she had spinal meningitis and positive for COVID (June) said mommy my head hurts took her to ED admitted her few hours later on life support than passed.  Current Outpatient Medications on File Prior to Visit  Medication Sig Dispense Refill   divalproex (DEPAKOTE ER) 500 MG 24 hr tablet TAKE 5 TABLETS (2500 MG TOTAL) BY MOUTH DAILY 450 tablet 4   No current facility-administered medications on file prior to visit.     Allergies  Allergen Reactions   Ibuprofen Other (See Comments)    Can't take due to gastric surgery.    Tramadol Other (See Comments)    Can't take this medication with seizure medication.    Flexeril [Cyclobenzaprine] Anxiety    Past Medical History:  Diagnosis Date   Ankle fracture    Anxiety    Blood transfusion without reported diagnosis    Gastric bypass status for obesity    Hypertension    Mood swings    Seizures (HCC)    Last seizure 8/15     Past Surgical History:  Procedure Laterality Date   ANKLE CLOSED REDUCTION Right 06/10/2014   Procedure: CLOSED REDUCTION AND SPLINTING RIGHT TALUS FRACTURE DISLOCATION;  Surgeon: Budd Palmer, MD;  Location: [facility] OR;  Service: Orthopedics;  Laterality: Right;   CAST APPLICATION Right 06/24/2014   Procedure: CAST APPLICATION;  Surgeon: Budd Palmer, MD;  Location: [facility] OR;  Service: Orthopedics;  Laterality: Right;   DILATION AND CURETTAGE OF UTERUS  09/2018   retained products of conception   EXTERNAL FIXATION LEG Left 06/10/2014   Procedure: EXTERNAL FIXATION LEFT PILON FRACTURE;  Surgeon: Budd Palmer, MD;  Location: [facility] OR;  Service: Orthopedics;  Laterality: Left;   GASTRIC BYPASS     I & D EXTREMITY Left 06/10/2014   Procedure: IRRIGATION AND DEBRIDEMENT LEFT OPEN FRACTURE; INCLUDING BONE;  Surgeon:  Budd Palmer, MD;  Location: [facility] OR;  Service: Orthopedics;  Laterality: Left;   ORIF ANKLE FRACTURE Left 06/24/2014   Procedure: OPEN REDUCTION INTERNAL FIXATION (ORIF) TIBIA PILON  FRACTURE WITH REMOVAL OF EXTERNAL FIXATURE ;  Surgeon: Budd Palmer, MD;  Location: [facility] OR;  Service: Orthopedics;  Laterality: Left;   ORIF FIBULA FRACTURE Left 06/10/2014   Procedure: OPEN REDUCTION INTERNAL FIXATION (ORIF) PILON FRACTURE, FIBULA ONLY;  Surgeon: Budd Palmer, MD;  Location: [facility] OR;  Service: Orthopedics;  Laterality: Left;   WISDOM TOOTH EXTRACTION       Family History  Problem Relation Age of Onset   High blood pressure Mother    High Cholesterol Mother    Stroke Father    High blood pressure Father    Hypertension Sister    Seizures Maternal Grandmother     Social History   Socioeconomic History   Marital status: Single    Spouse name: Not on file   Number of children: 3   Years of education: 12   Highest education level: GED or equivalent  Occupational History   Occupation: Dietitian Spot  Tobacco Use   Smoking status: Some Days    Current packs/day: 0.25    Types: Cigarettes   Smokeless tobacco: Never  Vaping Use   Vaping status: Never Used  Substance and Sexual Activity   Alcohol use:  Yes    Alcohol/week: 7.0 standard drinks of alcohol    Types: 7 Cans of beer per week   Drug use: Never   Sexual activity: Yes    Birth control/protection: Pill  Other Topics Concern   Not on file  Social History Narrative   Patient lives at home with her children . Patient is divorcing. Patient works full time at [employer].    Education high school.   Right handed.   Caffeine one cup of coffee daily.   Social Drivers of Corporate investment banker Strain: High Risk (12/31/2023)   Overall Financial Resource Strain (CARDIA)    Difficulty of Paying Living Expenses: Very hard  Food Insecurity: Food Insecurity Present (12/31/2023)   Hunger Vital Sign    Worried About Running Out  of Food in the Last Year: Often true    Ran Out of Food in the Last Year: Often true  Transportation Needs: No Transportation Needs (12/31/2023)   PRAPARE - Administrator, Civil Service (Medical): No    Lack of Transportation (Non-Medical): No  Physical Activity: Unknown (12/31/2023)   Exercise Vital Sign    Days of Exercise per Week: 0 days    Minutes of Exercise per Session: Not on file  Stress: Stress Concern Present (12/31/2023)   [employer] of Occupational Health - Occupational Stress Questionnaire    Feeling of Stress : Rather much  Social Connections: Socially Isolated (12/31/2023)   Social Connection and Isolation Panel [NHANES]    Frequency of Communication with Friends and Family: Once a week    Frequency of Social Gatherings with Friends and Family: Once a week    Attends Religious Services: Never    Database administrator or Organizations: No    Attends Engineer, structural: Not on file    Marital Status: Living with partner  Intimate Partner Violence: Not on file   Health Maintenance  Topic Date Due   Pneumococcal Vaccination (1 of 2 - PCV) Never done   DTaP/Tdap/Td vaccine (1 - Tdap) Never done   Pap with HPV screening  05/19/2017   Flu Shot  Never done   COVID-19 Vaccine (1 - 2024-25 season) Never done   Colon Cancer Screening  Never done   Hepatitis C Screening  Completed   HIV Screening  Completed   HPV Vaccine  Aged Out    Objective    BP 122/82 (BP Location: Left Arm, Patient Position: Sitting, Cuff Size: Normal)   Pulse 90   Temp 98 F (36.7 C) (Oral)   Resp 14   Ht 5\' 4"  (1.626 m)   Wt 135 lb 12.8 oz (61.6 kg)   LMP 12/24/2023   SpO2 99%   BMI 23.31 kg/m  BP Readings from Last 3 Encounters:  12/31/23 122/82  06/10/23 (!) 145/94  06/14/22 (!) 158/96   Physical Exam Vitals reviewed.  Constitutional:      Appearance: Normal appearance.  HENT:     Head: Normocephalic.     Right Ear: Tympanic membrane, ear canal and  external ear normal.     Left Ear: Tympanic membrane, ear canal and external ear normal.  Eyes:     Extraocular Movements: Extraocular movements intact.     Pupils: Pupils are equal, round, and reactive to light.  Cardiovascular:     Rate and Rhythm: Normal rate and regular rhythm.  Pulmonary:     Effort: Pulmonary effort is normal.     Breath sounds: Normal breath  sounds.  Abdominal:     General: Abdomen is flat. Bowel sounds are normal.     Palpations: Abdomen is soft.  Genitourinary:    Comments: No external or internal hemorrhoids palpated/felt There was a tear internal right side of anus questionable anal fissure Musculoskeletal:        General: Normal range of motion.     Cervical back: Normal range of motion.  Skin:    General: Skin is warm and dry.  Neurological:     Mental Status: She is oriented to person, place, and time.  Psychiatric:     Comments: depressed      Assessment & Plan:  Willette was seen today for establish care. Jacelyn was seen today for establish care.  Diagnoses and all orders for this visit:  Encounter to establish care  Severe episode of recurrent major depressive disorder, without psychotic features (HCC)-provided information for walk-in clinic on [facility] -     Ambulatory referral to Psychiatry  Hematochezia -     Ambulatory referral to Gastroenterology    Follow-up:  Return in about 6 weeks (around 02/11/2024) for Depression.  The above assessment and management plan was discussed with the patient. The patient verbalized understanding of and has agreed to the management plan. Patient is aware to call the clinic if symptoms fail to improve or worsen. Patient is aware when to return to the clinic for a follow-up visit. Patient educated on when it is appropriate to go to the emergency department.   Gwinda Passe, NP-C

## 2024-01-10 NOTE — Progress Notes (Deleted)
 PATIENT: Isabella Jenkins DOB: [birth year]  REASON FOR VISIT: follow up HISTORY FROM: patient Primary neurologist: Dr. Terrace Arabia  No chief complaint on file.    HISTORY OF PRESENT ILLNESS: Today 01/10/24:  Isabella Jenkins is a 48 y.o. female with a history of Seizures . Returns today for follow-up.  She denies any seizure events.  Continues on Depakote 2500 mg daily.  She does state that she has been having episodes several times a week where she will break out in a sweat feel shaky and legs feel weak.  She states that typically she can eat or drink something and it slowly improves.  She has not seen her primary care since 2020. These episodes only last about 5 minutes.   06/14/22: Isabella Jenkins is a 48 year old female with a history of seizures.  She returns today for follow-up.  She denies any seizure events.  Continues on Depakote 2500 mg daily.  No change in her gait or balance.  Able to complete all ADLs independently.  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 She returns today for evaluation.  06/14/21: Isabella Jenkins is a 48 year old female with a history of seizures.  She returns today for follow-up.  The patient is currently taking Depakote 2500 mg daily.  She denies any seizure events.  Reports that she is tolerating her medication well.  No change in her gait or balance.  She does report in June her 72-year-old daughter contracted meningitis and passed away.  She returns today for an evaluation.   11/25/19: Isabella Jenkins is a 48 year old female with a history of seizures.  She returns today for follow-up.  At the last visit Depakote was increased to 2500 milligrams daily.  She reports that she has had 2 seizures in the last month.  They have both occurred at night.  She states that she woke up around 3 AM to go the bathroom.  She came back and got the bed.  She woke up around 5 AM.  She felt spacey, she saw that stuff was off her dresser and on the floor.  She had a scrape on her arm.  She had  urinated in the bed.  And she states that she felt weak.  She states that she drinks 3-6 beers a night.  She returns today for an evaluation  HISTORY  08/31/19: Isabella Jenkins is a 48 year old female with a history of seizures.  She returns today for follow-up.  She is currently on Depakote 2000 mg extended release daily.  She reports that she had a "mild seizure" on Friday.  She states that she was up Thursday night with diarrhea.  She reports that she is lactose intolerance and had dairy product.  She states in the past she is only had a seizure if she missed her medication.  She states that the seizure was not witnessed but when she woke up she was in her bed and her muscles were sore and tight.  She did not bite her gum or tongue.  No loss of urine.  She states that she did feel like she had a seizure as this is how she is felt before.  Reports that she tolerates the medication well.  She is able to complete all ADLs independently.  She operates a Librarian, academic without difficulty.  She returns today for an evaluation.     REVIEW OF SYSTEMS: Out of a complete 14 system review of symptoms, the patient complains only of the following symptoms, and all other  reviewed systems are negative.  See HPI  ALLERGIES: Allergies  Allergen Reactions   Ibuprofen Other (See Comments)    Can't take due to gastric surgery.    Tramadol Other (See Comments)    Can't take this medication with seizure medication.    Flexeril [Cyclobenzaprine] Anxiety    HOME MEDICATIONS: Outpatient Medications Prior to Visit  Medication Sig Dispense Refill   divalproex (DEPAKOTE ER) 500 MG 24 hr tablet TAKE 5 TABLETS (2500 MG TOTAL) BY MOUTH DAILY 450 tablet 4   No facility-administered medications prior to visit.    PAST MEDICAL HISTORY: Past Medical History:  Diagnosis Date   Ankle fracture    Anxiety    Blood transfusion without reported diagnosis    Gastric bypass status for obesity    Hypertension    Mood swings     Seizures (HCC)    Last seizure 8/15    PAST SURGICAL HISTORY: Past Surgical History:  Procedure Laterality Date   ANKLE CLOSED REDUCTION Right 06/10/2014   Procedure: CLOSED REDUCTION AND SPLINTING RIGHT TALUS FRACTURE DISLOCATION;  Surgeon: Budd Palmer, MD;  Location: [facility] OR;  Service: Orthopedics;  Laterality: Right;   CAST APPLICATION Right 06/24/2014   Procedure: CAST APPLICATION;  Surgeon: Budd Palmer, MD;  Location: [facility] OR;  Service: Orthopedics;  Laterality: Right;   DILATION AND CURETTAGE OF UTERUS  09/2018   retained products of conception   EXTERNAL FIXATION LEG Left 06/10/2014   Procedure: EXTERNAL FIXATION LEFT PILON FRACTURE;  Surgeon: Budd Palmer, MD;  Location: [facility] OR;  Service: Orthopedics;  Laterality: Left;   GASTRIC BYPASS     I & D EXTREMITY Left 06/10/2014   Procedure: IRRIGATION AND DEBRIDEMENT LEFT OPEN FRACTURE; INCLUDING BONE;  Surgeon: Budd Palmer, MD;  Location: [facility] OR;  Service: Orthopedics;  Laterality: Left;   ORIF ANKLE FRACTURE Left 06/24/2014   Procedure: OPEN REDUCTION INTERNAL FIXATION (ORIF) TIBIA PILON  FRACTURE WITH REMOVAL OF EXTERNAL FIXATURE ;  Surgeon: Budd Palmer, MD;  Location: [facility] OR;  Service: Orthopedics;  Laterality: Left;   ORIF FIBULA FRACTURE Left 06/10/2014   Procedure: OPEN REDUCTION INTERNAL FIXATION (ORIF) PILON FRACTURE, FIBULA ONLY;  Surgeon: Budd Palmer, MD;  Location: [facility] OR;  Service: Orthopedics;  Laterality: Left;   WISDOM TOOTH EXTRACTION      FAMILY HISTORY: Family History  Problem Relation Age of Onset   High blood pressure Mother    High Cholesterol Mother    Stroke Father    High blood pressure Father    Hypertension Sister    Seizures Maternal Grandmother     SOCIAL HISTORY: Social History   Socioeconomic History   Marital status: Single    Spouse name: Not on file   Number of children: 3   Years of education: 12   Highest education level: GED or equivalent  Occupational History    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 Patient is divorcing. Patient works full time at [employer].    Education high school.   Right handed.   Caffeine one cup of coffee daily.   Social Drivers of Corporate investment banker Strain: High Risk (12/31/2023)   Overall Financial Resource Strain (CARDIA)    Difficulty of Paying Living Expenses: Very hard  Food Insecurity: Food Insecurity Present (12/31/2023)   Hunger Vital Sign    Worried About Running Out of Food in the Last Year: Often true    Ran Out of Food in the Last Year: Often true  Transportation Needs: No Transportation Needs (12/31/2023)   PRAPARE - Administrator, Civil Service (Medical): No    Lack of Transportation (Non-Medical): No  Physical Activity: Unknown (12/31/2023)   Exercise Vital Sign    Days of Exercise per Week: 0 days    Minutes of Exercise per Session: Not on file  Stress: Stress Concern Present (12/31/2023)   [employer] of Occupational Health - Occupational Stress Questionnaire    Feeling of Stress : Rather much  Social Connections: Socially Isolated (12/31/2023)   Social Connection and Isolation Panel [NHANES]    Frequency of Communication with Friends and Family: Once a week    Frequency of Social Gatherings with Friends and Family: Once a week    Attends Religious Services: Never    Database administrator or Organizations: No    Attends Engineer, structural: Not on file    Marital Status: Living with partner  Intimate Partner  Violence: Not on file      PHYSICAL EXAM  There were no vitals filed for this visit.    There is no height or weight on file to calculate BMI.  Generalized: Well developed, in no acute distress   Neurological examination  Mentation: Alert oriented to time, place, history taking. Follows all commands speech and language fluent Cranial nerve II-XII: Pupils were equal round reactive to light. Extraocular movements were full, visual field were full on confrontational test. Head turning and shoulder shrug  were normal and symmetric. Motor: The motor testing reveals 5 over 5 strength of all 4 extremities. Good symmetric motor tone is noted throughout.  Sensory: Sensory testing is intact to soft touch on all 4 extremities. No evidence of extinction is noted.  Coordination: Cerebellar testing reveals good finger-nose-finger and heel-to-shin bilaterally.  Gait and station: Gait is normal. Reflexes: Deep tendon reflexes are symmetric and normal bilaterally.   DIAGNOSTIC DATA (LABS, IMAGING, TESTING) - I reviewed patient records, labs, notes, testing and imaging myself where available.  Lab Results  Component Value Date   WBC 4.5 06/10/2023   HGB 12.2 06/10/2023   HCT 37.9 06/10/2023   MCV 95 06/10/2023   PLT 213 06/10/2023      Component Value Date/Time   NA 135 06/10/2023 1320   K 4.5 06/10/2023 1320   CL 100 06/10/2023 1320   CO2 23 06/10/2023 1320   GLUCOSE 86 06/10/2023 1320   GLUCOSE 82 08/03/2019 1521   BUN 11 06/10/2023 1320   CREATININE 0.99 06/10/2023 1320   CREATININE 0.74 08/03/2019 1521   CALCIUM 8.9 06/10/2023 1320   PROT 6.5 07/22/2023 1026   ALBUMIN 3.7 (L) 07/22/2023 1026   AST 52 (H) 07/22/2023 1026   AST 15 08/03/2019 1521   ALT 32 07/22/2023 1026   ALT 9 08/03/2019 1521   ALKPHOS 120 07/22/2023 1026   BILITOT 0.6 07/22/2023 1026   BILITOT 0.3 08/03/2019 1521  GFRNONAA 93 11/25/2019 1336   GFRNONAA >60 08/03/2019 1521   GFRAA 108 11/25/2019 1336    GFRAA >60 08/03/2019 1521      ASSESSMENT AND PLAN 48 y.o. year old female  has a past medical history of Ankle fracture, Anxiety, Blood transfusion without reported diagnosis, Gastric bypass status for obesity, Hypertension, Mood swings, and Seizures (HCC). here with:  1.  Seizures  -Continue Depakote extended release 2500 mg daily -Blood work today, CBC, CMP and Depakote level -Advised if she has any seizure event she should let us know  2. Sweating/shaky/weakness  -These events could be related to her blood sugar as she typically improves with food or liquid? -Patient was advised to schedule a follow-up on appointment with her PCP to discuss  -Follow-up in 6-7 months or sooner if needed   Butch Penny, MSN, NP-C 01/10/2024, 10:51 AM [facility] [address] [place], [place] [address] [phone]

## 2024-01-13 ENCOUNTER — Encounter: Payer: Self-pay | Admitting: *Deleted

## 2024-01-13 ENCOUNTER — Ambulatory Visit: Payer: Medicaid Other | Admitting: Adult Health

## 2024-01-14 NOTE — Progress Notes (Unsigned)
 PATIENT: Isabella Jenkins DOB: 1975-11-30  REASON FOR VISIT: follow up HISTORY FROM: patient Primary neurologist: Dr. Terrace Arabia  No chief complaint on file.    HISTORY OF PRESENT ILLNESS: Today 01/14/24:  Isabella Jenkins is a 48 y.o. female with a history of Seizures . Returns today for follow-up.  She denies any seizure events.  Continues on Depakote 2500 mg daily.  She does state that she has been having episodes several times a week where she will break out in a sweat feel shaky and legs feel weak.  She states that typically she can eat or drink something and it slowly improves.  She has not seen her primary care since 2020. These episodes only last about 5 minutes.   06/14/22: Isabella Jenkins is a 48 year old female with a history of seizures.  She returns today for follow-up.  She denies any seizure events.  Continues on Depakote 2500 mg daily.  No change in her gait or balance.  Able to complete all ADLs independently.  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 She returns today for evaluation.  06/14/21: Isabella Jenkins is a 48 year old female with a history of seizures.  She returns today for follow-up.  The patient is currently taking Depakote 2500 mg daily.  She denies any seizure events.  Reports that she is tolerating her medication well.  No change in her gait or balance.  She does report in June her 69-year-old daughter contracted meningitis and passed away.  She returns today for an evaluation.   11/25/19: Isabella Jenkins is a 48 year old female with a history of seizures.  She returns today for follow-up.  At the last visit Depakote was increased to 2500 milligrams daily.  She reports that she has had 2 seizures in the last month.  They have both occurred at night.  She states that she woke up around 3 AM to go the bathroom.  She came back and got the bed.  She woke up around 5 AM.  She felt spacey, she saw that stuff was off her dresser and on the floor.  She had a scrape on her arm.  She had  urinated in the bed.  And she states that she felt weak.  She states that she drinks 3-6 beers a night.  She returns today for an evaluation  HISTORY  08/31/19: Isabella Jenkins is a 48 year old female with a history of seizures.  She returns today for follow-up.  She is currently on Depakote 2000 mg extended release daily.  She reports that she had a "mild seizure" on Friday.  She states that she was up Thursday night with diarrhea.  She reports that she is lactose intolerance and had dairy product.  She states in the past she is only had a seizure if she missed her medication.  She states that the seizure was not witnessed but when she woke up she was in her bed and her muscles were sore and tight.  She did not bite her gum or tongue.  No loss of urine.  She states that she did feel like she had a seizure as this is how she is felt before.  Reports that she tolerates the medication well.  She is able to complete all ADLs independently.  She operates a Librarian, academic without difficulty.  She returns today for an evaluation.     REVIEW OF SYSTEMS: Out of a complete 14 system review of symptoms, the patient complains only of the following symptoms, and all other  reviewed systems are negative.  See HPI  ALLERGIES: Allergies  Allergen Reactions   Ibuprofen Other (See Comments)    Can't take due to gastric surgery.    Tramadol Other (See Comments)    Can't take this medication with seizure medication.    Flexeril [Cyclobenzaprine] Anxiety    HOME MEDICATIONS: Outpatient Medications Prior to Visit  Medication Sig Dispense Refill   divalproex (DEPAKOTE ER) 500 MG 24 hr tablet TAKE 5 TABLETS (2500 MG TOTAL) BY MOUTH DAILY 450 tablet 4   No facility-administered medications prior to visit.    PAST MEDICAL HISTORY: Past Medical History:  Diagnosis Date   Ankle fracture    Anxiety    Blood transfusion without reported diagnosis    Gastric bypass status for obesity    Hypertension    Mood swings     Seizures (HCC)    Last seizure 8/15    PAST SURGICAL HISTORY: Past Surgical History:  Procedure Laterality Date   ANKLE CLOSED REDUCTION Right 06/10/2014   Procedure: CLOSED REDUCTION AND SPLINTING RIGHT TALUS FRACTURE DISLOCATION;  Surgeon: Budd Palmer, MD;  Location: [facility] OR;  Service: Orthopedics;  Laterality: Right;   CAST APPLICATION Right 06/24/2014   Procedure: CAST APPLICATION;  Surgeon: Budd Palmer, MD;  Location: [facility] OR;  Service: Orthopedics;  Laterality: Right;   DILATION AND CURETTAGE OF UTERUS  09/2018   retained products of conception   EXTERNAL FIXATION LEG Left 06/10/2014   Procedure: EXTERNAL FIXATION LEFT PILON FRACTURE;  Surgeon: Budd Palmer, MD;  Location: [facility] OR;  Service: Orthopedics;  Laterality: Left;   GASTRIC BYPASS     I & D EXTREMITY Left 06/10/2014   Procedure: IRRIGATION AND DEBRIDEMENT LEFT OPEN FRACTURE; INCLUDING BONE;  Surgeon: Budd Palmer, MD;  Location: [facility] OR;  Service: Orthopedics;  Laterality: Left;   ORIF ANKLE FRACTURE Left 06/24/2014   Procedure: OPEN REDUCTION INTERNAL FIXATION (ORIF) TIBIA PILON  FRACTURE WITH REMOVAL OF EXTERNAL FIXATURE ;  Surgeon: Budd Palmer, MD;  Location: [facility] OR;  Service: Orthopedics;  Laterality: Left;   ORIF FIBULA FRACTURE Left 06/10/2014   Procedure: OPEN REDUCTION INTERNAL FIXATION (ORIF) PILON FRACTURE, FIBULA ONLY;  Surgeon: Budd Palmer, MD;  Location: [facility] OR;  Service: Orthopedics;  Laterality: Left;   WISDOM TOOTH EXTRACTION      FAMILY HISTORY: Family History  Problem Relation Age of Onset   High blood pressure Mother    High Cholesterol Mother    Stroke Father    High blood pressure Father    Hypertension Sister    Seizures Maternal Grandmother     SOCIAL HISTORY: Social History   Socioeconomic History   Marital status: Single    Spouse name: Not on file   Number of children: 3   Years of education: 12   Highest education level: GED or equivalent  Occupational History    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 Patient is divorcing. Patient works full time at [employer].    Education high school.   Right handed.   Caffeine one cup of coffee daily.   Social Drivers of Corporate investment banker Strain: High Risk (12/31/2023)   Overall Financial Resource Strain (CARDIA)    Difficulty of Paying Living Expenses: Very hard  Food Insecurity: Food Insecurity Present (12/31/2023)   Hunger Vital Sign    Worried About Running Out of Food in the Last Year: Often true    Ran Out of Food in the Last Year: Often true  Transportation Needs: No Transportation Needs (12/31/2023)   PRAPARE - Administrator, Civil Service (Medical): No    Lack of Transportation (Non-Medical): No  Physical Activity: Unknown (12/31/2023)   Exercise Vital Sign    Days of Exercise per Week: 0 days    Minutes of Exercise per Session: Not on file  Stress: Stress Concern Present (12/31/2023)   [employer] of Occupational Health - Occupational Stress Questionnaire    Feeling of Stress : Rather much  Social Connections: Socially Isolated (12/31/2023)   Social Connection and Isolation Panel [NHANES]    Frequency of Communication with Friends and Family: Once a week    Frequency of Social Gatherings with Friends and Family: Once a week    Attends Religious Services: Never    Database administrator or Organizations: No    Attends Engineer, structural: Not on file    Marital Status: Living with partner  Intimate Partner  Violence: Not on file      PHYSICAL EXAM  There were no vitals filed for this visit.    There is no height or weight on file to calculate BMI.  Generalized: Well developed, in no acute distress   Neurological examination  Mentation: Alert oriented to time, place, history taking. Follows all commands speech and language fluent Cranial nerve II-XII: Pupils were equal round reactive to light. Extraocular movements were full, visual field were full on confrontational test. Head turning and shoulder shrug  were normal and symmetric. Motor: The motor testing reveals 5 over 5 strength of all 4 extremities. Good symmetric motor tone is noted throughout.  Sensory: Sensory testing is intact to soft touch on all 4 extremities. No evidence of extinction is noted.  Coordination: Cerebellar testing reveals good finger-nose-finger and heel-to-shin bilaterally.  Gait and station: Gait is normal. Reflexes: Deep tendon reflexes are symmetric and normal bilaterally.   DIAGNOSTIC DATA (LABS, IMAGING, TESTING) - I reviewed patient records, labs, notes, testing and imaging myself where available.  Lab Results  Component Value Date   WBC 4.5 06/10/2023   HGB 12.2 06/10/2023   HCT 37.9 06/10/2023   MCV 95 06/10/2023   PLT 213 06/10/2023      Component Value Date/Time   NA 135 06/10/2023 1320   K 4.5 06/10/2023 1320   CL 100 06/10/2023 1320   CO2 23 06/10/2023 1320   GLUCOSE 86 06/10/2023 1320   GLUCOSE 82 08/03/2019 1521   BUN 11 06/10/2023 1320   CREATININE 0.99 06/10/2023 1320   CREATININE 0.74 08/03/2019 1521   CALCIUM 8.9 06/10/2023 1320   PROT 6.5 07/22/2023 1026   ALBUMIN 3.7 (L) 07/22/2023 1026   AST 52 (H) 07/22/2023 1026   AST 15 08/03/2019 1521   ALT 32 07/22/2023 1026   ALT 9 08/03/2019 1521   ALKPHOS 120 07/22/2023 1026   BILITOT 0.6 07/22/2023 1026   BILITOT 0.3 08/03/2019 1521  GFRNONAA 93 11/25/2019 1336   GFRNONAA >60 08/03/2019 1521   GFRAA 108 11/25/2019 1336    GFRAA >60 08/03/2019 1521      ASSESSMENT AND PLAN 48 y.o. year old female  has a past medical history of Ankle fracture, Anxiety, Blood transfusion without reported diagnosis, Gastric bypass status for obesity, Hypertension, Mood swings, and Seizures (HCC). here with:  1.  Seizures  -Continue Depakote extended release 2500 mg daily -Blood work today, CBC, CMP and Depakote level -Advised if she has any seizure event she should let us know  2. Sweating/shaky/weakness  -These events could be related to her blood sugar as she typically improves with food or liquid? -Patient was advised to schedule a follow-up on appointment with her PCP to discuss  -Follow-up in 6-7 months or sooner if needed   Butch Penny, MSN, NP-C 01/14/2024, 6:15 PM [facility] [address], [address] [place], [place] [address] [phone]

## 2024-01-15 ENCOUNTER — Ambulatory Visit: Admitting: Adult Health

## 2024-01-15 ENCOUNTER — Telehealth: Payer: Self-pay | Admitting: Adult Health

## 2024-01-15 VITALS — BP 128/83 | HR 89 | Ht 64.0 in | Wt 133.8 lb

## 2024-01-15 DIAGNOSIS — G40909 Epilepsy, unspecified, not intractable, without status epilepticus: Secondary | ICD-10-CM

## 2024-01-15 DIAGNOSIS — Z5181 Encounter for therapeutic drug level monitoring: Secondary | ICD-10-CM | POA: Diagnosis not present

## 2024-01-15 NOTE — Telephone Encounter (Signed)
Ok to stay with me

## 2024-01-15 NOTE — Telephone Encounter (Signed)
 Megan's check out note stated for patient to see Dr. Terrace Arabia in six months. Patient stated she did not feel comfortable seeing Dr. Terrace Arabia and said she would like to see Aundra Millet again. Megan, I told her I would let you know to see if it's okay to stay with you or should she see another provider?

## 2024-01-15 NOTE — Patient Instructions (Signed)
 Your Plan:  Continue Depakote extended release 2500 mg daily Blood work today Make sure you are eating meals regularly.  Do not skip meals.  Make sure you stay well-hydrated with water and electrolyte drinks     Thank you for coming to see [place] at [facility]. I hope we have been able to provide you high quality care today.  You may receive a patient satisfaction survey over the next few weeks. We would appreciate your feedback and comments so that we may continue to improve ourselves and the health of our patients.

## 2024-01-16 LAB — CBC WITH DIFFERENTIAL/PLATELET
Basophils Absolute: 0.1 10*3/uL (ref 0.0–0.2)
Basos: 1 %
EOS (ABSOLUTE): 0.2 10*3/uL (ref 0.0–0.4)
Eos: 4 %
Hematocrit: 28.8 % — ABNORMAL LOW (ref 34.0–46.6)
Hemoglobin: 9.1 g/dL — ABNORMAL LOW (ref 11.1–15.9)
Immature Grans (Abs): 0 10*3/uL (ref 0.0–0.1)
Immature Granulocytes: 0 %
Lymphocytes Absolute: 1.4 10*3/uL (ref 0.7–3.1)
Lymphs: 33 %
MCH: 30.6 pg (ref 26.6–33.0)
MCHC: 31.6 g/dL (ref 31.5–35.7)
MCV: 97 fL (ref 79–97)
Monocytes Absolute: 0.4 10*3/uL (ref 0.1–0.9)
Monocytes: 9 %
Neutrophils Absolute: 2.3 10*3/uL (ref 1.4–7.0)
Neutrophils: 53 %
Platelets: 228 10*3/uL (ref 150–450)
RBC: 2.97 x10E6/uL — ABNORMAL LOW (ref 3.77–5.28)
RDW: 13.2 % (ref 11.7–15.4)
WBC: 4.2 10*3/uL (ref 3.4–10.8)

## 2024-01-16 LAB — COMPREHENSIVE METABOLIC PANEL
ALT: 16 IU/L (ref 0–32)
AST: 23 IU/L (ref 0–40)
Albumin: 4.3 g/dL (ref 3.9–4.9)
Alkaline Phosphatase: 105 IU/L (ref 44–121)
BUN/Creatinine Ratio: 15 (ref 9–23)
BUN: 14 mg/dL (ref 6–24)
Bilirubin Total: 0.4 mg/dL (ref 0.0–1.2)
CO2: 20 mmol/L (ref 20–29)
Calcium: 8.9 mg/dL (ref 8.7–10.2)
Chloride: 105 mmol/L (ref 96–106)
Creatinine, Ser: 0.96 mg/dL (ref 0.57–1.00)
Globulin, Total: 2.7 g/dL (ref 1.5–4.5)
Glucose: 86 mg/dL (ref 70–99)
Potassium: 3.9 mmol/L (ref 3.5–5.2)
Sodium: 138 mmol/L (ref 134–144)
Total Protein: 7 g/dL (ref 6.0–8.5)
eGFR: 74 mL/min/{1.73_m2} (ref >59)

## 2024-01-16 LAB — VALPROIC ACID LEVEL: Valproic Acid Lvl: 54 ug/mL (ref 50–100)

## 2024-01-21 ENCOUNTER — Telehealth (INDEPENDENT_AMBULATORY_CARE_PROVIDER_SITE_OTHER): Payer: Self-pay | Admitting: Primary Care

## 2024-01-21 NOTE — Telephone Encounter (Signed)
 Spoke to pt about appt.. Will be present

## 2024-01-28 ENCOUNTER — Telehealth: Payer: Self-pay | Admitting: *Deleted

## 2024-01-28 ENCOUNTER — Encounter: Payer: Self-pay | Admitting: *Deleted

## 2024-01-28 NOTE — Telephone Encounter (Signed)
error 

## 2024-01-28 NOTE — Telephone Encounter (Deleted)
-----   Message from [facility] sent at 01/16/2024  3:48 PM EDT ----- Please call patient and let her know that her red blood cell hemoglobin hematocrit has decreased.  Anemia?  she will need to follow-up with her PCP.  Please make sure PCP gets results

## 2024-01-28 NOTE — Telephone Encounter (Signed)
-----   Message from [facility] sent at 01/16/2024  3:48 PM EDT ----- Please call patient and let her know that her red blood cell hemoglobin hematocrit has decreased.  Anemia?  she will need to follow-up with her PCP.  Please make sure PCP gets results

## 2024-01-28 NOTE — Telephone Encounter (Signed)
 Spoke with pt and discussed lab results as noted below by [facility] NP. Pt states she has had low iron levels for years. She has h/o iron infusions at cancer center but states she stopped them because they didn't help. She will follow-up with PCP. She was supposed to have an appt tomorrow but she has to r/s. I told her the lab results were being sent to Gwinda Passe NP as well. She thanked me for the call.

## 2024-01-29 ENCOUNTER — Ambulatory Visit (INDEPENDENT_AMBULATORY_CARE_PROVIDER_SITE_OTHER): Admitting: Primary Care

## 2024-02-01 NOTE — Progress Notes (Signed)
 Chart reviewed, agree above plan ?

## 2024-02-05 ENCOUNTER — Ambulatory Visit: Admitting: Physician Assistant

## 2024-02-10 ENCOUNTER — Ambulatory Visit (INDEPENDENT_AMBULATORY_CARE_PROVIDER_SITE_OTHER): Admitting: Primary Care

## 2024-02-25 ENCOUNTER — Encounter: Payer: Self-pay | Admitting: Adult Health

## 2024-02-25 ENCOUNTER — Telehealth: Payer: Self-pay | Admitting: Adult Health

## 2024-02-25 NOTE — Telephone Encounter (Signed)
 Unable to LVM, VM box full. Sent mychart msg and letter in mail informing pt of need to reschedule 07/29/24 appt - NP out

## 2024-06-11 ENCOUNTER — Telehealth: Payer: Self-pay | Admitting: Adult Health

## 2024-06-11 NOTE — Telephone Encounter (Signed)
 Pt understands that although there may be some limitations with this type of visit, we will take all precautions to reduce any security or privacy concerns.  Pt understands that this will be treated like an in office visit and we will file with pt's insurance, and there may be a patient responsible charge related to this service.  Pt has r/s her 6 mo f/u

## 2024-07-29 ENCOUNTER — Ambulatory Visit: Admitting: Adult Health

## 2024-08-03 NOTE — Progress Notes (Unsigned)
 PATIENT: Isabella Jenkins DOB: 03/09/1976  REASON FOR VISIT: follow up HISTORY FROM: patient  Virtual Visit via Video Note  I connected with Isabella Jenkins on 08/03/24 at  8:45 AM EDT by a video enabled telemedicine application located remotely at [facility]*** and verified that I am speaking with the correct person using two identifiers who was located at their own ***   I discussed the limitations of evaluation and management by telemedicine and the availability of in person appointments. The patient expressed understanding and agreed to proceed.   PATIENT: [name] DOB: September 16, 1976  REASON FOR VISIT: follow up HISTORY FROM: patient  HISTORY OF PRESENT ILLNESS: Today   HISTORY Today 01/14/24:   Isabella Jenkins is a 48 y.o. female with a history of seizures. Returns today for follow-up.  She denies any seizure events.  She states in the past her seizures have presented with loss of consciousness shaking in all extremities loss of bladder and biting her tongue.  She states that she continues to have episodes where she will feel that her hands are trembling, leg start to feel weak she starts sweating.  She states that typically she can eat something and the symptoms are resolved.  During these events she is completely aware.  She has had gastric bypass and 100 pound weight loss.  She states that she drinks 6-8 beers a week.  On occasion she may smoke marijuana denies any other recreational drug use.   EEG May 04, 2014 CONCLUSION: This is an abnormal EEG.  There is electrodiagnostic evidence of generalized epileptiform discharges.   06/10/23:Isabella Jenkins is a 48 y.o. female with a history of Seizures . Returns today for follow-up.  She denies any seizure events.  Continues on Depakote  2500 mg daily.  She does state that she has been having episodes several times a week where she will break out in a sweat feel shaky and legs feel weak.  She states that  typically she can eat or drink something and it slowly improves.  She has not seen her primary care since 2020. These episodes only last about 5 minutes.    06/14/22: Isabella Jenkins is a 48 year old female with a history of seizures.  She returns today for follow-up.  She denies any seizure events.  Continues on Depakote  2500 mg daily.  No change in her gait or balance.  Able to complete all ADLs independently.  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 She returns today for evaluation.   06/14/21: Isabella Jenkins is a 48 year old female with a history of seizures.  She returns today for follow-up.  The patient is currently taking Depakote  2500 mg daily.  She denies any seizure events.  Reports that she is tolerating her medication well.  No change in her gait or balance.  She does report in June her 73-year-old daughter contracted meningitis and passed away.  She returns today for an evaluation.     11/25/19: Isabella Jenkins is a 48 year old female with a history of seizures.  She returns today for follow-up.  At the last visit Depakote  was increased to 2500 milligrams daily.  She reports that she has had 2 seizures in the last month.  They have both occurred at night.  She states that she woke up around 3 AM to go the bathroom.  She came back and got the bed.  She woke up around 5 AM.  She felt spacey, she saw that stuff was off her dresser and on  the floor.  She had a scrape on her arm.  She had urinated in the bed.  And she states that she felt weak.  She states that she drinks 3-6 beers a night.  She returns today for an evaluation   HISTORY  08/31/19: Isabella Jenkins is a 48 year old female with a history of seizures.  She returns today for follow-up.  She is currently on Depakote  2000 mg extended release daily.  She reports that she had a mild seizure on Friday.  She states that she was up Thursday night with diarrhea.  She reports that she is lactose intolerance and had dairy product.  She states in the past she  is only had a seizure if she missed her medication.  She states that the seizure was not witnessed but when she woke up she was in her bed and her muscles were sore and tight.  She did not bite her gum or tongue.  No loss of urine.  She states that she did feel like she had a seizure as this is how she is felt before.  Reports that she tolerates the medication well.  She is able to complete all ADLs independently.  She operates a Librarian, academic without difficulty.  She returns today for an evaluation.     REVIEW OF SYSTEMS: Out of a complete 14 system review of symptoms, the patient complains only of the following symptoms, and all other reviewed systems are negative.  ALLERGIES: Allergies  Allergen Reactions   Ibuprofen  Other (See Comments)    Can't take due to gastric surgery.    Tramadol Other (See Comments)    Can't take this medication with seizure medication.    Flexeril  [Cyclobenzaprine ] Anxiety    HOME MEDICATIONS: Outpatient Medications Prior to Visit  Medication Sig Dispense Refill   divalproex  (DEPAKOTE  ER) 500 MG 24 hr tablet TAKE 5 TABLETS (2500 MG TOTAL) BY MOUTH DAILY 450 tablet 4   No facility-administered medications prior to visit.    PAST MEDICAL HISTORY: Past Medical History:  Diagnosis Date   Ankle fracture    Anxiety    Blood transfusion without reported diagnosis    Gastric bypass status for obesity    Hypertension    Mood swings    Seizures (HCC)    Last seizure 8/15    PAST SURGICAL HISTORY: Past Surgical History:  Procedure Laterality Date   ANKLE CLOSED REDUCTION Right 06/10/2014   Procedure: CLOSED REDUCTION AND SPLINTING RIGHT TALUS FRACTURE DISLOCATION;  Surgeon: Ozell VEAR Bruch, MD;  Location: [facility] OR;  Service: Orthopedics;  Laterality: Right;   CAST APPLICATION Right 06/24/2014   Procedure: CAST APPLICATION;  Surgeon: Ozell VEAR Bruch, MD;  Location: [facility] OR;  Service: Orthopedics;  Laterality: Right;   DILATION AND CURETTAGE OF UTERUS  09/2018    retained products of conception   EXTERNAL FIXATION LEG Left 06/10/2014   Procedure: EXTERNAL FIXATION LEFT PILON FRACTURE;  Surgeon: Ozell VEAR Bruch, MD;  Location: [facility] OR;  Service: Orthopedics;  Laterality: Left;   GASTRIC BYPASS     I & D EXTREMITY Left 06/10/2014   Procedure: IRRIGATION AND DEBRIDEMENT LEFT OPEN FRACTURE; INCLUDING BONE;  Surgeon: Ozell VEAR Bruch, MD;  Location: [facility] OR;  Service: Orthopedics;  Laterality: Left;   ORIF ANKLE FRACTURE Left 06/24/2014   Procedure: OPEN REDUCTION INTERNAL FIXATION (ORIF) TIBIA PILON  FRACTURE WITH REMOVAL OF EXTERNAL FIXATURE ;  Surgeon: Ozell VEAR Bruch, MD;  Location: [facility] OR;  Service: Orthopedics;  Laterality: Left;  ORIF FIBULA FRACTURE Left 06/10/2014   Procedure: OPEN REDUCTION INTERNAL FIXATION (ORIF) PILON FRACTURE, FIBULA ONLY;  Surgeon: Ozell VEAR Bruch, MD;  Location: [facility] OR;  Service: Orthopedics;  Laterality: Left;   WISDOM TOOTH EXTRACTION      FAMILY HISTORY: Family History  Problem Relation Age of Onset   High blood pressure Mother    High Cholesterol Mother    Stroke Father    High blood pressure Father    Hypertension Sister    Seizures Maternal Grandmother     SOCIAL HISTORY: Social History   Socioeconomic History   Marital status: Single    Spouse name: Not on file   Number of children: 3   Years of education: 12   Highest education level: GED or equivalent  Occupational History   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 Patient is divorcing. Patient works  full time at [employer].    Education high school.   Right handed.   Caffeine one cup of coffee daily.   Social Drivers of Corporate investment banker Strain: High Risk (12/31/2023)   Overall Financial Resource Strain (CARDIA)    Difficulty of Paying Living Expenses: Very hard  Food Insecurity: Food Insecurity Present (12/31/2023)   Hunger Vital Sign    Worried About Running Out of Food in the Last Year: Often true    Ran Out of Food in the Last Year: Often true  Transportation Needs: No Transportation Needs (12/31/2023)   PRAPARE - Administrator, Civil Service (Medical): No    Lack of Transportation (Non-Medical): No  Physical Activity: Unknown (12/31/2023)   Exercise Vital Sign    Days of Exercise per Week: 0 days    Minutes of Exercise per Session: Not on file  Stress: Stress Concern Present (12/31/2023)   [employer] of Occupational Health - Occupational Stress Questionnaire    Feeling of Stress : Rather much  Social Connections: Socially Isolated (12/31/2023)   Social Connection and Isolation Panel    Frequency of Communication with Friends and Family: Once a week    Frequency of Social Gatherings with Friends and Family: Once a week    Attends Religious Services: Never    Database administrator or Organizations: No    Attends Engineer, structural: Not on file    Marital Status: Living with partner  Intimate Partner Violence: Not on file      PHYSICAL EXAM Generalized: Well developed, in no acute distress   Neurological examination  Mentation: Alert oriented to time, place, history taking. Follows all commands speech and language fluent Cranial nerve II-XII: Facial symmetry noted.   DIAGNOSTIC DATA (LABS, IMAGING, TESTING) - I reviewed patient records, labs, notes, testing and imaging myself where available.  Lab Results  Component Value Date   WBC 4.2 01/15/2024   HGB 9.1 (L) 01/15/2024   HCT 28.8 (L) 01/15/2024   MCV 97 01/15/2024  PLT 228  01/15/2024      Component Value Date/Time   NA 138 01/15/2024 1600   K 3.9 01/15/2024 1600   CL 105 01/15/2024 1600   CO2 20 01/15/2024 1600   GLUCOSE 86 01/15/2024 1600   GLUCOSE 82 08/03/2019 1521   BUN 14 01/15/2024 1600   CREATININE 0.96 01/15/2024 1600   CREATININE 0.74 08/03/2019 1521   CALCIUM 8.9 01/15/2024 1600   PROT 7.0 01/15/2024 1600   ALBUMIN 4.3 01/15/2024 1600   AST 23 01/15/2024 1600   AST 15 08/03/2019 1521   ALT 16 01/15/2024 1600   ALT 9 08/03/2019 1521   ALKPHOS 105 01/15/2024 1600   BILITOT 0.4 01/15/2024 1600   BILITOT 0.3 08/03/2019 1521   GFRNONAA 93 11/25/2019 1336   GFRNONAA >60 08/03/2019 1521   GFRAA 108 11/25/2019 1336   GFRAA >60 08/03/2019 1521   No results found for: CHOL, HDL, LDLCALC, LDLDIRECT, TRIG, CHOLHDL Lab Results  Component Value Date   HGBA1C 4.9 03/04/2014   Lab Results  Component Value Date   VITAMINB12 183 08/03/2019   No results found for: TSH    ASSESSMENT AND PLAN 48 y.o. year old female  has a past medical history of Ankle fracture, Anxiety, Blood transfusion without reported diagnosis, Gastric bypass status for obesity, Hypertension, Mood swings, and Seizures (HCC). here with ***    * No order type specified * No orders of the defined types were placed in this encounter.    Duwaine Russell, MSN, NP-C 08/03/2024, 3:20 PM [facility] [address] [place], [place] [address] [phone]

## 2024-08-04 ENCOUNTER — Encounter: Admitting: Adult Health

## 2024-08-04 NOTE — Progress Notes (Signed)
 This encounter was created in error - please disregard.

## 2024-08-18 ENCOUNTER — Telehealth: Payer: Self-pay | Admitting: Adult Health

## 2024-08-18 MED ORDER — DIVALPROEX SODIUM ER 500 MG PO TB24: ORAL_TABLET | ORAL | 0 refills | Status: DC

## 2024-08-18 NOTE — Telephone Encounter (Signed)
 Pt called stating she has not heard back from MD   about rescheduling appt and still need  Medication refill  divalproex  (DEPAKOTE  ER) 500 MG 24 hr tablet   Pt medication  is to be sent to   [id number] - [place], [place] - [address] (Ph: [phone])

## 2024-08-20 NOTE — Telephone Encounter (Signed)
 Clifford thanks

## 2024-08-20 NOTE — Telephone Encounter (Signed)
 Patient had returned back to work that day so she cannot stay on for a virtual visit.  We will need to get her rescheduled.  We can try to fit her back in

## 2024-09-15 ENCOUNTER — Other Ambulatory Visit: Payer: Self-pay | Admitting: Adult Health

## 2024-09-29 ENCOUNTER — Telehealth: Admitting: Adult Health

## 2024-10-13 ENCOUNTER — Telehealth: Payer: Self-pay | Admitting: Adult Health

## 2024-10-13 NOTE — Telephone Encounter (Signed)
 I called pt.  She said that she had a seizure during her sleep yesterday.  Her work is asking for a letter for her to be able to go back to work.  (She works at [employer]). She is supposed to work today,  It can placed on mychart.  She said that it was mild, her husband witnessed it. No incontinence.  She said she may have missed her dose of medication (she was not sure).  No other triggers that she is aware.  No drinking or recreational drugs, not sleep deprived, not ill, although did have headache 2 days prior took tylenol  otc and went away.  She was last seen 07/2024 mychart.  Needs follow up scheduled.

## 2024-10-13 NOTE — Telephone Encounter (Signed)
 I called pt and told her that provider needs to see her in office.  No letter at this time.  She took the appt.

## 2024-10-13 NOTE — Telephone Encounter (Signed)
 Pt called and stated that she had a seizure yesterday but did not go to the hospital or work. Pt states she is needing a letter for work in order for her to be able to go back. Please advise.

## 2024-10-13 NOTE — Telephone Encounter (Signed)
 I haven't seen patient since March. She was suppose to have Virtual visit in October but had to reschedule. So no showed appt 12/2. Can we get her in for an appointment with me or MD.

## 2024-10-15 ENCOUNTER — Ambulatory Visit: Admitting: Adult Health

## 2024-10-15 ENCOUNTER — Encounter: Payer: Self-pay | Admitting: Adult Health

## 2024-10-15 VITALS — BP 123/93 | HR 100 | Ht 64.0 in | Wt 121.2 lb

## 2024-10-15 DIAGNOSIS — G40909 Epilepsy, unspecified, not intractable, without status epilepticus: Secondary | ICD-10-CM

## 2024-10-15 DIAGNOSIS — Z5181 Encounter for therapeutic drug level monitoring: Secondary | ICD-10-CM

## 2024-10-15 NOTE — Patient Instructions (Signed)
 Your Plan:  Continue Depakote  ER 2500 mg daily  Blood work today No driving until seizure free for 6 months If your symptoms worsen or you develop new symptoms please let us  know.   Thank you for coming to see us  at [facility]. I hope we have been able to provide you high quality care today.  You may receive a patient satisfaction survey over the next few weeks. We would appreciate your feedback and comments so that we may continue to improve ourselves and the health of our patients.     SEIZURE PRECAUTIONS Per [place]  DMV statutes, patients with seizures are not allowed to drive until they have been seizure-free for six months.    Use caution when using heavy equipment or power tools. Avoid working on ladders or at heights. Take showers instead of baths. Ensure the water temperature is not too high on the home water heater. Do not go swimming alone. Do not lock yourself in a room alone (i.e. bathroom). When caring for infants or small children, sit down when holding, feeding, or changing them to minimize risk of injury to the child in the event you have a seizure. Maintain good sleep hygiene. Avoid alcohol.    If patient has another seizure, call 911 and bring them back to the ED if: A.  The seizure lasts longer than 5 minutes.      B.  The patient doesn't wake shortly after the seizure or has new problems such as difficulty seeing, speaking or moving following the seizure C.  The patient was injured during the seizure D.  The patient has a temperature over 102 F (39C) E.  The patient vomited during the seizure and now is having trouble breathing

## 2024-10-15 NOTE — Progress Notes (Signed)
 PATIENT: Isabella Jenkins DOB: February 18, 1976  REASON FOR VISIT: follow up HISTORY FROM: patient Primary neurologist: Dr. Onita  Chief Complaint  Patient presents with   Follow-up    Patient in room 4, alone. Patient here for follow up, patient stated she had a mild seizure on Monday 12/15. Patients boyfriend stated the seizure wasn't long, and that she just shook a little and went back to sleep.       HISTORY OF PRESENT ILLNESS: Today 10/15/2024:  Isabella Jenkins is a 48 y.o. female with a history of seizures. Returns today for follow-up.  Patient is here today after having a seizure event.  Reports that she was asleep.  Her husband noticed that she made a noise and then he noticed some mild shaking lasting less than 1 minute.  No loss of bowel or bladder.  She did not bite her tongue.  She states that she woke up later on her own.  She states that she felt different but overall no additional symptoms.  She denies missing any medication.  Reports that she has been sleeping well.  Has not been sick.  Denies alcohol use and recreational drug use.  Does report that she has been under more stress.  Currently not driving.  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  She does not cook at [employer].  Returns today for follow-up.    01/15/24: Isabella Jenkins is a 48 y.o. female with a history of seizures. Returns today for follow-up.  She denies any seizure events.  She states in the past her seizures have presented with loss of consciousness shaking in all extremities loss of bladder and biting her tongue.  She states that she continues to have episodes where she will feel that her hands are trembling, leg start to feel weak she starts sweating.  She states that typically she can eat something and the symptoms are resolved.  During these events she is completely aware.  She has had gastric bypass and 100 pound weight loss.  She states that she drinks 6-8 beers a week.  On occasion she may smoke  marijuana denies any other recreational drug use.  EEG May 04, 2014 CONCLUSION: This is an abnormal EEG.  There is electrodiagnostic evidence of generalized epileptiform discharges.  06/10/23:Isabella Jenkins is a 48 y.o. female with a history of Seizures . Returns today for follow-up.  She denies any seizure events.  Continues on Depakote  2500 mg daily.  She does state that she has been having episodes several times a week where she will break out in a sweat feel shaky and legs feel weak.  She states that typically she can eat or drink something and it slowly improves.  She has not seen her primary care since 2020. These episodes only last about 5 minutes.   06/14/22: Isabella Jenkins is a 48 year old female with a history of seizures.  She returns today for follow-up.  She denies any seizure events.  Continues on Depakote  2500 mg daily.  No change in her gait or balance.  Able to complete all ADLs independently.  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 She returns today for evaluation.  06/14/21: Isabella Jenkins is a 48 year old female with a history of seizures.  She returns today for follow-up.  The patient is currently taking Depakote  2500 mg daily.  She denies any seizure events.  Reports that she is tolerating her medication well.  No change in her gait or balance.  She does report in  June her 23-year-old daughter contracted meningitis and passed away.  She returns today for an evaluation.   11/25/19: Isabella Jenkins is a 48 year old female with a history of seizures.  She returns today for follow-up.  At the last visit Depakote  was increased to 2500 milligrams daily.  She reports that she has had 2 seizures in the last month.  They have both occurred at night.  She states that she woke up around 3 AM to go the bathroom.  She came back and got the bed.  She woke up around 5 AM.  She felt spacey, she saw that stuff was off her dresser and on the floor.  She had a scrape on her arm.  She had urinated in the bed.   And she states that she felt weak.  She states that she drinks 3-6 beers a night.  She returns today for an evaluation  HISTORY  08/31/19: Isabella Jenkins is a 48 year old female with a history of seizures.  She returns today for follow-up.  She is currently on Depakote  2000 mg extended release daily.  She reports that she had a mild seizure on Friday.  She states that she was up Thursday night with diarrhea.  She reports that she is lactose intolerance and had dairy product.  She states in the past she is only had a seizure if she missed her medication.  She states that the seizure was not witnessed but when she woke up she was in her bed and her muscles were sore and tight.  She did not bite her gum or tongue.  No loss of urine.  She states that she did feel like she had a seizure as this is how she is felt before.  Reports that she tolerates the medication well.  She is able to complete all ADLs independently.  She operates a librarian, academic without difficulty.  She returns today for an evaluation.     REVIEW OF SYSTEMS: Out of a complete 14 system review of symptoms, the patient complains only of the following symptoms, and all other reviewed systems are negative.  See HPI  ALLERGIES: Allergies  Allergen Reactions   Ibuprofen  Other (See Comments)    Can't take due to gastric surgery.    Tramadol Other (See Comments)    Can't take this medication with seizure medication.    Flexeril  [Cyclobenzaprine ] Anxiety    HOME MEDICATIONS: Outpatient Medications Prior to Visit  Medication Sig Dispense Refill   divalproex  (DEPAKOTE  ER) 500 MG 24 hr tablet TAKE 5 TABLETS (2500 MG TOTAL) BY MOUTH DAILY 450 tablet 4   No facility-administered medications prior to visit.    PAST MEDICAL HISTORY: Past Medical History:  Diagnosis Date   Ankle fracture    Anxiety    Blood transfusion without reported diagnosis    Gastric bypass status for obesity    Hypertension    Mood swings    Seizures (HCC)     Last seizure 8/15    PAST SURGICAL HISTORY: Past Surgical History:  Procedure Laterality Date   ANKLE CLOSED REDUCTION Right 06/10/2014   Procedure: CLOSED REDUCTION AND SPLINTING RIGHT TALUS FRACTURE DISLOCATION;  Surgeon: Ozell VEAR Bruch, MD;  Location: [facility] OR;  Service: Orthopedics;  Laterality: Right;   CAST APPLICATION Right 06/24/2014   Procedure: CAST APPLICATION;  Surgeon: Ozell VEAR Bruch, MD;  Location: [facility] OR;  Service: Orthopedics;  Laterality: Right;   DILATION AND CURETTAGE OF UTERUS  09/2018   retained products of conception  EXTERNAL FIXATION LEG Left 06/10/2014   Procedure: EXTERNAL FIXATION LEFT PILON FRACTURE;  Surgeon: Ozell VEAR Bruch, MD;  Location: [facility] OR;  Service: Orthopedics;  Laterality: Left;   GASTRIC BYPASS     I & D EXTREMITY Left 06/10/2014   Procedure: IRRIGATION AND DEBRIDEMENT LEFT OPEN FRACTURE; INCLUDING BONE;  Surgeon: Ozell VEAR Bruch, MD;  Location: [facility] OR;  Service: Orthopedics;  Laterality: Left;   ORIF ANKLE FRACTURE Left 06/24/2014   Procedure: OPEN REDUCTION INTERNAL FIXATION (ORIF) TIBIA PILON  FRACTURE WITH REMOVAL OF EXTERNAL FIXATURE ;  Surgeon: Ozell VEAR Bruch, MD;  Location: [facility] OR;  Service: Orthopedics;  Laterality: Left;   ORIF FIBULA FRACTURE Left 06/10/2014   Procedure: OPEN REDUCTION INTERNAL FIXATION (ORIF) PILON FRACTURE, FIBULA ONLY;  Surgeon: Ozell VEAR Bruch, MD;  Location: [facility] OR;  Service: Orthopedics;  Laterality: Left;   WISDOM TOOTH EXTRACTION      FAMILY HISTORY: Family History  Problem Relation Age of Onset   High blood pressure Mother    High Cholesterol Mother    Stroke Father    High blood pressure Father    Hypertension Sister    Diabetes Sister    Heart attack Sister    Seizures Maternal Grandmother     SOCIAL HISTORY: Social History   Socioeconomic History   Marital status: Single    Spouse name: Not on file   Number of children: 3   Years of education: 12   Highest education level: GED or equivalent   Occupational History   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 Patient works full time at [employer]. Education high school.Right handed.Caffeine one cup of coffee daily.   Social Drivers of Health   Tobacco Use: High Risk (12/31/2023)   Patient History    Smoking Tobacco Use: Some Days    Smokeless Tobacco Use: Never    Passive Exposure: Not on file  Financial Resource Strain: High Risk (12/31/2023)   Overall Financial Resource Strain (CARDIA)    Difficulty of Paying Living Expenses: Very hard  Food Insecurity: Food Insecurity Present (12/31/2023)   Hunger Vital Sign    Worried About Running Out of Food in the Last Year: Often true    Ran Out of Food in the Last Year: Often true  Transportation Needs: No Transportation Needs (12/31/2023)   PRAPARE - Administrator, Civil Service (Medical): No    Lack of Transportation (Non-Medical): No  Physical Activity: Unknown (12/31/2023)   Exercise Vital Sign    Days of Exercise per Week: 0 days    Minutes of Exercise per Session: Not on file  Stress: Stress Concern Present (12/31/2023)   [employer] of Occupational Health - Occupational Stress Questionnaire    Feeling of Stress : Rather much  Social Connections: Socially Isolated (12/31/2023)   Social Connection and Isolation Panel    Frequency  of Communication with Friends and Family: Once a week    Frequency of Social Gatherings with Friends and Family:  Once a week    Attends Religious Services: Never    Database Administrator or Organizations: No    Attends Engineer, Structural: Not on file    Marital Status: Living with partner  Intimate Partner Violence: Not on file  Depression (PHQ2-9): High Risk (12/31/2023)   Depression (PHQ2-9)    PHQ-2 Score: 25  Alcohol Screen: Medium Risk (12/31/2023)   Alcohol Screen    Last Alcohol Screening Score (AUDIT): 10  Housing: High Risk (12/31/2023)   Housing Stability Vital Sign    Unable to Pay for Housing in the Last Year: Yes    Number of Times Moved in the Last Year: 0    Homeless in the Last Year: No  Utilities: Not on file  Health Literacy: Not on file      PHYSICAL EXAM  Vitals:   10/15/24 1442  BP: (!) 123/93  Pulse: 100  Weight: 121 lb 3.2 oz (55 kg)  Height: 5' 4 (1.626 m)      Body mass index is 20.8 kg/m.  Generalized: Well developed, in no acute distress   Neurological examination  Mentation: Alert oriented to time, place, history taking. Follows all commands speech and language fluent Cranial nerve II-XII: Pupils were equal round reactive to light. Extraocular movements were full, visual field were full on confrontational test. Head turning and shoulder shrug  were normal and symmetric. Motor: The motor testing reveals 5 over 5 strength of all 4 extremities. Good symmetric motor tone is noted throughout.  Sensory: Sensory testing is intact to soft touch on all 4 extremities. No evidence of extinction is noted.  Coordination: Cerebellar testing reveals good finger-nose-finger and heel-to-shin bilaterally.  Gait and station: Gait is normal. Reflexes: Deep tendon reflexes are symmetric and normal bilaterally.   DIAGNOSTIC DATA (LABS, IMAGING, TESTING) - I reviewed patient records, labs, notes, testing and imaging myself where available.  Lab Results  Component Value Date   WBC 4.2 01/15/2024   HGB 9.1 (L) 01/15/2024   HCT 28.8 (L) 01/15/2024   MCV 97  01/15/2024   PLT 228 01/15/2024      Component Value Date/Time   NA 138 01/15/2024 1600   K 3.9 01/15/2024 1600   CL 105 01/15/2024 1600   CO2 20 01/15/2024 1600   GLUCOSE 86 01/15/2024 1600   GLUCOSE 82 08/03/2019 1521   BUN 14 01/15/2024 1600   CREATININE 0.96 01/15/2024 1600   CREATININE 0.74 08/03/2019 1521   CALCIUM 8.9 01/15/2024 1600   PROT 7.0 01/15/2024 1600   ALBUMIN 4.3 01/15/2024 1600   AST 23 01/15/2024 1600   AST 15 08/03/2019 1521   ALT 16 01/15/2024 1600   ALT 9 08/03/2019 1521   ALKPHOS 105 01/15/2024 1600   BILITOT 0.4 01/15/2024 1600   BILITOT 0.3 08/03/2019 1521   GFRNONAA 93 11/25/2019 1336   GFRNONAA >60 08/03/2019 1521   GFRAA 108 11/25/2019 1336   GFRAA >60 08/03/2019 1521      ASSESSMENT AND PLAN 48 y.o. year old female  has a past medical history of Ankle fracture, Anxiety, Blood transfusion without reported diagnosis, Gastric bypass status for obesity, Hypertension, Mood swings, and Seizures (HCC). here with:  1.  Seizures  -Continue Depakote  extended release 2500 mg daily -Blood work today, CBC, CMP and Depakote  level -Advised if she has any seizure event she should let us   know - No Driving until seizure free for 6 months -Follow-up in 6-7 months or sooner if needed   Duwaine Russell, MSN, NP-C 10/15/2024, 2:48 PM [facility] [address], [address] [place], [place] [address] [phone]

## 2024-10-16 LAB — CBC WITH DIFFERENTIAL/PLATELET
Basophils Absolute: 0.1 x10E3/uL (ref 0.0–0.2)
Basos: 1 %
EOS (ABSOLUTE): 0.2 x10E3/uL (ref 0.0–0.4)
Eos: 4 %
Hematocrit: 33 % — ABNORMAL LOW (ref 34.0–46.6)
Hemoglobin: 10.3 g/dL — ABNORMAL LOW (ref 11.1–15.9)
Immature Grans (Abs): 0 x10E3/uL (ref 0.0–0.1)
Immature Granulocytes: 0 %
Lymphocytes Absolute: 1.9 x10E3/uL (ref 0.7–3.1)
Lymphs: 43 %
MCH: 29.7 pg (ref 26.6–33.0)
MCHC: 31.2 g/dL — ABNORMAL LOW (ref 31.5–35.7)
MCV: 95 fL (ref 79–97)
Monocytes Absolute: 0.4 x10E3/uL (ref 0.1–0.9)
Monocytes: 8 %
Neutrophils Absolute: 2 x10E3/uL (ref 1.4–7.0)
Neutrophils: 44 %
Platelets: 248 x10E3/uL (ref 150–450)
RBC: 3.47 x10E6/uL — ABNORMAL LOW (ref 3.77–5.28)
RDW: 15.9 % — ABNORMAL HIGH (ref 11.7–15.4)
WBC: 4.5 x10E3/uL (ref 3.4–10.8)

## 2024-10-16 LAB — COMPREHENSIVE METABOLIC PANEL WITH GFR
ALT: 22 IU/L (ref 0–32)
AST: 41 IU/L — ABNORMAL HIGH (ref 0–40)
Albumin: 3.9 g/dL (ref 3.9–4.9)
Alkaline Phosphatase: 110 IU/L (ref 41–116)
BUN/Creatinine Ratio: 14 (ref 9–23)
BUN: 11 mg/dL (ref 6–24)
Bilirubin Total: 0.6 mg/dL (ref 0.0–1.2)
CO2: 21 mmol/L (ref 20–29)
Calcium: 8.9 mg/dL (ref 8.7–10.2)
Chloride: 103 mmol/L (ref 96–106)
Creatinine, Ser: 0.76 mg/dL (ref 0.57–1.00)
Globulin, Total: 2.8 g/dL (ref 1.5–4.5)
Glucose: 82 mg/dL (ref 70–99)
Potassium: 4.2 mmol/L (ref 3.5–5.2)
Sodium: 138 mmol/L (ref 134–144)
Total Protein: 6.7 g/dL (ref 6.0–8.5)
eGFR: 98 mL/min/1.73

## 2024-10-16 LAB — VALPROIC ACID LEVEL: Valproic Acid Lvl: 70 ug/mL (ref 50–100)

## 2024-10-20 ENCOUNTER — Ambulatory Visit: Payer: Self-pay | Admitting: Adult Health

## 2024-10-20 NOTE — Telephone Encounter (Signed)
 I called patient.  Advised that I had consulted with Dr. Gregg who recommended that we start Lamictal  extended release.  I advised that I will send her some information on MyChart as she is nervous about starting a new medication.  She can read over the information about Lamictal  and the titration schedule.  She will message me with any questions or concerns.  I also reviewed potential side effects with her.

## 2024-10-21 MED ORDER — LAMOTRIGINE ER 25 MG PO TB24: ORAL_TABLET | ORAL | 0 refills | Status: AC

## 2024-10-21 NOTE — Addendum Note (Signed)
 Addended by: SHERRYL DUWAINE SQUIBB on: 10/21/2024 12:15 PM   Modules accepted: Orders

## 2025-04-14 ENCOUNTER — Ambulatory Visit: Admitting: Adult Health
